# Patient Record
Sex: Male | Born: 1945 | Race: White | Hispanic: No | Marital: Single | Smoking: Former smoker
Health system: Southern US, Community
[De-identification: ages and names within clinical notes are randomized; demographics above are authoritative.]

## PROBLEM LIST (undated history)

## (undated) DIAGNOSIS — G459 Transient cerebral ischemic attack, unspecified: Secondary | ICD-10-CM

## (undated) DIAGNOSIS — J449 Chronic obstructive pulmonary disease, unspecified: Secondary | ICD-10-CM

## (undated) DIAGNOSIS — M4320 Fusion of spine, site unspecified: Secondary | ICD-10-CM

## (undated) DIAGNOSIS — I10 Essential (primary) hypertension: Secondary | ICD-10-CM

## (undated) DIAGNOSIS — I4891 Unspecified atrial fibrillation: Secondary | ICD-10-CM

---

## 2003-02-04 ENCOUNTER — Ambulatory Visit (HOSPITAL_COMMUNITY): Admission: RE | Admit: 2003-02-04 | Discharge: 2003-02-04 | Payer: Self-pay | Admitting: Dental General Practice

## 2012-02-13 ENCOUNTER — Emergency Department: Payer: Self-pay | Admitting: Emergency Medicine

## 2012-02-13 LAB — BASIC METABOLIC PANEL
Anion Gap: 9 (ref 7–16)
BUN: 20 mg/dL — ABNORMAL HIGH (ref 7–18)
Calcium, Total: 9 mg/dL (ref 8.5–10.1)
Chloride: 103 mmol/L (ref 98–107)
Co2: 25 mmol/L (ref 21–32)
Creatinine: 1.12 mg/dL (ref 0.60–1.30)
EGFR (African American): >60
EGFR (Non-African Amer.): >60
Glucose: 161 mg/dL — ABNORMAL HIGH (ref 65–99)
Osmolality: 280 (ref 275–301)
Potassium: 4 mmol/L (ref 3.5–5.1)
Sodium: 137 mmol/L (ref 136–145)

## 2012-02-13 LAB — CBC
HCT: 40.9 % (ref 40.0–52.0)
HGB: 13.7 g/dL (ref 13.0–18.0)
MCH: 30.8 pg (ref 26.0–34.0)
MCHC: 33.6 g/dL (ref 32.0–36.0)
MCV: 92 fL (ref 80–100)
Platelet: 278 10*3/uL (ref 150–440)
RBC: 4.46 10*6/uL (ref 4.40–5.90)
RDW: 13.1 % (ref 11.5–14.5)
WBC: 12.8 10*3/uL — ABNORMAL HIGH (ref 3.8–10.6)

## 2012-02-13 LAB — RAPID INFLUENZA A&B ANTIGENS

## 2012-02-13 LAB — TROPONIN I: Troponin-I: <0.02 ng/mL

## 2012-02-19 LAB — CULTURE, BLOOD (SINGLE)

## 2014-12-24 DIAGNOSIS — Z23 Encounter for immunization: Secondary | ICD-10-CM | POA: Diagnosis not present

## 2015-12-31 DIAGNOSIS — Z23 Encounter for immunization: Secondary | ICD-10-CM | POA: Diagnosis not present

## 2016-03-24 DIAGNOSIS — D2271 Melanocytic nevi of right lower limb, including hip: Secondary | ICD-10-CM | POA: Diagnosis not present

## 2016-03-24 DIAGNOSIS — D485 Neoplasm of uncertain behavior of skin: Secondary | ICD-10-CM | POA: Diagnosis not present

## 2016-03-24 DIAGNOSIS — D2272 Melanocytic nevi of left lower limb, including hip: Secondary | ICD-10-CM | POA: Diagnosis not present

## 2016-03-24 DIAGNOSIS — L821 Other seborrheic keratosis: Secondary | ICD-10-CM | POA: Diagnosis not present

## 2016-03-24 DIAGNOSIS — D2261 Melanocytic nevi of right upper limb, including shoulder: Secondary | ICD-10-CM | POA: Diagnosis not present

## 2016-04-19 DIAGNOSIS — L578 Other skin changes due to chronic exposure to nonionizing radiation: Secondary | ICD-10-CM | POA: Diagnosis not present

## 2016-04-19 DIAGNOSIS — L814 Other melanin hyperpigmentation: Secondary | ICD-10-CM | POA: Diagnosis not present

## 2016-04-19 DIAGNOSIS — L908 Other atrophic disorders of skin: Secondary | ICD-10-CM | POA: Diagnosis not present

## 2016-04-19 DIAGNOSIS — C44329 Squamous cell carcinoma of skin of other parts of face: Secondary | ICD-10-CM | POA: Diagnosis not present

## 2016-08-12 DIAGNOSIS — Z85828 Personal history of other malignant neoplasm of skin: Secondary | ICD-10-CM | POA: Diagnosis not present

## 2016-08-12 DIAGNOSIS — X32XXXA Exposure to sunlight, initial encounter: Secondary | ICD-10-CM | POA: Diagnosis not present

## 2016-08-12 DIAGNOSIS — L570 Actinic keratosis: Secondary | ICD-10-CM | POA: Diagnosis not present

## 2016-08-12 DIAGNOSIS — Z08 Encounter for follow-up examination after completed treatment for malignant neoplasm: Secondary | ICD-10-CM | POA: Diagnosis not present

## 2016-08-12 DIAGNOSIS — L821 Other seborrheic keratosis: Secondary | ICD-10-CM | POA: Diagnosis not present

## 2016-12-06 DIAGNOSIS — Z23 Encounter for immunization: Secondary | ICD-10-CM | POA: Diagnosis not present

## 2017-04-13 DIAGNOSIS — K644 Residual hemorrhoidal skin tags: Secondary | ICD-10-CM | POA: Diagnosis not present

## 2017-04-13 DIAGNOSIS — K648 Other hemorrhoids: Secondary | ICD-10-CM | POA: Diagnosis not present

## 2017-04-19 DIAGNOSIS — K648 Other hemorrhoids: Secondary | ICD-10-CM | POA: Diagnosis not present

## 2017-04-19 DIAGNOSIS — Z1211 Encounter for screening for malignant neoplasm of colon: Secondary | ICD-10-CM | POA: Diagnosis not present

## 2017-04-24 DIAGNOSIS — B029 Zoster without complications: Secondary | ICD-10-CM | POA: Diagnosis not present

## 2017-04-24 DIAGNOSIS — Z85828 Personal history of other malignant neoplasm of skin: Secondary | ICD-10-CM | POA: Diagnosis not present

## 2017-04-24 DIAGNOSIS — L821 Other seborrheic keratosis: Secondary | ICD-10-CM | POA: Diagnosis not present

## 2017-04-24 DIAGNOSIS — L309 Dermatitis, unspecified: Secondary | ICD-10-CM | POA: Diagnosis not present

## 2017-05-26 DIAGNOSIS — L239 Allergic contact dermatitis, unspecified cause: Secondary | ICD-10-CM | POA: Diagnosis not present

## 2017-05-26 DIAGNOSIS — B029 Zoster without complications: Secondary | ICD-10-CM | POA: Diagnosis not present

## 2017-06-24 ENCOUNTER — Emergency Department
Admission: EM | Admit: 2017-06-24 | Discharge: 2017-06-24 | Disposition: A | Discharge status: Home / Self Care | Payer: Medicare Other | Attending: Emergency Medicine | Admitting: Emergency Medicine

## 2017-06-24 ENCOUNTER — Other Ambulatory Visit: Payer: Self-pay

## 2017-06-24 ENCOUNTER — Emergency Department: Payer: Medicare Other

## 2017-06-24 DIAGNOSIS — R531 Weakness: Secondary | ICD-10-CM | POA: Diagnosis not present

## 2017-06-24 DIAGNOSIS — J181 Lobar pneumonia, unspecified organism: Secondary | ICD-10-CM | POA: Insufficient documentation

## 2017-06-24 DIAGNOSIS — J189 Pneumonia, unspecified organism: Secondary | ICD-10-CM | POA: Diagnosis not present

## 2017-06-24 DIAGNOSIS — R509 Fever, unspecified: Secondary | ICD-10-CM | POA: Diagnosis not present

## 2017-06-24 LAB — BASIC METABOLIC PANEL
Anion gap: 6 (ref 5–15)
BUN: 17 mg/dL (ref 6–20)
CO2: 25 mmol/L (ref 22–32)
Calcium: 8.8 mg/dL — ABNORMAL LOW (ref 8.9–10.3)
Chloride: 105 mmol/L (ref 101–111)
Creatinine, Ser: 0.96 mg/dL (ref 0.61–1.24)
GFR calc Af Amer: >60 mL/min (ref >60)
GFR calc non Af Amer: >60 mL/min (ref >60)
Glucose, Bld: 114 mg/dL — ABNORMAL HIGH (ref 65–99)
Potassium: 3.6 mmol/L (ref 3.5–5.1)
Sodium: 136 mmol/L (ref 135–145)

## 2017-06-24 LAB — CBC
HCT: 40.7 % (ref 40.0–52.0)
Hemoglobin: 14.2 g/dL (ref 13.0–18.0)
MCH: 31.4 pg (ref 26.0–34.0)
MCHC: 35 g/dL (ref 32.0–36.0)
MCV: 89.9 fL (ref 80.0–100.0)
Platelets: 205 10*3/uL (ref 150–440)
RBC: 4.53 MIL/uL (ref 4.40–5.90)
RDW: 15.1 % — ABNORMAL HIGH (ref 11.5–14.5)
WBC: 11.9 10*3/uL — ABNORMAL HIGH (ref 3.8–10.6)

## 2017-06-24 LAB — TROPONIN I: Troponin I: <0.03 ng/mL (ref <0.03)

## 2017-06-24 MED ORDER — LEVOFLOXACIN 750 MG PO TABS
750.0000 mg | ORAL_TABLET | Freq: Once | ORAL | Status: AC
  Administered 2017-06-24: 750 mg via ORAL
  Filled 2017-06-24: qty 1

## 2017-06-24 MED ORDER — LEVOFLOXACIN 750 MG PO TABS: 750.0000 mg | ORAL_TABLET | Freq: Every day | ORAL | 0 refills | Status: AC

## 2017-06-24 NOTE — ED Provider Notes (Signed)
[facility] Emergency Department Provider Note   ____________________________________________    I have reviewed the triage vital signs and the nursing notes.   HISTORY  Chief Complaint Weakness and Fever     HPI Jacob Velasquez is a 72 y.o. male who presents with complaints of fever and left mid upper back pain.  Patient reports he is developed pain in his left mid upper back which is aching in nature, he reports this is in a similar location to when he had shingles several months ago.  Came to the emergency department today when he developed a fever of up to 103.  Denies shortness of breath.  No cough.  No dysuria.  No abdominal pain.  No rash.   No past medical history on file.  There are no active problems to display for this patient.     Prior to Admission medications   Medication Sig Start Date End Date Taking? Authorizing Provider  levofloxacin (LEVAQUIN) 750 MG tablet Take 1 tablet (750 mg total) by mouth daily for 7 days. 06/24/17 07/01/17  Lavonia Drafts, MD     Allergies Patient has no known allergies.  No family history on file.  Social History Social History   Tobacco Use  . Smoking status: Not on file  Substance Use Topics  . Alcohol use: Not on file  . Drug use: Not on file    Review of Systems  Constitutional: Positive fever Eyes: No visual changes.  ENT: No sore throat. Cardiovascular: Denies chest pain. Respiratory: Denies shortness of breath. Gastrointestinal: No abdominal pain.  No nausea, no vomiting.   Genitourinary: Negative for dysuria. Musculoskeletal: As above Skin: Negative for rash. Neurological: Negative for headaches    ____________________________________________   PHYSICAL EXAM:  VITAL SIGNS: ED Triage Vitals  Enc Vitals Group     BP 06/24/17 1931 (!) 174/77     Pulse Rate 06/24/17 1931 63     Resp 06/24/17 1931 18     Temp 06/24/17 1931 100 F (37.8 C)     Temp Source 06/24/17 1931  Oral     SpO2 06/24/17 1931 94 %     Weight 06/24/17 1932 95.3 kg (210 lb)     Height 06/24/17 1932 1.88 m (6\' 2" )     Head Circumference --      Peak Flow --      Pain Score --      Pain Loc --      Pain Edu? --      Excl. in [place]? --     Constitutional: Alert and oriented. No acute distress. Pleasant and interactive  Nose: No congestion/rhinnorhea. Mouth/Throat: Mucous membranes are moist.   Neck:  Painless ROM Cardiovascular: Normal rate, regular rhythm. Grossly normal heart sounds.  Good peripheral circulation. Respiratory: Normal respiratory effort.  No retractions. Lungs CTAB. Gastrointestinal: Soft and nontender. No distention.  No CVA tenderness. Genitourinary: deferred Musculoskeletal: Back: Normal exam, no tenderness no rash.  No vertebral tenderness to palpation .no lower extremity tenderness nor edema.  Warm and well perfused Neurologic:  Normal speech and language. No gross focal neurologic deficits are appreciated.  Skin:  Skin is warm, dry and intact. No rash noted. Psychiatric: Mood and affect are normal. Speech and behavior are normal.  ____________________________________________   LABS (all labs ordered are listed, but only abnormal results are displayed)  Labs Reviewed  BASIC METABOLIC PANEL - Abnormal; Notable for the following components:      Result Value  Glucose, Bld 114 (*)    Calcium 8.8 (*)    All other components within normal limits  CBC - Abnormal; Notable for the following components:   WBC 11.9 (*)    RDW 15.1 (*)    All other components within normal limits  URINE CULTURE  TROPONIN I   ____________________________________________  EKG  ED ECG REPORT I, Lavonia Drafts, the attending physician, personally viewed and interpreted this ECG.  Date: 06/24/2017  Rhythm: normal sinus rhythm QRS Axis: normal Intervals: normal ST/T Wave abnormalities: normal Narrative Interpretation: no evidence of acute  ischemia  ____________________________________________  RADIOLOGY  Chest x-ray ____________________________________________   PROCEDURES  Procedure(s) performed: No  Procedures   Critical Care performed: No ____________________________________________   INITIAL IMPRESSION / ASSESSMENT AND PLAN / ED COURSE  Pertinent labs & imaging results that were available during my care of the patient were reviewed by me and considered in my medical decision making (see chart for details).  Patient presents with fever, mild weakness and left upper back pain.  Differential includes pneumonia, UTI, no evidence of rash or reemergence of shingles.   ----------------------------------------- 9:45 PM on 06/24/2017 -----------------------------------------  Patient well-appearing in no acute distress, no tachycardia, oxygen saturations in room greater than 95%, no tachypnea.  Pneumonia on exam, left lower lobe, will treat with p.o. Levaquin, Rx.  Strict return precautions discussed with patient and son they agree with plan   ____________________________________________   FINAL CLINICAL IMPRESSION(S) / ED DIAGNOSES  Final diagnoses:  Community acquired pneumonia of left lower lobe of lung ([place])        Note:  This document was prepared using Dragon voice recognition software and may include unintentional dictation errors.    Lavonia Drafts, MD 06/24/17 2146

## 2017-06-24 NOTE — ED Notes (Signed)
Patient transported to XR. 

## 2017-06-24 NOTE — ED Notes (Signed)
ED Provider at bedside. 

## 2017-06-24 NOTE — ED Triage Notes (Signed)
Patient reports history of chronic neck and back pain.  Patient reports diagnosed with shingles on left back approximately 2 months ago (no obvious rash noted).  Reports having pain to left back, fever and generalized weakness.

## 2017-06-27 LAB — URINE CULTURE: Culture: 30000 — AB

## 2017-12-18 DIAGNOSIS — Z23 Encounter for immunization: Secondary | ICD-10-CM | POA: Diagnosis not present

## 2018-10-10 DIAGNOSIS — I10 Essential (primary) hypertension: Secondary | ICD-10-CM | POA: Diagnosis present

## 2019-07-09 ENCOUNTER — Encounter: Payer: Self-pay | Admitting: Intensive Care

## 2019-07-09 ENCOUNTER — Emergency Department
Admission: EM | Admit: 2019-07-09 | Discharge: 2019-07-09 | Disposition: A | Discharge status: Home / Self Care | Payer: Medicare Other | Attending: Emergency Medicine | Admitting: Emergency Medicine

## 2019-07-09 ENCOUNTER — Other Ambulatory Visit: Payer: Self-pay

## 2019-07-09 DIAGNOSIS — Y999 Unspecified external cause status: Secondary | ICD-10-CM | POA: Insufficient documentation

## 2019-07-09 DIAGNOSIS — W458XXA Other foreign body or object entering through skin, initial encounter: Secondary | ICD-10-CM | POA: Diagnosis not present

## 2019-07-09 DIAGNOSIS — Y92019 Unspecified place in single-family (private) house as the place of occurrence of the external cause: Secondary | ICD-10-CM | POA: Insufficient documentation

## 2019-07-09 DIAGNOSIS — F17210 Nicotine dependence, cigarettes, uncomplicated: Secondary | ICD-10-CM | POA: Diagnosis not present

## 2019-07-09 DIAGNOSIS — Y93E5 Activity, floor mopping and cleaning: Secondary | ICD-10-CM | POA: Diagnosis not present

## 2019-07-09 DIAGNOSIS — Z23 Encounter for immunization: Secondary | ICD-10-CM | POA: Insufficient documentation

## 2019-07-09 DIAGNOSIS — I10 Essential (primary) hypertension: Secondary | ICD-10-CM | POA: Insufficient documentation

## 2019-07-09 DIAGNOSIS — S60458A Superficial foreign body of other finger, initial encounter: Secondary | ICD-10-CM | POA: Insufficient documentation

## 2019-07-09 DIAGNOSIS — S6991XA Unspecified injury of right wrist, hand and finger(s), initial encounter: Secondary | ICD-10-CM

## 2019-07-09 HISTORY — DX: Essential (primary) hypertension: I10

## 2019-07-09 MED ORDER — AMOXICILLIN-POT CLAVULANATE 875-125 MG PO TABS: 1.0000 | ORAL_TABLET | Freq: Two times a day (BID) | ORAL | 0 refills | Status: DC

## 2019-07-09 MED ORDER — LIDOCAINE HCL (PF) 1 % IJ SOLN
10.0000 mL | Freq: Once | INTRAMUSCULAR | Status: AC
  Administered 2019-07-09: 10 mL
  Filled 2019-07-09: qty 10

## 2019-07-09 MED ORDER — TETANUS-DIPHTH-ACELL PERTUSSIS 5-2.5-18.5 LF-MCG/0.5 IM SUSP
0.5000 mL | Freq: Once | INTRAMUSCULAR | Status: AC
  Administered 2019-07-09: 0.5 mL via INTRAMUSCULAR
  Filled 2019-07-09: qty 0.5

## 2019-07-09 NOTE — ED Triage Notes (Signed)
Patient has fish hook stuck in right hand, thumb.

## 2019-07-09 NOTE — ED Provider Notes (Signed)
[facility] Emergency Department Provider Note  ____________________________________________  Time seen: Approximately 8:28 PM  I have reviewed the triage vital signs and the nursing notes.   HISTORY  Chief Complaint Foreign Body in Skin    HPI Jacob Velasquez is a 74 y.o. male who presents the emergency department complaining of embedded fishhook in his right thumb.  Patient states that he was cleaning up a topical box when he accidentally stuck his finger with one of his worst.  Patient was unable to remove it at home.  He is unsure of his last tetanus shot.  No other injury or complaint.  Good range of motion to the digit at this time.         Past Medical History:  Diagnosis Date  . Hypertension     There are no problems to display for this patient.   History reviewed. No pertinent surgical history.  Prior to Admission medications   Medication Sig Start Date End Date Taking? Authorizing Provider  amoxicillin-clavulanate (AUGMENTIN) 875-125 MG tablet Take 1 tablet by mouth 2 (two) times daily. 07/09/19   Sheria Rosello, Charline Bills, PA-C    Allergies Patient has no known allergies.  History reviewed. No pertinent family history.  Social History Social History   Tobacco Use  . Smoking status: Current Some Day Smoker    Types: Cigarettes  . Smokeless tobacco: Never Used  Substance Use Topics  . Alcohol use: Never  . Drug use: Never     Review of Systems  Constitutional: No fever/chills Eyes: No visual changes. No discharge ENT: No upper respiratory complaints. Cardiovascular: no chest pain. Respiratory: no cough. No SOB. Gastrointestinal: No abdominal pain.  No nausea, no vomiting.  No diarrhea.  No constipation. Musculoskeletal: Embedded fishhook in the right thumb Skin: Negative for rash, abrasions, lacerations, ecchymosis. Neurological: Negative for headaches, focal weakness or numbness. 10-point ROS otherwise  negative.  ____________________________________________   PHYSICAL EXAM:  VITAL SIGNS: ED Triage Vitals  Enc Vitals Group     BP 07/09/19 1849 (!) 190/89     Pulse Rate 07/09/19 1849 63     Resp 07/09/19 1849 16     Temp 07/09/19 1849 98.9 F (37.2 C)     Temp Source 07/09/19 1849 Oral     SpO2 07/09/19 1849 98 %     Weight 07/09/19 1851 220 lb (99.8 kg)     Height 07/09/19 1851 6\' 2"  (1.88 m)     Head Circumference --      Peak Flow --      Pain Score 07/09/19 1851 0     Pain Loc --      Pain Edu? --      Excl. in [place]? --      Constitutional: Alert and oriented. Well appearing and in no acute distress. Eyes: Conjunctivae are normal. PERRL. EOMI. Head: Atraumatic. ENT:      Ears:       Nose: No congestion/rhinnorhea.      Mouth/Throat: Mucous membranes are moist.  Neck: No stridor.    Cardiovascular: Normal rate, regular rhythm. Normal S1 and S2.  Good peripheral circulation. Respiratory: Normal respiratory effort without tachypnea or retractions. Lungs CTAB. Good air entry to the bases with no decreased or absent breath sounds. Musculoskeletal: Full range of motion to all extremities. No gross deformities appreciated.  Visualization of the right hand reveals embedded trihook fishhook in the right thumb.  Dried blood around the puncture wound.  Full range of motion.  Sensation and capillary refill intact. Neurologic:  Normal speech and language. No gross focal neurologic deficits are appreciated.  Skin:  Skin is warm, dry and intact. No rash noted. Psychiatric: Mood and affect are normal. Speech and behavior are normal. Patient exhibits appropriate insight and judgement.   ____________________________________________   LABS (all labs ordered are listed, but only abnormal results are displayed)  Labs Reviewed - No data to display ____________________________________________  EKG   ____________________________________________  RADIOLOGY   No results  found.  ____________________________________________    PROCEDURES  Procedure(s) performed:    .Foreign Body Removal  Date/Time: 07/09/2019 8:30 PM Performed by: Darletta Moll, PA-C Authorized by: Darletta Moll, PA-C  Consent: Verbal consent obtained. Risks and benefits: risks, benefits and alternatives were discussed Consent given by: patient Patient understanding: patient states understanding of the procedure being performed Required items: required blood products, implants, devices, and special equipment available Patient identity confirmed: verbally with patient Time out: Immediately prior to procedure a "time out" was called to verify the correct patient, procedure, equipment, support staff and site/side marked as required. Body area: skin General location: upper extremity Location details: right thumb Anesthesia: local infiltration  Anesthesia: Local Anesthetic: lidocaine 2% without epinephrine Anesthetic total: 3 mL  Sedation: Patient sedated: no  Patient restrained: no Patient cooperative: yes Localization method: visualized Removal mechanism: forceps Tendon involvement: none Depth: subcutaneous Complexity: simple 1 objects recovered. Objects recovered: Fishhook Post-procedure assessment: foreign body removed Comments: Area was locally anesthetized using lidocaine 1% without epi.  Using forceps, fishhook was pushed through the skin until barb was visualized.  Using electric ring cutter barb was cut off.  Fishhook was then retracted and the area was thoroughly cleansed with Betadine and saline.  Adhesive bandage placed over top of wound.  Patient tolerated well no complications.  Capillary refill, sensation intact both prior to and status post procedure.      Medications  lidocaine (PF) (XYLOCAINE) 1 % injection 10 mL (has no administration in time range)  Tdap (BOOSTRIX) injection 0.5 mL (has no administration in time range)      ____________________________________________   INITIAL IMPRESSION / ASSESSMENT AND PLAN / ED COURSE  Pertinent labs & imaging results that were available during my care of the patient were reviewed by me and considered in my medical decision making (see chart for details).  Review of the [place] CSRS was performed in accordance of the [place] prior to dispensing any controlled drugs.           Patient's diagnosis is consistent with fishhook injury of the right thumb.  Patient presented to the emergency department complaining of injury to the right thumb.  Patient accidentally stuck his finger with a fishhook and this was still embedded on his arrival at the emergency department.  Tetanus shot updated.  Fishhook successfully removed without complication.  Wound care instructions discussed with patient.  Patient will be given antibiotics prophylactically.  Follow-up primary care as needed. Patient is given ED precautions to return to the ED for any worsening or new symptoms.     ____________________________________________  FINAL CLINICAL IMPRESSION(S) / ED DIAGNOSES  Final diagnoses:  Fish hook injury of finger of right hand, initial encounter      NEW MEDICATIONS STARTED DURING THIS VISIT:  ED Discharge Orders         Ordered    amoxicillin-clavulanate (AUGMENTIN) 875-125 MG tablet  2 times daily     07/09/19 2032  This chart was dictated using voice recognition software/Dragon. Despite best efforts to proofread, errors can occur which can change the meaning. Any change was purely unintentional.    Darletta Moll, PA-C 07/09/19 2033    Harvest Dark, MD 07/09/19 2256

## 2019-07-20 ENCOUNTER — Other Ambulatory Visit: Payer: Self-pay

## 2019-07-20 ENCOUNTER — Emergency Department
Admission: EM | Admit: 2019-07-20 | Discharge: 2019-07-20 | Disposition: A | Discharge status: Home / Self Care | Payer: Medicare Other | Attending: Emergency Medicine | Admitting: Emergency Medicine

## 2019-07-20 ENCOUNTER — Emergency Department: Payer: Medicare Other

## 2019-07-20 DIAGNOSIS — I10 Essential (primary) hypertension: Secondary | ICD-10-CM | POA: Diagnosis not present

## 2019-07-20 DIAGNOSIS — F17210 Nicotine dependence, cigarettes, uncomplicated: Secondary | ICD-10-CM | POA: Insufficient documentation

## 2019-07-20 DIAGNOSIS — R531 Weakness: Secondary | ICD-10-CM | POA: Diagnosis not present

## 2019-07-20 DIAGNOSIS — E860 Dehydration: Secondary | ICD-10-CM | POA: Diagnosis not present

## 2019-07-20 DIAGNOSIS — R5383 Other fatigue: Secondary | ICD-10-CM | POA: Diagnosis not present

## 2019-07-20 LAB — CBC
HCT: 42.6 % (ref 39.0–52.0)
Hemoglobin: 14.5 g/dL (ref 13.0–17.0)
MCH: 31 pg (ref 26.0–34.0)
MCHC: 34 g/dL (ref 30.0–36.0)
MCV: 91 fL (ref 80.0–100.0)
Platelets: 227 10*3/uL (ref 150–400)
RBC: 4.68 MIL/uL (ref 4.22–5.81)
RDW: 12.9 % (ref 11.5–15.5)
WBC: 8.6 10*3/uL (ref 4.0–10.5)
nRBC: 0 % (ref 0.0–0.2)

## 2019-07-20 LAB — BASIC METABOLIC PANEL
Anion gap: 8 (ref 5–15)
BUN: 21 mg/dL (ref 8–23)
CO2: 25 mmol/L (ref 22–32)
Calcium: 8.7 mg/dL — ABNORMAL LOW (ref 8.9–10.3)
Chloride: 102 mmol/L (ref 98–111)
Creatinine, Ser: 1.06 mg/dL (ref 0.61–1.24)
GFR calc Af Amer: >60 mL/min (ref >60)
GFR calc non Af Amer: >60 mL/min (ref >60)
Glucose, Bld: 122 mg/dL — ABNORMAL HIGH (ref 70–99)
Potassium: 3.7 mmol/L (ref 3.5–5.1)
Sodium: 135 mmol/L (ref 135–145)

## 2019-07-20 LAB — URINALYSIS, COMPLETE (UACMP) WITH MICROSCOPIC
Bacteria, UA: NONE SEEN
Bilirubin Urine: NEGATIVE
Glucose, UA: NEGATIVE mg/dL
Hgb urine dipstick: NEGATIVE
Ketones, ur: NEGATIVE mg/dL
Leukocytes,Ua: NEGATIVE
Nitrite: NEGATIVE
Protein, ur: NEGATIVE mg/dL
Specific Gravity, Urine: 1.028 (ref 1.005–1.030)
pH: 5 (ref 5.0–8.0)

## 2019-07-20 MED ORDER — SODIUM CHLORIDE 0.9% FLUSH: 3.0000 mL | Freq: Once | INTRAVENOUS | Status: DC

## 2019-07-20 MED ORDER — SODIUM CHLORIDE 0.9 % IV BOLUS
1000.0000 mL | Freq: Once | INTRAVENOUS | Status: AC
  Administered 2019-07-20: 1000 mL via INTRAVENOUS

## 2019-07-20 NOTE — ED Provider Notes (Signed)
[facility] Emergency Department Provider Note   ____________________________________________    I have reviewed the triage vital signs and the nursing notes.   HISTORY  Chief Complaint Fatigue     HPI Jacob Velasquez is a 74 y.o. male who presents with complaints of fatigue and weakness.  Patient reports he started feeling "sick "5 days ago.  He reports feeling fatigued and having some difficulty concentrating with decreased p.o. intake.  Did go to urgent care and there was concern for possible urinary tract infection so he was started on doxycycline.  He is only taken 1 to 2 days of that.  No fevers or chills.  No cough or chest pain.  No abdominal pain nausea or vomiting.  No rash.  Denies dysuria.  Past Medical History:  Diagnosis Date  . Hypertension     There are no problems to display for this patient.   No past surgical history on file.  Prior to Admission medications   Medication Sig Start Date End Date Taking? Authorizing Provider  amoxicillin-clavulanate (AUGMENTIN) 875-125 MG tablet Take 1 tablet by mouth 2 (two) times daily. 07/09/19   Cuthriell, Charline Bills, PA-C     Allergies Patient has no known allergies.  No family history on file.  Social History Social History   Tobacco Use  . Smoking status: Current Some Day Smoker    Types: Cigarettes  . Smokeless tobacco: Never Used  Substance Use Topics  . Alcohol use: Never  . Drug use: Never    Review of Systems  Constitutional: Weakness as above Eyes: No visual changes.  ENT: No sore throat. Cardiovascular: Denies chest pain. Respiratory: Denies shortness of breath. Gastrointestinal: No abdominal pain.   Genitourinary: Negative for dysuria. Musculoskeletal: Negative for back pain. Skin: Negative for rash. Neurological: Negative for headaches    ____________________________________________   PHYSICAL EXAM:  VITAL SIGNS: ED Triage Vitals  Enc Vitals Group      BP 07/20/19 0514 (!) 159/82     Pulse Rate 07/20/19 0514 63     Resp 07/20/19 0514 16     Temp 07/20/19 0514 98.6 F (37 C)     Temp Source 07/20/19 0514 Oral     SpO2 07/20/19 0514 93 %     Weight 07/20/19 0515 99.8 kg (220 lb 0.3 oz)     Height 07/20/19 0515 1.88 m (6\' 2" )     Head Circumference --      Peak Flow --      Pain Score 07/20/19 0515 0     Pain Loc --      Pain Edu? --      Excl. in [place]? --     Constitutional: Alert and oriented.  Eyes: Conjunctivae are normal.  Head: Atraumatic. Nose: No congestion/rhinnorhea. Mouth/Throat: Mucous membranes are moist.   Neck:  Painless ROM Cardiovascular: Normal rate, regular rhythm.  Good peripheral circulation. Respiratory: Normal respiratory effort.  No retractions. Lungs CTAB. No wheezing Gastrointestinal: Soft and nontender. No distention.   Genitourinary: deferred Musculoskeletal: Warm and well perfused Neurologic:  Normal speech and language. No gross focal neurologic deficits are appreciated. Mild upper extremity tremor bilaterally Skin:  Skin is warm, dry and intact. No rash Psychiatric: Mood and affect are normal. Speech and behavior are normal.  ____________________________________________   LABS (all labs ordered are listed, but only abnormal results are displayed)  Labs Reviewed  BASIC METABOLIC PANEL - Abnormal; Notable for the following components:  Result Value   Glucose, Bld 122 (*)    Calcium 8.7 (*)    All other components within normal limits  URINALYSIS, COMPLETE (UACMP) WITH MICROSCOPIC - Abnormal; Notable for the following components:   Color, Urine YELLOW (*)    APPearance HAZY (*)    All other components within normal limits  CBC  CBG MONITORING, ED   ____________________________________________  EKG  ED ECG REPORT I, Lavonia Drafts, the attending physician, personally viewed and interpreted this ECG.  Date: 07/20/2019  Rhythm: normal sinus rhythm QRS Axis: normal Intervals:  normal ST/T Wave abnormalities: normal Narrative Interpretation: no evidence of acute ischemia  ____________________________________________  RADIOLOGY  Chest x-ray reviewed by me, no infiltrate noted ____________________________________________   PROCEDURES  Procedure(s) performed: No  Procedures   Critical Care performed: No ____________________________________________   INITIAL IMPRESSION / ASSESSMENT AND PLAN / ED COURSE  Pertinent labs & imaging results that were available during my care of the patient were reviewed by me and considered in my medical decision making (see chart for details).  Patient overall well-appearing and in no acute distress. Complains of fatigue, weakness, decreased p.o. intake, suspicious for infectious etiology, possibly viral versus bacterial. Vital signs reassuring, mildly hypertensive. Afebrile. Has received 1 L of IV fluid in triage/subwait and reports he is already feeling better. No abdominal pain. Denies cough or chest pain. No rash. Denies dysuria. Was put on doxycycline and apparently for urinary tract infection which would be an unusual choice. He reports that his chest x-ray done few days ago demonstrated atelectasis.  Lab work today demonstrates normal white blood cell count, normal BUN to creatinine ratio, normal urinalysis. EKG unremarkable. Will obtain chest x-ray as pneumonia could certainly cause symptoms such as his.  Chest x-ray is quite reassuring.  Patient is actually up at the secretary's desk asking to leave as he feels much better and he is hungry  Given reassuring labs, x-ray, urinalysis, appropriate for discharge with close outpatient follow-up, return precautions discussed.      ____________________________________________   FINAL CLINICAL IMPRESSION(S) / ED DIAGNOSES  Final diagnoses:  Fatigue, unspecified type  Dehydration  Weakness        Note:  This document was prepared using Dragon voice recognition  software and may include unintentional dictation errors.   Lavonia Drafts, MD 07/20/19 1017

## 2019-07-20 NOTE — ED Triage Notes (Signed)
Pt to ED with family reporting pt has been fatigued, dizzy and with decreased appetite since Monday. Pt was put on antibiotics for a possible UTI but family and patient are stating they were never called about the results of the urinalysis and pt has continued to weaken and dehydrate. Family repeatedly states pt needs fluids. Pt reports nausea has contributed to the decreased PO intake but denies vomiting or diarrhea.

## 2019-11-21 ENCOUNTER — Other Ambulatory Visit: Payer: Self-pay | Admitting: Family Medicine

## 2019-11-21 ENCOUNTER — Ambulatory Visit
Admission: RE | Admit: 2019-11-21 | Discharge: 2019-11-21 | Disposition: A | Discharge status: Home / Self Care | Payer: Medicare Other | Source: Ambulatory Visit | Attending: Family Medicine | Admitting: Family Medicine

## 2019-11-21 ENCOUNTER — Other Ambulatory Visit: Payer: Self-pay

## 2019-11-21 DIAGNOSIS — M79662 Pain in left lower leg: Secondary | ICD-10-CM | POA: Insufficient documentation

## 2020-01-01 ENCOUNTER — Observation Stay
Admission: EM | Admit: 2020-01-01 | Discharge: 2020-01-02 | Disposition: A | Discharge status: Home / Self Care | Payer: Medicare Other | Attending: Internal Medicine | Admitting: Internal Medicine

## 2020-01-01 ENCOUNTER — Emergency Department: Payer: Medicare Other

## 2020-01-01 ENCOUNTER — Observation Stay: Payer: Medicare Other

## 2020-01-01 ENCOUNTER — Other Ambulatory Visit: Payer: Self-pay

## 2020-01-01 ENCOUNTER — Observation Stay
Admit: 2020-01-01 | Discharge: 2020-01-01 | Disposition: A | Discharge status: Home / Self Care | Payer: Medicare Other | Attending: Internal Medicine | Admitting: Internal Medicine

## 2020-01-01 DIAGNOSIS — G459 Transient cerebral ischemic attack, unspecified: Principal | ICD-10-CM

## 2020-01-01 DIAGNOSIS — I679 Cerebrovascular disease, unspecified: Secondary | ICD-10-CM | POA: Diagnosis present

## 2020-01-01 DIAGNOSIS — Z79899 Other long term (current) drug therapy: Secondary | ICD-10-CM | POA: Insufficient documentation

## 2020-01-01 DIAGNOSIS — R202 Paresthesia of skin: Secondary | ICD-10-CM

## 2020-01-01 DIAGNOSIS — Z20822 Contact with and (suspected) exposure to covid-19: Secondary | ICD-10-CM | POA: Diagnosis not present

## 2020-01-01 DIAGNOSIS — F17210 Nicotine dependence, cigarettes, uncomplicated: Secondary | ICD-10-CM | POA: Insufficient documentation

## 2020-01-01 DIAGNOSIS — I10 Essential (primary) hypertension: Secondary | ICD-10-CM | POA: Diagnosis not present

## 2020-01-01 DIAGNOSIS — M79609 Pain in unspecified limb: Secondary | ICD-10-CM

## 2020-01-01 LAB — COMPREHENSIVE METABOLIC PANEL
ALT: 15 U/L (ref 0–44)
AST: 22 U/L (ref 15–41)
Albumin: 3.8 g/dL (ref 3.5–5.0)
Alkaline Phosphatase: 52 U/L (ref 38–126)
Anion gap: 9 (ref 5–15)
BUN: 18 mg/dL (ref 8–23)
CO2: 24 mmol/L (ref 22–32)
Calcium: 9.1 mg/dL (ref 8.9–10.3)
Chloride: 104 mmol/L (ref 98–111)
Creatinine, Ser: 1.14 mg/dL (ref 0.61–1.24)
GFR, Estimated: >60 mL/min (ref >60)
Glucose, Bld: 148 mg/dL — ABNORMAL HIGH (ref 70–99)
Potassium: 4 mmol/L (ref 3.5–5.1)
Sodium: 137 mmol/L (ref 135–145)
Total Bilirubin: 1 mg/dL (ref 0.3–1.2)
Total Protein: 7.2 g/dL (ref 6.5–8.1)

## 2020-01-01 LAB — DIFFERENTIAL
Abs Immature Granulocytes: 0.01 10*3/uL (ref 0.00–0.07)
Basophils Absolute: 0.1 10*3/uL (ref 0.0–0.1)
Basophils Relative: 1 %
Eosinophils Absolute: 0.1 10*3/uL (ref 0.0–0.5)
Eosinophils Relative: 1 %
Immature Granulocytes: 0 %
Lymphocytes Relative: 16 %
Lymphs Abs: 1.3 10*3/uL (ref 0.7–4.0)
Monocytes Absolute: 0.8 10*3/uL (ref 0.1–1.0)
Monocytes Relative: 10 %
Neutro Abs: 5.5 10*3/uL (ref 1.7–7.7)
Neutrophils Relative %: 72 %

## 2020-01-01 LAB — CBC
HCT: 46.2 % (ref 39.0–52.0)
Hemoglobin: 15.3 g/dL (ref 13.0–17.0)
MCH: 30.7 pg (ref 26.0–34.0)
MCHC: 33.1 g/dL (ref 30.0–36.0)
MCV: 92.6 fL (ref 80.0–100.0)
Platelets: 256 10*3/uL (ref 150–400)
RBC: 4.99 MIL/uL (ref 4.22–5.81)
RDW: 13.3 % (ref 11.5–15.5)
WBC: 7.7 10*3/uL (ref 4.0–10.5)
nRBC: 0 % (ref 0.0–0.2)

## 2020-01-01 LAB — RESP PANEL BY RT-PCR (FLU A&B, COVID) ARPGX2
Influenza A by PCR: NEGATIVE
Influenza B by PCR: NEGATIVE
SARS Coronavirus 2 by RT PCR: NEGATIVE

## 2020-01-01 LAB — TROPONIN I (HIGH SENSITIVITY)
Troponin I (High Sensitivity): 7 ng/L (ref <18)
Troponin I (High Sensitivity): 9 ng/L (ref <18)

## 2020-01-01 LAB — APTT: aPTT: 29 seconds (ref 24–36)

## 2020-01-01 LAB — PROTIME-INR
INR: 1 (ref 0.8–1.2)
Prothrombin Time: 12.5 seconds (ref 11.4–15.2)

## 2020-01-01 MED ORDER — SODIUM CHLORIDE 0.9 % IV SOLN: INTRAVENOUS | Status: DC

## 2020-01-01 MED ORDER — NIFEDIPINE 10 MG PO CAPS
10.0000 mg | ORAL_CAPSULE | Freq: Three times a day (TID) | ORAL | Status: DC | PRN
  Filled 2020-01-01: qty 1

## 2020-01-01 MED ORDER — ACETAMINOPHEN 650 MG RE SUPP: 325.0000 mg | RECTAL | Status: DC | PRN

## 2020-01-01 MED ORDER — SODIUM CHLORIDE 0.9% FLUSH
3.0000 mL | Freq: Once | INTRAVENOUS | Status: AC
  Administered 2020-01-01: 3 mL via INTRAVENOUS

## 2020-01-01 MED ORDER — NIFEDIPINE 10 MG PO CAPS
10.0000 mg | ORAL_CAPSULE | Freq: Four times a day (QID) | ORAL | Status: DC | PRN
  Administered 2020-01-01: 10 mg via ORAL
  Filled 2020-01-01 (×3): qty 1

## 2020-01-01 MED ORDER — ASPIRIN 81 MG PO CHEW
324.0000 mg | CHEWABLE_TABLET | Freq: Once | ORAL | Status: AC
  Administered 2020-01-01: 324 mg via ORAL
  Filled 2020-01-01: qty 4

## 2020-01-01 MED ORDER — ACETAMINOPHEN 160 MG/5ML PO SOLN
650.0000 mg | ORAL | Status: DC | PRN
  Filled 2020-01-01: qty 20.3

## 2020-01-01 MED ORDER — STROKE: EARLY STAGES OF RECOVERY BOOK: Freq: Once | Status: DC

## 2020-01-01 MED ORDER — NICARDIPINE HCL 20 MG PO CAPS
20.0000 mg | ORAL_CAPSULE | Freq: Three times a day (TID) | ORAL | Status: DC | PRN
  Filled 2020-01-01: qty 1

## 2020-01-01 MED ORDER — SENNOSIDES-DOCUSATE SODIUM 8.6-50 MG PO TABS: 1.0000 | ORAL_TABLET | Freq: Every evening | ORAL | Status: DC | PRN

## 2020-01-01 MED ORDER — IOHEXOL 350 MG/ML SOLN
75.0000 mL | Freq: Once | INTRAVENOUS | Status: AC | PRN
  Administered 2020-01-01: 75 mL via INTRAVENOUS

## 2020-01-01 MED ORDER — ACETAMINOPHEN 325 MG PO TABS: 325.0000 mg | ORAL_TABLET | ORAL | Status: DC | PRN

## 2020-01-01 MED ORDER — ASPIRIN EC 81 MG PO TBEC
81.0000 mg | DELAYED_RELEASE_TABLET | Freq: Every day | ORAL | Status: DC
  Administered 2020-01-02: 81 mg via ORAL
  Filled 2020-01-01: qty 1

## 2020-01-01 NOTE — Consult Note (Signed)
Referring Physician: Dr. Tobie Poet    Chief Complaint: Acute onset of left face, arm and leg numbness with paresthesia  HPI: Jacob Velasquez is an 74 y.o. male with a PMHx of HTN and smoking who presented to the ED with acute onset of left sided paresthesias, starting while he was hunting yesterday. At first he thought the strap from equipment or a bag was causing the face and arm tingling that occurred suddenly as he was walking away from his hunting spot. He readjusted the strap, but then noticed that his LLE was also tingling. This made him worried enough to decide to be evaluated in the ED. He also endorsed balance issues yesterday morning that quickly resolved. He denies any facial weakness or limb weakness. Does not endorse any confusion, aphasia, dysarthria, vision loss or other acute symptoms. He does have a history of radiculopathic symptoms to right arm radiating from his neck, as well as lumbar radiculopathy.   Per ED Triage note, the patient was very anxious on arrival, stating "I think I may be having a heart attack"  Labs in the ED are unremarkable except for a glucose of 148.  CT head: 1. No evidence of acute intracranial abnormality. 2. Mild-to-moderate chronic small vessel ischemic changes in the cerebral white matter. 3. Mild chronic appearing paranasal sinusitis.  EKG: Normal sinus rhythm Rightward axis Borderline ECG  LSN: Yesterday tPA Given: No: Out of time window  Past Medical History:  Diagnosis Date  . Hypertension     History reviewed. No pertinent surgical history.  No family history on file. Social History:  reports that he has been smoking cigarettes. He has never used smokeless tobacco. He reports that he does not drink alcohol and does not use drugs.  Allergies: No Known Allergies  Medications:  Prior to Admission: (Not in a hospital admission)  Scheduled: .  stroke: mapping our early stages of recovery book   Does not apply Once   Continuous: .  sodium chloride 125 mL/hr at 01/01/20 1625    ROS: As per HPI. Comprehensive ROS otherwise negative.   Physical Examination: Blood pressure (!) 180/87, pulse (!) 58, temperature 98.2 F (36.8 C), temperature source Oral, resp. rate 15, height 6\' 2"  (1.88 m), weight 97.5 kg, SpO2 98 %.  HEENT: [place]/AT Lungs: Respirations unlabored Ext: No edema  Neurologic Examination: Mental Status: Alert, oriented, thought content appropriate.  Speech fluent without evidence of aphasia.  Able to follow all commands without difficulty. No dysarthria. Anxious affect in the context of his stroke-like symptoms.  Cranial Nerves: II:  Visual fields intact with no extinction to DSS. PERRL.  III,IV, VI: No ptosis. EOMI.  V: Facial temp sensation normal bilaterally; however, paresthesias are induced by tactile stimulation of his left face and ear.  VII: Smile symmetric VIII: hearing intact to conversation IX,X: No hypophonia or hoarseness XI: Symmetric shoulder shrug XII: Midline tongue extension  Motor: Right : Upper extremity   5/5    Left:     Upper extremity   5/5  Lower extremity   5/5     Lower extremity   5/5 No pronator drift.  Sensory: Temp and light touch intact throughout, bilaterally, but paresthesias are induced by tactile stimulation of his left forearm, upper arm and hand. No paresthesias to LLE.  Deep Tendon Reflexes:  1+ bilateral brachioradialis 2+ bilateral patellae and achilles Plantars: Right: downgoing   Left: downgoing Cerebellar: No ataxia with FNF bilaterally. Intention tremor to BUE is noted, which patient states  is chronic.  Gait: Deferred  Results for orders placed or performed during the hospital encounter of 01/01/20 (from the past 48 hour(s))  Protime-INR     Status: None   Collection Time: 01/01/20 12:53 PM  Result Value Ref Range   Prothrombin Time 12.5 11.4 - 15.2 seconds   INR 1.0 0.8 - 1.2    Comment: (NOTE) INR goal varies based on device and disease  states. Performed at [facility], [address] [place], [place] [address]   APTT     Status: None   Collection Time: 01/01/20 12:53 PM  Result Value Ref Range   aPTT 29 24 - 36 seconds    Comment: Performed at [facility], [address] [place], [place] [address]  CBC     Status: None   Collection Time: 01/01/20 12:53 PM  Result Value Ref Range   WBC 7.7 4.0 - 10.5 K/uL   RBC 4.99 4.22 - 5.81 MIL/uL   Hemoglobin 15.3 13.0 - 17.0 g/dL   HCT 46.2 39 - 52 %   MCV 92.6 80.0 - 100.0 fL   MCH 30.7 26.0 - 34.0 pg   MCHC 33.1 30.0 - 36.0 g/dL   RDW 13.3 11.5 - 15.5 %   Platelets 256 150 - 400 K/uL   nRBC 0.0 0.0 - 0.2 %    Comment: Performed at [facility], [address] [place], [place] [address]  Differential     Status: None   Collection Time: 01/01/20 12:53 PM  Result Value Ref Range   Neutrophils Relative % 72 %   Neutro Abs 5.5 1.7 - 7.7 K/uL   Lymphocytes Relative 16 %   Lymphs Abs 1.3 0.7 - 4.0 K/uL   Monocytes Relative 10 %   Monocytes Absolute 0.8 0.1 - 1.0 K/uL   Eosinophils Relative 1 %   Eosinophils Absolute 0.1 0.0 - 0.5 K/uL   Basophils Relative 1 %   Basophils Absolute 0.1 0.0 - 0.1 K/uL   Immature Granulocytes 0 %   Abs Immature Granulocytes 0.01 0.00 - 0.07 K/uL    Comment: Performed at [facility], [address] [place], [place] [address]  Comprehensive metabolic panel     Status: Abnormal   Collection Time: 01/01/20 12:53 PM  Result Value Ref Range   Sodium 137 135 - 145 mmol/L   Potassium 4.0 3.5 - 5.1 mmol/L   Chloride 104 98 - 111 mmol/L   CO2 24 22 - 32 mmol/L   Glucose, Bld 148 (H) 70 - 99 mg/dL    Comment: Glucose reference range applies only to samples taken after fasting for at least 8 hours.   BUN 18 8 - 23 mg/dL   Creatinine, Ser 1.14 0.61 - 1.24 mg/dL   Calcium 9.1 8.9 - 10.3 mg/dL   Total Protein 7.2 6.5 - 8.1 g/dL   Albumin 3.8 3.5 - 5.0 g/dL   AST 22 15 - 41 U/L   ALT 15 0 - 44 U/L    Alkaline Phosphatase 52 38 - 126 U/L   Total Bilirubin 1.0 0.3 - 1.2 mg/dL   GFR, Estimated >60 >60 mL/min    Comment: (NOTE) Calculated using the CKD-EPI Creatinine Equation (2021)    Anion gap 9 5 - 15    Comment: Performed at [facility], [address] [place], [place] [address]  Troponin I (High Sensitivity)     Status: None   Collection Time: 01/01/20 12:53 PM  Result Value  Ref Range   Troponin I (High Sensitivity) 7 <18 ng/L    Comment: (NOTE) Elevated high sensitivity troponin I (hsTnI) values and significant  changes across serial measurements may suggest ACS but many other  chronic and acute conditions are known to elevate hsTnI results.  Refer to the "Links" section for chest pain algorithms and additional  guidance. Performed at [facility], [address] [place], [place] [address]    CT HEAD WO CONTRAST  Result Date: 01/01/2020 CLINICAL DATA:  Tingling sensation from the left face to the left lower extremity with impaired balance since yesterday morning. No reported injury. EXAM: CT HEAD WITHOUT CONTRAST TECHNIQUE: Contiguous axial images were obtained from the base of the skull through the vertex without intravenous contrast. COMPARISON:  None. FINDINGS: Brain: No evidence of parenchymal hemorrhage or extra-axial fluid collection. No mass lesion, mass effect, or midline shift. No CT evidence of acute infarction. Nonspecific mild to moderate subcortical and periventricular white matter hypodensity, most in keeping with chronic small vessel ischemic change. Cerebral volume is age appropriate. No ventriculomegaly. Vascular: No acute abnormality. Skull: No evidence of calvarial fracture. Sinuses/Orbits: No fluid levels. Mild mucoperiosteal thickening in the ethmoidal air cells bilaterally. Other:  The mastoid air cells are unopacified. IMPRESSION: 1. No evidence of acute intracranial abnormality. 2. Mild-to-moderate chronic small vessel ischemic changes in  the cerebral white matter. 3. Mild chronic appearing paranasal sinusitis. Electronically Signed   By: Ilona Sorrel M.D.   On: 01/01/2020 13:10    Assessment: 74 y.o. male presenting with left face, arm and leg paresthesias since yesterday.  1. Exam reveals no objective focal deficit. However, there are some paresthesias to the LUE and left face that are inducible by tactile stimulation. DDx includes subacute right thalamic lacunar infarction.  2. CT head: No evidence of acute intracranial abnormality. Mild-to-moderate chronic small vessel ischemic changes in the cerebral white matter. 3. EKG: NSR. Rightward axis. Borderline ECG. 4. Stroke Risk Factors - Smoking and HTN  Recommendations: 1. HgbA1c, fasting lipid panel 2. MRI of the brain without contrast 3. CTA of head and neck 4. PT consult, OT consult, Speech consult 5. TTE 6. Prophylactic therapy-Start ASA 81 mg po qd.  7. Risk factor modification to include smoking cessation 8. Telemetry monitoring 9. Frequent neuro checks 10. Statin. 11. Modified permissive HTN protocol in the context of the patient's age. Treat SBP if > 180  @Electronically  signed: Dr. Kerney Elbe 01/01/2020, 2:59 PM

## 2020-01-01 NOTE — ED Provider Notes (Signed)
[facility] Emergency Department Provider Note    First MD Initiated Contact with Patient 01/01/20 1432     (approximate)  I have reviewed the triage vital signs and the nursing notes.   HISTORY  Chief Complaint Numbness    HPI Jacob Velasquez is a 74 y.o. male the history of hypertension as well as smoking history presents to the ER for 24 hours of left-sided numbness and tingling.  States he is also having some unsteady gait yesterday that quickly resolved.  States he went deer hunting yesterday was otherwise feeling fine early in the morning then noted the weakness on the left side of his body.  It did involve his face.  Has been struggling with issues with his neck in the past but never had tingling quite like this.  Denies any injury.  Denies any chest pain or pressure.    Past Medical History:  Diagnosis Date  . Hypertension    No family history on file. History reviewed. No pertinent surgical history. There are no problems to display for this patient.     Prior to Admission medications   Medication Sig Start Date End Date Taking? Authorizing Provider  amoxicillin-clavulanate (AUGMENTIN) 875-125 MG tablet Take 1 tablet by mouth 2 (two) times daily. 07/09/19   Cuthriell, Charline Bills, PA-C    Allergies Patient has no known allergies.    Social History Social History   Tobacco Use  . Smoking status: Current Some Day Smoker    Types: Cigarettes  . Smokeless tobacco: Never Used  Substance Use Topics  . Alcohol use: Never  . Drug use: Never    Review of Systems Patient denies headaches, rhinorrhea, blurry vision, numbness, shortness of breath, chest pain, edema, cough, abdominal pain, nausea, vomiting, diarrhea, dysuria, fevers, rashes or hallucinations unless otherwise stated above in HPI. ____________________________________________   PHYSICAL EXAM:  VITAL SIGNS: Vitals:   01/01/20 1241 01/01/20 1445  BP: (!) 163/79 (!) 180/87   Pulse: 70 (!) 58  Resp: 18 15  Temp: 98.2 F (36.8 C)   SpO2: 97% 98%    Constitutional: Alert and oriented.  Eyes: Conjunctivae are normal.  Head: Atraumatic. Nose: No congestion/rhinnorhea. Mouth/Throat: Mucous membranes are moist.   Neck: No stridor. Painless ROM.  Cardiovascular: Normal rate, regular rhythm. Grossly normal heart sounds.  Good peripheral circulation. Respiratory: Normal respiratory effort.  No retractions. Lungs CTAB. Gastrointestinal: Soft and nontender. No distention. No abdominal bruits. No CVA tenderness. Genitourinary:  Musculoskeletal: No lower extremity tenderness nor edema.  No joint effusions. Neurologic:  CN- intact.  No facial droop, Normal FNF.  Normal heel to shin.  Sensation intact bilaterally to light touch but with subject decrease to sensation on entire leftNormal speech and language. No gross focal neurologic deficits are appreciated. No gait instability.' Skin:  Skin is warm, dry and intact. No rash noted. Psychiatric: Mood and affect are normal. Speech and behavior are normal.  ____________________________________________   LABS (all labs ordered are listed, but only abnormal results are displayed)  Results for orders placed or performed during the hospital encounter of 01/01/20 (from the past 24 hour(s))  Protime-INR     Status: None   Collection Time: 01/01/20 12:53 PM  Result Value Ref Range   Prothrombin Time 12.5 11.4 - 15.2 seconds   INR 1.0 0.8 - 1.2  APTT     Status: None   Collection Time: 01/01/20 12:53 PM  Result Value Ref Range   aPTT 29 24 - 36  seconds  CBC     Status: None   Collection Time: 01/01/20 12:53 PM  Result Value Ref Range   WBC 7.7 4.0 - 10.5 K/uL   RBC 4.99 4.22 - 5.81 MIL/uL   Hemoglobin 15.3 13.0 - 17.0 g/dL   HCT 46.2 39 - 52 %   MCV 92.6 80.0 - 100.0 fL   MCH 30.7 26.0 - 34.0 pg   MCHC 33.1 30.0 - 36.0 g/dL   RDW 13.3 11.5 - 15.5 %   Platelets 256 150 - 400 K/uL   nRBC 0.0 0.0 - 0.2 %   Differential     Status: None   Collection Time: 01/01/20 12:53 PM  Result Value Ref Range   Neutrophils Relative % 72 %   Neutro Abs 5.5 1.7 - 7.7 K/uL   Lymphocytes Relative 16 %   Lymphs Abs 1.3 0.7 - 4.0 K/uL   Monocytes Relative 10 %   Monocytes Absolute 0.8 0.1 - 1.0 K/uL   Eosinophils Relative 1 %   Eosinophils Absolute 0.1 0.0 - 0.5 K/uL   Basophils Relative 1 %   Basophils Absolute 0.1 0.0 - 0.1 K/uL   Immature Granulocytes 0 %   Abs Immature Granulocytes 0.01 0.00 - 0.07 K/uL  Comprehensive metabolic panel     Status: Abnormal   Collection Time: 01/01/20 12:53 PM  Result Value Ref Range   Sodium 137 135 - 145 mmol/L   Potassium 4.0 3.5 - 5.1 mmol/L   Chloride 104 98 - 111 mmol/L   CO2 24 22 - 32 mmol/L   Glucose, Bld 148 (H) 70 - 99 mg/dL   BUN 18 8 - 23 mg/dL   Creatinine, Ser 1.14 0.61 - 1.24 mg/dL   Calcium 9.1 8.9 - 10.3 mg/dL   Total Protein 7.2 6.5 - 8.1 g/dL   Albumin 3.8 3.5 - 5.0 g/dL   AST 22 15 - 41 U/L   ALT 15 0 - 44 U/L   Alkaline Phosphatase 52 38 - 126 U/L   Total Bilirubin 1.0 0.3 - 1.2 mg/dL   GFR, Estimated >60 >60 mL/min   Anion gap 9 5 - 15  Troponin I (High Sensitivity)     Status: None   Collection Time: 01/01/20 12:53 PM  Result Value Ref Range   Troponin I (High Sensitivity) 7 <18 ng/L   ____________________________________________  EKG My review and personal interpretation at Time: 12:29   Indication: numbness  Rate: 80  Rhythm: sinus Axis: normal Other: nonspecific st abn, no stemi criteria ____________________________________________  RADIOLOGY  I personally reviewed all radiographic images ordered to evaluate for the above acute complaints and reviewed radiology reports and findings.  These findings were personally discussed with the patient.  Please see medical record for radiology report.  ____________________________________________   PROCEDURES  Procedure(s) performed:  Procedures    Critical Care performed:  no ____________________________________________   INITIAL IMPRESSION / ASSESSMENT AND PLAN / ED COURSE  Pertinent labs & imaging results that were available during my care of the patient were reviewed by me and considered in my medical decision making (see chart for details).   DDX: cva, tia, hypoglycemia, dehydration, electrolyte abnormality, dissection, sepsis   Teodoro Jeffreys is a 74 y.o. who presents to the ED with presentation as described above.  Patient's constellation of findings concerning for CVA versus TIA.  CT head was ordered which shows chronic microvascular changes he is known to be hypertensive.  Does have risk factors including smoking.  Have a  lower suspicion for ACS.  Does not seem consistent with dissection.  Does have any focal deficits he is well outside the window for TPA and his symptoms are fairly mild at this point however given his risk factors and symptoms do feel hospitalization clinically indicated.  I discussed case in consultation with neurology, Dr. Cheral Marker who does recommend obtaining CTA head and neck at this point for further management.  Have discussed with the patient and available family all diagnostics and treatments performed thus far and all questions were answered to the best of my ability. The patient demonstrates understanding and agreement with plan.      The patient was evaluated in Emergency Department today for the symptoms described in the history of present illness. He/she was evaluated in the context of the global COVID-19 pandemic, which necessitated consideration that the patient might be at risk for infection with the SARS-CoV-2 virus that causes COVID-19. Institutional protocols and algorithms that pertain to the evaluation of patients at risk for COVID-19 are in a state of rapid change based on information released by regulatory bodies including the CDC and federal and state organizations. These policies and algorithms were followed during  the patient's care in the ED.  As part of my medical decision making, I reviewed the following data within the [facility] notes reviewed and incorporated, Labs reviewed, notes from prior ED visits and [place] Controlled Substance Database   ____________________________________________   FINAL CLINICAL IMPRESSION(S) / ED DIAGNOSES  Final diagnoses:  Paresthesia      NEW MEDICATIONS STARTED DURING THIS VISIT:  New Prescriptions   No medications on file     Note:  This document was prepared using Dragon voice recognition software and may include unintentional dictation errors.    Merlyn Lot, MD 01/01/20 [phone]

## 2020-01-01 NOTE — ED Notes (Signed)
Pt transported to MRI 

## 2020-01-01 NOTE — H&P (Addendum)
History and Physical   Burch Marchuk LGX:[id number] DOB: [birth year] DOA: 01/01/2020  PCP: Baxter Hire, MD  Outpatient Specialists: None Patient coming from: Home  I have personally briefly reviewed patient's old medical records in [place].  Chief Concern: Numbness and tingling of his left side of face and extremities.  HPI: Jacob Velasquez is a 74 y.o. male with medical history significant for hypertension and lisinopril, tickborne fever, presented to the emergency department for chief concerns of numbness and tingling that started on 12/31/2019.  He reports the numbness and tingling are along the left side of face and upper and lower extremities that started about 10 AM on 12/31/19. He denies symptoms like this before. He denies changes to speech.  He describes the symptoms as persistent.  He reports the symptoms are not made better with his regular home medications.  Social history: son lives with him, infrequent smoking. Denies etoh and recreational drug use.   ROS: headache, vision chagnes, dysphagia, speech difficulty, nausea, vomiting, chest pain, shortness of breath, abdominal pain, dyusria, hematuria, diarrhea, blood in stool, musculoskeletal weakness of his extremities. He deneis swelling of his lower extremities.   ED Course: Discussed with ED provider, requesting admission for stroke work-up.  Review of Systems: As per HPI otherwise 10 point review of systems negative.   Assessment/Plan  Active Problems:   TIA (transient ischemic attack)   Stroke work-up -Neurology was consulted, appreciate Dr. Cheral Marker -Lipid panel and A1c -Echo -CTA of the head and neck -MRI of the brain without contrast -CT of the neck -Admit to observation with telemetry -PT, OT -Frequent neuro checks  Hypertension-hold home lisinopril 20 mg daily -Allow for permissive hypertension at this time -Nicardipine 20 mg every 8 hours as needed for SBP greater than 200  Chart  reviewed.   DVT prophylaxis: SCDs Code Status: full code  Diet: Heart diet Family Communication: Updated son at bedside Disposition Plan: Pending clinical course, likely 01/02/2020 Consults called: Neurology Admission status: Observation with telemetry  Past Medical History:  Diagnosis Date  . Hypertension    History reviewed. No pertinent surgical history.  Social History:  reports that he has been smoking cigarettes. He has never used smokeless tobacco. He reports that he does not drink alcohol and does not use drugs.  No Known Allergies No family history on file. Family history: Family history reviewed and mother had cva at age 11.   Prior to Admission medications   Medication Sig Start Date End Date Taking? Authorizing Provider  amoxicillin-clavulanate (AUGMENTIN) 875-125 MG tablet Take 1 tablet by mouth 2 (two) times daily. 07/09/19   Darletta Moll, PA-C   Physical Exam: Vitals:   01/01/20 1241 01/01/20 1242 01/01/20 1445  BP: (!) 163/79  (!) 180/87  Pulse: 70  (!) 58  Resp: 18  15  Temp: 98.2 F (36.8 C)    TempSrc: Oral    SpO2: 97%  98%  Weight:  97.5 kg   Height:  6\' 2"  (1.88 m)    Constitutional: appears age-appropriate, NAD, calm, comfortable Eyes: PERRL, lids and conjunctivae normal ENMT: Mucous membranes are moist. Posterior pharynx clear of any exudate or lesions. Age-appropriate dentition. Hearing appropriate Neck: normal, supple, no masses, no thyromegaly Respiratory: clear to auscultation bilaterally, no wheezing, no crackles. Normal respiratory effort. No accessory muscle use.  Cardiovascular: Regular rate and rhythm, no murmurs / rubs / gallops. No extremity edema. 2+ pedal pulses. No carotid bruits.  Abdomen: no tenderness, no masses palpated, no  hepatosplenomegaly. Bowel sounds positive.  Musculoskeletal: no clubbing / cyanosis. No joint deformity upper and lower extremities. Good ROM, no contractures, no atrophy. Normal muscle tone.  Skin: no  rashes, lesions, ulcers. No induration Neurologic: Sensation intact. Strength 5/5 in all 4.  Psychiatric: Normal judgment and insight. Alert and oriented x 3. Normal mood.   EKG: Independently reviewed, showing normal sinus rhythm rate of 81, qtc 434  Chest x-ray on Admission: Personally reviewed and I agree with radiologist reading as below.  CT HEAD WO CONTRAST  Result Date: 01/01/2020 CLINICAL DATA:  Tingling sensation from the left face to the left lower extremity with impaired balance since yesterday morning. No reported injury. EXAM: CT HEAD WITHOUT CONTRAST TECHNIQUE: Contiguous axial images were obtained from the base of the skull through the vertex without intravenous contrast. COMPARISON:  None. FINDINGS: Brain: No evidence of parenchymal hemorrhage or extra-axial fluid collection. No mass lesion, mass effect, or midline shift. No CT evidence of acute infarction. Nonspecific mild to moderate subcortical and periventricular white matter hypodensity, most in keeping with chronic small vessel ischemic change. Cerebral volume is age appropriate. No ventriculomegaly. Vascular: No acute abnormality. Skull: No evidence of calvarial fracture. Sinuses/Orbits: No fluid levels. Mild mucoperiosteal thickening in the ethmoidal air cells bilaterally. Other:  The mastoid air cells are unopacified. IMPRESSION: 1. No evidence of acute intracranial abnormality. 2. Mild-to-moderate chronic small vessel ischemic changes in the cerebral white matter. 3. Mild chronic appearing paranasal sinusitis. Electronically Signed   By: Ilona Sorrel M.D.   On: 01/01/2020 13:10   Labs on Admission: I have personally reviewed following labs  CBC: Recent Labs  Lab 01/01/20 1253  WBC 7.7  NEUTROABS 5.5  HGB 15.3  HCT 46.2  MCV 92.6  PLT 989   Basic Metabolic Panel: Recent Labs  Lab 01/01/20 1253  NA 137  K 4.0  CL 104  CO2 24  GLUCOSE 148*  BUN 18  CREATININE 1.14  CALCIUM 9.1   GFR: Estimated Creatinine  Clearance: 66.1 mL/min (by C-G formula based on SCr of 1.14 mg/dL). Liver Function Tests: Recent Labs  Lab 01/01/20 1253  AST 22  ALT 15  ALKPHOS 52  BILITOT 1.0  PROT 7.2  ALBUMIN 3.8   Coagulation Profile: Recent Labs  Lab 01/01/20 1253  INR 1.0   Urine analysis:    Component Value Date/Time   COLORURINE YELLOW (A) 07/20/2019 0525   APPEARANCEUR HAZY (A) 07/20/2019 0525   LABSPEC 1.028 07/20/2019 0525   PHURINE 5.0 07/20/2019 0525   GLUCOSEU NEGATIVE 07/20/2019 0525   HGBUR NEGATIVE 07/20/2019 0525   BILIRUBINUR NEGATIVE 07/20/2019 0525   KETONESUR NEGATIVE 07/20/2019 0525   PROTEINUR NEGATIVE 07/20/2019 0525   NITRITE NEGATIVE 07/20/2019 0525   LEUKOCYTESUR NEGATIVE 07/20/2019 0525   Jacob Velasquez D.O. [facility]  If 12AM-7AM, please contact overnight-coverage provider If 7AM-7PM, please contact day coverage provider [email or web address]  01/01/2020, 3:26 PM

## 2020-01-01 NOTE — ED Triage Notes (Addendum)
Pt c/o feeling tingling sensation down the whole left side from face down to leg with some balance issues since yesterday morning. No noted facial droop, pt has a steady gait to triage. Skin is warm and dry,. Pt is very anxious on arrival, "I think I maybe having a heart attack"

## 2020-01-01 NOTE — ED Notes (Signed)
Pt called in the WR with no response 

## 2020-01-02 DIAGNOSIS — R202 Paresthesia of skin: Secondary | ICD-10-CM

## 2020-01-02 DIAGNOSIS — I10 Essential (primary) hypertension: Secondary | ICD-10-CM | POA: Diagnosis not present

## 2020-01-02 DIAGNOSIS — I639 Cerebral infarction, unspecified: Secondary | ICD-10-CM | POA: Diagnosis not present

## 2020-01-02 LAB — ECHOCARDIOGRAM COMPLETE
Area-P 1/2: 3.21 cm2
Height: 74 in
S' Lateral: 2.71 cm
Weight: 3440 oz

## 2020-01-02 LAB — LIPID PANEL
Cholesterol: 221 mg/dL — ABNORMAL HIGH (ref 0–200)
HDL: 49 mg/dL (ref >40)
LDL Cholesterol: 154 mg/dL — ABNORMAL HIGH (ref 0–99)
Total CHOL/HDL Ratio: 4.5 RATIO
Triglycerides: 90 mg/dL (ref <150)
VLDL: 18 mg/dL (ref 0–40)

## 2020-01-02 LAB — HEMOGLOBIN A1C
Hgb A1c MFr Bld: 5.3 % (ref 4.8–5.6)
Mean Plasma Glucose: 105.41 mg/dL

## 2020-01-02 MED ORDER — ASPIRIN 81 MG PO TBEC
81.0000 mg | DELAYED_RELEASE_TABLET | Freq: Every day | ORAL | 11 refills | Status: DC
Start: 2020-01-03 — End: 2020-08-01

## 2020-01-02 MED ORDER — AMLODIPINE BESYLATE 5 MG PO TABS: 5.0000 mg | ORAL_TABLET | Freq: Every day | ORAL | 0 refills | Status: DC

## 2020-01-02 MED ORDER — ATORVASTATIN CALCIUM 40 MG PO TABS: 40.0000 mg | ORAL_TABLET | Freq: Every day | ORAL | 0 refills | Status: AC

## 2020-01-02 NOTE — Evaluation (Signed)
Physical Therapy Evaluation Patient Details Name: Jacob Velasquez MRN: [id number] DOB: 1945-09-23 Today's Date: 01/02/2020   History of Present Illness  Pt admitted to [facility] on 01/01/20 for c/o L sided N/T from face to leg with increased balance issues, no facial droop, and steady gait to triage. Findings concerning for CVA vs. TIA. Significant PMH includes: HTN, lumbar radiculopathy, cervical radiculopathy to RUE, and hx of smoking. Imaging revealed chronic microvascular changes, arterial stenosis, subcentimeter acute infarct of R pons, and L centrum semiovale.    Clinical Impression  Pt is a 74 year old M admitted to hospital on 01/01/20 for c/o L sided N/T and increased balance issues secondary to subacute infarct. At baseline, pt was active and independent with all ADL's, IADL's, driving, and community ambulation without AD. Pt presents with c/o N/T in L side of face and LUE, mild impulsivity, c/o intermittent L calf pain, and decreased high level balance; otherwise pt with adequate strength, balance, and endurance required for safe functional mobility. Due to deficits, pt required Ind for safety for bed mobility/transfers, and supervision for safety for ambulation without AD. L calf pain likely due to muscle strain as pt reported increased soreness with passive DF and previous DVT testing was negative; pt educated regarding soleus and gastroc stretching for at home management. Pt demonstrated increased difficulty with tandem balance bil, and tandem balance with dual tasking indicating impaired high level balance. However, 4-item DGI score of 10/12 indicates pt is at low risk of falls (> or equal to 10/12 indicating low risk of falls). Due to pt being near baseline, he is not an appropriate candidate for skilled acute PT needs at this time; pleas re-consult with any changes in status. Pt appropriate to ambulate with nursing or therapy mobility tech while hospitalized for maintenance of independence.  Will recommend DC home and referral to OPPT to address high level balance deficits.    Follow Up Recommendations Outpatient PT    Equipment Recommendations  None recommended by PT    Recommendations for Other Services       Precautions / Restrictions Precautions Precautions: None Restrictions Weight Bearing Restrictions: No      Mobility  Bed Mobility Overal bed mobility: Independent                  Transfers Overall transfer level: Independent                  Ambulation/Gait Ambulation/Gait assistance: Supervision Gait Distance (Feet): 200 Feet Assistive device: None       General Gait Details: Pt required supervision for safety due to impulsivity to ambulate without AD. Pt demonstrated fast cadence and reciprocal gait. 4-item DGI score of 10/12 indicates pt is at low risk of falls.  Stairs            Wheelchair Mobility    Modified Rankin (Stroke Patients Only)       Balance Overall balance assessment: Independent Sitting-balance support: No upper extremity supported;Feet supported Sitting balance-Leahy Scale: Normal Sitting balance - Comments: Normal balance; able to don/doff shoes while seated EOB, demonstrating anterior weight shift without LOB   Standing balance support: During functional activity;No upper extremity supported Standing balance-Leahy Scale: Good Standing balance comment: Good standing balance without AD.     Tandem Stance - Right Leg: 30 (increased lateral sway with intermittent arm raise) Tandem Stance - Left Leg: 30 (increased lateral sway with intermittent arm raise)     High level balance activites: Turns;Sudden stops;Head turns  High Level Balance Comments: No LOB with high level balance. Pt able to reach outside BOS with BUE in multiple planes (with narrow BOS) without LOB, demonstrating adequate weight shift. Increased difficulty with tandem stance and alternating finger to nose.             Pertinent  Vitals/Pain Pain Assessment: No/denies pain (tightness and intermittent pain in L calf)    Home Living Family/patient expects to be discharged to:: Private residence Living Arrangements: Children (adult son) Available Help at Discharge: Family;Available 24 hours/day Type of Home: House Home Access: Level entry     Home Layout: One level Home Equipment: None      Prior Function Level of Independence: Independent         Comments: Pt indep and active, no AD, enjoys hunting, no falls     Hand Dominance   Dominant Hand: Right    Extremity/Trunk Assessment   Upper Extremity Assessment Upper Extremity Assessment: Defer to OT evaluation LUE Deficits / Details: impaired sensation - tingling in LUE; strength and coordination WNL    Lower Extremity Assessment Lower Extremity Assessment: Overall WFL for tasks assessed (tightness in L calf, no TTP)    Cervical / Trunk Assessment Cervical / Trunk Assessment: Normal  Communication   Communication: No difficulties  Cognition Arousal/Alertness: Awake/alert Behavior During Therapy: WFL for tasks assessed/performed;Agitated Overall Cognitive Status: Within Functional Limits for tasks assessed                                 General Comments: mildly impulsive; agitated wanting to speak with neurologist about why he's still in the hospital      General Comments      Exercises Other Exercises Other Exercises: Pt able to ambulate ~274f without AD and supervision for safety. Mild impulsivity noted, with decreased ability for command following with head turns to the L/R. Other Exercises: Pt able to perform narrow BOS and tandem balance without LOB; increased lateral sway with tandem stance and intermittent arm raise for balance correction. Pt able to reach outside BOS with BUE in multiple planes without LOB, demonstrating adequate weight shift. Increased difficulty in tandem stance with alternating finger to nose.    Assessment/Plan    PT Assessment All further PT needs can be met in the next venue of care  PT Problem List Decreased balance       PT Treatment Interventions      PT Goals (Current goals can be found in the Care Plan section)  Acute Rehab PT Goals Patient Stated Goal: get sensation back to L side and go home PT Goal Formulation: With patient/family Time For Goal Achievement: 01/16/20 Potential to Achieve Goals: Good    Frequency     Barriers to discharge        Co-evaluation               AM-PAC PT "6 Clicks" Mobility  Outcome Measure Help needed turning from your back to your side while in a flat bed without using bedrails?: None Help needed moving from lying on your back to sitting on the side of a flat bed without using bedrails?: None Help needed moving to and from a bed to a chair (including a wheelchair)?: None Help needed standing up from a chair using your arms (e.g., wheelchair or bedside chair)?: None Help needed to walk in hospital room?: A Little Help needed climbing 3-5 steps with a railing? :  A Little 6 Click Score: 22    End of Session Equipment Utilized During Treatment: Gait belt Activity Tolerance: Patient tolerated treatment well Patient left: in bed;with call bell/phone within reach;with family/visitor present Nurse Communication: Mobility status;Other (comment) (concerns for DC and hospital stay) PT Visit Diagnosis: Unsteadiness on feet (R26.81)    Time: 7169-6789 PT Time Calculation (min) (ACUTE ONLY): 28 min   Charges:   PT Evaluation $PT Eval Low Complexity: 1 Low         Herminio Commons, PT, DPT 12:13 PM,01/02/20

## 2020-01-02 NOTE — Evaluation (Signed)
Occupational Therapy Evaluation Patient Details Name: Jacob Velasquez MRN: [id number] DOB: 1945/10/12 Today's Date: 01/02/2020    History of Present Illness Pt admitted to [facility] on 01/01/20 for c/o L sided N/T from face to leg with increased balance issues, no facial droop, and steady gait to triage. Findings concerning for CVA vs. TIA. Significant PMH includes: HTN, lumbar radiculopathy, cervical radiculopathy to RUE, and hx of smoking. Imaging revealed chronic microvascular changes, arterial stenosis, subcentimeter acute infarct of R pons, and L centrum semiovale.   Clinical Impression   Pt seen for OT evaluation this date. Prior to hospital admission, pt was independent in all aspects of ADL/IADL and active, enjoying hunting. Pt denies falls history in past 12 months. Pt lives in a 1 story home with level entry and reports his adult son also lives with him. No visual, cognitive, strength, or coordination deficits appreciated with testing. Pt with mild increased time/effort to perform LB dressing which he endorses is baseline 2/2 chronic low back issues. 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 Pt does endorse tingling in LUE and L side of face that continues but he believes is improving. Pt independent with mobility and ADL tasks. Do not anticipate difficulty with IADL tasks. No additional skilled OT needs identified. Will sign off. Please re-consult if additional OT needs arise.    Follow Up Recommendations  No OT follow up    Equipment Recommendations  None recommended by OT    Recommendations for Other Services       Precautions / Restrictions Precautions Precautions: None Restrictions Weight Bearing Restrictions: No      Mobility Bed Mobility Overal bed mobility: Independent                  Transfers Overall transfer level: Independent                    Balance Overall balance assessment: Needs assistance Sitting-balance support: No upper extremity  supported;Feet supported Sitting balance-Leahy Scale: Normal     Standing balance support: No upper extremity supported Standing balance-Leahy Scale: Good                             ADL either performed or assessed with clinical judgement   ADL Overall ADL's : Independent;Modified independent                                       General ADL Comments: mild difficulty/time noted for donning socks/shoes - pt attributes this to his chronic "low back issues", still able to perform without assist, mild intention tremor noted while pt was eating breakfast but did not functionally impair self feeding     Vision Baseline Vision/History: Wears glasses Wears Glasses: Reading only Patient Visual Report: No change from baseline Vision Assessment?: No apparent visual deficits     Perception     Praxis      Pertinent Vitals/Pain Pain Assessment: No/denies pain (pt endorses mild "tightness" in L calf but denies pain)     Hand Dominance Right   Extremity/Trunk Assessment Upper Extremity Assessment Upper Extremity Assessment: LUE deficits/detail (mild intention tremor bilaterally, pt reports at baseline, father and grandfather had this) LUE Deficits / Details: impaired sensation - tingling in LUE; strength and coordination WNL   Lower Extremity Assessment Lower Extremity Assessment: Overall WFL for tasks assessed (no sensation, coordination,  or strength deficits appreciated; pt does endorse "tightness" in L calf)   Cervical / Trunk Assessment Cervical / Trunk Assessment: Normal   Communication Communication Communication: No difficulties   Cognition Arousal/Alertness: Awake/alert Behavior During Therapy: WFL for tasks assessed/performed Overall Cognitive Status: Within Functional Limits for tasks assessed                                 General Comments: mildly impulsive   General Comments       Exercises     Shoulder Instructions       Home Living Family/patient expects to be discharged to:: Private residence Living Arrangements: Children (adult son) Available Help at Discharge: Family Type of Home: House Home Access: Level entry     Home Layout: One level     Bathroom Shower/Tub: Tub/shower unit;Walk-in shower   Bathroom Toilet: Standard     Home Equipment: None          Prior Functioning/Environment Level of Independence: Independent        Comments: Pt indep and active, no AD, enjoys hunting, no falls        OT Problem List: Impaired balance (sitting and/or standing);Impaired sensation      OT Treatment/Interventions:      OT Goals(Current goals can be found in the care plan section) Acute Rehab OT Goals Patient Stated Goal: get sensation back to L side and go home OT Goal Formulation: All assessment and education complete, DC therapy  OT Frequency:     Barriers to D/C:            Co-evaluation              AM-PAC OT "6 Clicks" Daily Activity     Outcome Measure Help from another person eating meals?: None Help from another person taking care of personal grooming?: None Help from another person toileting, which includes using toliet, bedpan, or urinal?: None Help from another person bathing (including washing, rinsing, drying)?: None Help from another person to put on and taking off regular upper body clothing?: None Help from another person to put on and taking off regular lower body clothing?: None 6 Click Score: 24   End of Session Equipment Utilized During Treatment: Gait belt Nurse Communication: Mobility status;Other (comment) (leads from heart monitor were not attached)  Activity Tolerance: Patient tolerated treatment well Patient left: in bed;with call bell/phone within reach (seated EOB for breakfast)  OT Visit Diagnosis: Other abnormalities of gait and mobility (R26.89)                Time: 7425-9563 OT Time Calculation (min): 23 min Charges:  OT General  Charges $OT Visit: 1 Visit OT Evaluation $OT Eval Low Complexity: 1 Low  Jeni Salles, MPH, MS, OTR/L ascom [phone] 01/02/20, 9:54 AM

## 2020-01-02 NOTE — Progress Notes (Signed)
Chart reviewed, Pt visited. Dressed and ready for discharge. Pt denies swallowing or speech deficits. Speech fluent and appropriate. Pt still with tingling on left side of body and face but reports no difficulty with face sensation. Reminded to eat and drink slowly. No St follow up at discharge.

## 2020-01-02 NOTE — Progress Notes (Signed)
Subjective: The patient has had partial resolution of his left sided paresthesias overnight. He would likel to be discharged home as soon as possible.   Objective: Current vital signs: BP (!) 170/79 (BP Location: Right Arm)   Pulse (!) 48   Temp 98.9 F (37.2 C) (Oral)   Resp 18   Ht 6\' 2"  (1.88 m)   Wt 97.5 kg   SpO2 97%   BMI 27.60 kg/m  Vital signs in last 24 hours: Temp:  [97.9 F (36.6 C)-98.9 F (37.2 C)] 98.9 F (37.2 C) (12/02 0811) Pulse Rate:  [48-70] 48 (12/02 0811) Resp:  [15-20] 18 (12/02 0811) BP: (155-217)/(75-99) 170/79 (12/02 0811) SpO2:  [97 %-100 %] 97 % (12/02 0811) Weight:  [97.5 kg] 97.5 kg (12/01 1242)  Intake/Output from previous day: 12/01 0701 - 12/02 0700 In: 1512.9 [P.O.:240; I.V.:1272.9] Out: -  Intake/Output this shift: No intake/output data recorded. Nutritional status:  Diet Order            Diet Heart Room service appropriate? Yes; Fluid consistency: Thin  Diet effective now                HEENT: [place]/AT Lungs: Respirations unlabored Ext: No edema  Neurologic Exam: Ment: Alert and oriented. Speech is normal.  CN: EOMI. Face symmetric. Nonfocal Motor: Moves all 4 extremities equally with full strength.  Sensory: Still with some paresthesias to LUE and left face, triggered by tactile stimulation   Lab Results: Results for orders placed or performed during the hospital encounter of 01/01/20 (from the past 48 hour(s))  Protime-INR     Status: None   Collection Time: 01/01/20 12:53 PM  Result Value Ref Range   Prothrombin Time 12.5 11.4 - 15.2 seconds   INR 1.0 0.8 - 1.2    Comment: (NOTE) INR goal varies based on device and disease states. Performed at [facility], [address] [place], [place] [address]   APTT     Status: None   Collection Time: 01/01/20 12:53 PM  Result Value Ref Range   aPTT 29 24 - 36 seconds    Comment: Performed at [facility], [address] [place], [place] [address]  CBC      Status: None   Collection Time: 01/01/20 12:53 PM  Result Value Ref Range   WBC 7.7 4.0 - 10.5 K/uL   RBC 4.99 4.22 - 5.81 MIL/uL   Hemoglobin 15.3 13.0 - 17.0 g/dL   HCT 46.2 39 - 52 %   MCV 92.6 80.0 - 100.0 fL   MCH 30.7 26.0 - 34.0 pg   MCHC 33.1 30.0 - 36.0 g/dL   RDW 13.3 11.5 - 15.5 %   Platelets 256 150 - 400 K/uL   nRBC 0.0 0.0 - 0.2 %    Comment: Performed at [facility], [address] [place], [place] [address]  Differential     Status: None   Collection Time: 01/01/20 12:53 PM  Result Value Ref Range   Neutrophils Relative % 72 %   Neutro Abs 5.5 1.7 - 7.7 K/uL   Lymphocytes Relative 16 %   Lymphs Abs 1.3 0.7 - 4.0 K/uL   Monocytes Relative 10 %   Monocytes Absolute 0.8 0.1 - 1.0 K/uL   Eosinophils Relative 1 %   Eosinophils Absolute 0.1 0.0 - 0.5 K/uL   Basophils Relative 1 %   Basophils Absolute 0.1 0.0 - 0.1 K/uL   Immature Granulocytes 0 %   Abs Immature Granulocytes  0.01 0.00 - 0.07 K/uL    Comment: Performed at [facility], [address] [place], [place] [address]  Comprehensive metabolic panel     Status: Abnormal   Collection Time: 01/01/20 12:53 PM  Result Value Ref Range   Sodium 137 135 - 145 mmol/L   Potassium 4.0 3.5 - 5.1 mmol/L   Chloride 104 98 - 111 mmol/L   CO2 24 22 - 32 mmol/L   Glucose, Bld 148 (H) 70 - 99 mg/dL    Comment: Glucose reference range applies only to samples taken after fasting for at least 8 hours.   BUN 18 8 - 23 mg/dL   Creatinine, Ser 1.14 0.61 - 1.24 mg/dL   Calcium 9.1 8.9 - 10.3 mg/dL   Total Protein 7.2 6.5 - 8.1 g/dL   Albumin 3.8 3.5 - 5.0 g/dL   AST 22 15 - 41 U/L   ALT 15 0 - 44 U/L   Alkaline Phosphatase 52 38 - 126 U/L   Total Bilirubin 1.0 0.3 - 1.2 mg/dL   GFR, Estimated >60 >60 mL/min    Comment: (NOTE) Calculated using the CKD-EPI Creatinine Equation (2021)    Anion gap 9 5 - 15    Comment: Performed at [facility], [address] [place], [place] [address]   Troponin I (High Sensitivity)     Status: None   Collection Time: 01/01/20 12:53 PM  Result Value Ref Range   Troponin I (High Sensitivity) 7 <18 ng/L    Comment: (NOTE) Elevated high sensitivity troponin I (hsTnI) values and significant  changes across serial measurements may suggest ACS but many other  chronic and acute conditions are known to elevate hsTnI results.  Refer to the "Links" section for chest pain algorithms and additional  guidance. Performed at [facility], [address] [place], [place] [address]   Troponin I (High Sensitivity)     Status: None   Collection Time: 01/01/20  2:41 PM  Result Value Ref Range   Troponin I (High Sensitivity) 9 <18 ng/L    Comment: (NOTE) Elevated high sensitivity troponin I (hsTnI) values and significant  changes across serial measurements may suggest ACS but many other  chronic and acute conditions are known to elevate hsTnI results.  Refer to the "Links" section for chest pain algorithms and additional  guidance. Performed at [facility], [address] [place], [place] [address]   Resp Panel by RT-PCR (Flu A&B, Covid) Nasopharyngeal Swab     Status: None   Collection Time: 01/01/20  3:41 PM   Specimen: Nasopharyngeal Swab; Nasopharyngeal(NP) swabs in vial transport medium  Result Value Ref Range   SARS Coronavirus 2 by RT PCR NEGATIVE NEGATIVE    Comment: (NOTE) SARS-CoV-2 target nucleic acids are NOT DETECTED.  The SARS-CoV-2 RNA is generally detectable in upper respiratory specimens during the acute phase of infection. The lowest concentration of SARS-CoV-2 viral copies this assay can detect is 138 copies/mL. A negative result does not preclude SARS-Cov-2 infection and should not be used as the sole basis for treatment or other patient management decisions. A negative result may occur with  improper specimen collection/handling, submission of specimen other than nasopharyngeal swab, presence of  viral mutation(s) within the areas targeted by this assay, and inadequate number of viral copies(<138 copies/mL). A negative result must be combined with clinical observations, patient history, and epidemiological information. The expected result is Negative.  Fact Sheet for Patients:  EntrepreneurPulse.com.au  Fact Sheet for Healthcare  Providers:  IncredibleEmployment.be  This test is no t yet approved or cleared by the [place] and  has been authorized for detection and/or diagnosis of SARS-CoV-2 by FDA under an Emergency Use Authorization (EUA). This EUA will remain  in effect (meaning this test can be used) for the duration of the COVID-19 declaration under Section 564(b)(1) of the Act, 21 U.S.C.section 360bbb-3(b)(1), unless the authorization is terminated  or revoked sooner.       Influenza A by PCR NEGATIVE NEGATIVE   Influenza B by PCR NEGATIVE NEGATIVE    Comment: (NOTE) The Xpert Xpress SARS-CoV-2/FLU/RSV plus assay is intended as an aid in the diagnosis of influenza from Nasopharyngeal swab specimens and should not be used as a sole basis for treatment. Nasal washings and aspirates are unacceptable for Xpert Xpress SARS-CoV-2/FLU/RSV testing.  Fact Sheet for Patients: EntrepreneurPulse.com.au  Fact Sheet for Healthcare Providers: IncredibleEmployment.be  This test is not yet approved or cleared by the [place] FDA and has been authorized for detection and/or diagnosis of SARS-CoV-2 by FDA under an Emergency Use Authorization (EUA). This EUA will remain in effect (meaning this test can be used) for the duration of the COVID-19 declaration under Section 564(b)(1) of the Act, 21 U.S.C. section 360bbb-3(b)(1), unless the authorization is terminated or revoked.  Performed at [facility], [address] [place], [place] [address]   Lipid panel     Status: Abnormal    Collection Time: 01/02/20  5:32 AM  Result Value Ref Range   Cholesterol 221 (H) 0 - 200 mg/dL   Triglycerides 90 <150 mg/dL   HDL 49 >40 mg/dL   Total CHOL/HDL Ratio 4.5 RATIO   VLDL 18 0 - 40 mg/dL   LDL Cholesterol 154 (H) 0 - 99 mg/dL    Comment:        Total Cholesterol/HDL:CHD Risk Coronary Heart Disease Risk Table                     Men   Women  1/2 Average Risk   3.4   3.3  Average Risk       5.0   4.4  2 X Average Risk   9.6   7.1  3 X Average Risk  23.4   11.0        Use the calculated Patient Ratio above and the CHD Risk Table to determine the patient's CHD Risk.        ATP III CLASSIFICATION (LDL):  <100     mg/dL   Optimal  100-129  mg/dL   Near or Above                    Optimal  130-159  mg/dL   Borderline  160-189  mg/dL   High  >190     mg/dL   Very High Performed at [facility], [address] [place], [place] [address]     Recent Results (from the past 240 hour(s))  Resp Panel by RT-PCR (Flu A&B, Covid) Nasopharyngeal Swab     Status: None   Collection Time: 01/01/20  3:41 PM   Specimen: Nasopharyngeal Swab; Nasopharyngeal(NP) swabs in vial transport medium  Result Value Ref Range Status   SARS Coronavirus 2 by RT PCR NEGATIVE NEGATIVE Final    Comment: (NOTE) SARS-CoV-2 target nucleic acids are NOT DETECTED.  The SARS-CoV-2 RNA is generally detectable in upper respiratory specimens during the acute phase of infection. The lowest concentration of  SARS-CoV-2 viral copies this assay can detect is 138 copies/mL. A negative result does not preclude SARS-Cov-2 infection and should not be used as the sole basis for treatment or other patient management decisions. A negative result may occur with  improper specimen collection/handling, submission of specimen other than nasopharyngeal swab, presence of viral mutation(s) within the areas targeted by this assay, and inadequate number of viral copies(<138 copies/mL). A negative result must be  combined with clinical observations, patient history, and epidemiological information. The expected result is Negative.  Fact Sheet for Patients:  EntrepreneurPulse.com.au  Fact Sheet for Healthcare Providers:  IncredibleEmployment.be  This test is no t yet approved or cleared by the [place] FDA and  has been authorized for detection and/or diagnosis of SARS-CoV-2 by FDA under an Emergency Use Authorization (EUA). This EUA will remain  in effect (meaning this test can be used) for the duration of the COVID-19 declaration under Section 564(b)(1) of the Act, 21 U.S.C.section 360bbb-3(b)(1), unless the authorization is terminated  or revoked sooner.       Influenza A by PCR NEGATIVE NEGATIVE Final   Influenza B by PCR NEGATIVE NEGATIVE Final    Comment: (NOTE) The Xpert Xpress SARS-CoV-2/FLU/RSV plus assay is intended as an aid in the diagnosis of influenza from Nasopharyngeal swab specimens and should not be used as a sole basis for treatment. Nasal washings and aspirates are unacceptable for Xpert Xpress SARS-CoV-2/FLU/RSV testing.  Fact Sheet for Patients: EntrepreneurPulse.com.au  Fact Sheet for Healthcare Providers: IncredibleEmployment.be  This test is not yet approved or cleared by the [place] FDA and has been authorized for detection and/or diagnosis of SARS-CoV-2 by FDA under an Emergency Use Authorization (EUA). This EUA will remain in effect (meaning this test can be used) for the duration of the COVID-19 declaration under Section 564(b)(1) of the Act, 21 U.S.C. section 360bbb-3(b)(1), unless the authorization is terminated or revoked.  Performed at [facility], [address] [place], [place] [address]     Lipid Panel Recent Labs    01/02/20 0532  CHOL 221*  TRIG 90  HDL 49  CHOLHDL 4.5  VLDL 18  LDLCALC 154*    Studies/Results: CT Angio Head W or Wo  Contrast  Result Date: 01/01/2020 CLINICAL DATA:  Neuro deficit, acute stroke suspected. EXAM: CT ANGIOGRAPHY HEAD AND NECK TECHNIQUE: Multidetector CT imaging of the head and neck was performed using the standard protocol during bolus administration of intravenous contrast. Multiplanar CT image reconstructions and MIPs were obtained to evaluate the vascular anatomy. Carotid stenosis measurements (when applicable) are obtained utilizing NASCET criteria, using the distal internal carotid diameter as the denominator. CONTRAST:  17mL OMNIPAQUE IOHEXOL 350 MG/ML SOLN COMPARISON:  Same day CT head. FINDINGS: CTA NECK FINDINGS Aortic arch: Calcific atherosclerosis. Great vessel origins are patent. Right carotid system: Mixed calcific and noncalcific atherosclerosis at the bifurcation without greater than 50% narrowing Left carotid system: Atherosclerosis of the common carotid artery. Predominately noncalcified atherosclerosis at the carotid bifurcation with approximately 40-50% stenosis of the proximal left internal carotid artery. Vertebral arteries: Severe narrowing of the proximal right vertebral artery secondary to atherosclerosis. Severe narrowing of the left vertebral artery origin secondary to calcific and noncalcific atherosclerosis. The remainder of bilateral vertebral arteries are opacified. Skeleton: Large bridging osteophytes in the cervical spine. Other neck: No mass or suspicious adenopathy. Upper chest: No acute findings. Review of the MIP images confirms the above findings CTA HEAD FINDINGS Anterior circulation: Bilateral cavernous carotid atherosclerosis without evidence of greater  than 50% stenosis. There is early bifurcation of the left MCA with moderate proximal inferior M2 MCA branch stenosis. Right MCA and bilateral ACAs are patent without evidence of hemodynamically significant proximal stenosis. No aneurysm. Posterior circulation: Bilateral intradural vertebral arteries and the basilar artery are  within normal limits. Severe stenosis of the proximal right P1 PCA and the left P2 PCA. No aneurysm. Venous sinuses: As permitted by contrast timing, patent. Review of the MIP images confirms the above findings IMPRESSION: 1.  No evidence of large vessel occlusion. 2.  Multifocal atherosclerotic narrowing, including: -  Severe stenosis of the proximal right P1 PCA and the left P2 PCA. - Early branching of the left MCA with moderate stenosis of a proximal left M2 MCA branch. - Severe narrowing of the proximal right vertebral artery and the left vertebral artery origin secondary to atherosclerosis. - Approximately 40-50% stenosis of the proximal left internal carotid artery in the neck. Electronically Signed   By: Margaretha Sheffield MD   On: 01/01/2020 16:38   CT HEAD WO CONTRAST  Result Date: 01/01/2020 CLINICAL DATA:  Tingling sensation from the left face to the left lower extremity with impaired balance since yesterday morning. No reported injury. EXAM: CT HEAD WITHOUT CONTRAST TECHNIQUE: Contiguous axial images were obtained from the base of the skull through the vertex without intravenous contrast. COMPARISON:  None. FINDINGS: Brain: No evidence of parenchymal hemorrhage or extra-axial fluid collection. No mass lesion, mass effect, or midline shift. No CT evidence of acute infarction. Nonspecific mild to moderate subcortical and periventricular white matter hypodensity, most in keeping with chronic small vessel ischemic change. Cerebral volume is age appropriate. No ventriculomegaly. Vascular: No acute abnormality. Skull: No evidence of calvarial fracture. Sinuses/Orbits: No fluid levels. Mild mucoperiosteal thickening in the ethmoidal air cells bilaterally. Other:  The mastoid air cells are unopacified. IMPRESSION: 1. No evidence of acute intracranial abnormality. 2. Mild-to-moderate chronic small vessel ischemic changes in the cerebral white matter. 3. Mild chronic appearing paranasal sinusitis.  Electronically Signed   By: Ilona Sorrel M.D.   On: 01/01/2020 13:10   CT Angio Neck W and/or Wo Contrast  Result Date: 01/01/2020 CLINICAL DATA:  Neuro deficit, acute stroke suspected. EXAM: CT ANGIOGRAPHY HEAD AND NECK TECHNIQUE: Multidetector CT imaging of the head and neck was performed using the standard protocol during bolus administration of intravenous contrast. Multiplanar CT image reconstructions and MIPs were obtained to evaluate the vascular anatomy. Carotid stenosis measurements (when applicable) are obtained utilizing NASCET criteria, using the distal internal carotid diameter as the denominator. CONTRAST:  76mL OMNIPAQUE IOHEXOL 350 MG/ML SOLN COMPARISON:  Same day CT head. FINDINGS: CTA NECK FINDINGS Aortic arch: Calcific atherosclerosis. Great vessel origins are patent. Right carotid system: Mixed calcific and noncalcific atherosclerosis at the bifurcation without greater than 50% narrowing Left carotid system: Atherosclerosis of the common carotid artery. Predominately noncalcified atherosclerosis at the carotid bifurcation with approximately 40-50% stenosis of the proximal left internal carotid artery. Vertebral arteries: Severe narrowing of the proximal right vertebral artery secondary to atherosclerosis. Severe narrowing of the left vertebral artery origin secondary to calcific and noncalcific atherosclerosis. The remainder of bilateral vertebral arteries are opacified. Skeleton: Large bridging osteophytes in the cervical spine. Other neck: No mass or suspicious adenopathy. Upper chest: No acute findings. Review of the MIP images confirms the above findings CTA HEAD FINDINGS Anterior circulation: Bilateral cavernous carotid atherosclerosis without evidence of greater than 50% stenosis. There is early bifurcation of the left MCA with moderate proximal inferior M2  MCA branch stenosis. Right MCA and bilateral ACAs are patent without evidence of hemodynamically significant proximal stenosis. No  aneurysm. Posterior circulation: Bilateral intradural vertebral arteries and the basilar artery are within normal limits. Severe stenosis of the proximal right P1 PCA and the left P2 PCA. No aneurysm. Venous sinuses: As permitted by contrast timing, patent. Review of the MIP images confirms the above findings IMPRESSION: 1.  No evidence of large vessel occlusion. 2.  Multifocal atherosclerotic narrowing, including: -  Severe stenosis of the proximal right P1 PCA and the left P2 PCA. - Early branching of the left MCA with moderate stenosis of a proximal left M2 MCA branch. - Severe narrowing of the proximal right vertebral artery and the left vertebral artery origin secondary to atherosclerosis. - Approximately 40-50% stenosis of the proximal left internal carotid artery in the neck. Electronically Signed   By: Margaretha Sheffield MD   On: 01/01/2020 16:38   MR ANGIO HEAD WO CONTRAST  Result Date: 01/01/2020 CLINICAL DATA:  Left-sided paresthesias EXAM: MRI HEAD WITHOUT CONTRAST MRA HEAD WITHOUT CONTRAST TECHNIQUE: Multiplanar, multiecho pulse sequences of the brain and surrounding structures were obtained without intravenous contrast. Angiographic images of the head were obtained using MRA technique without contrast. COMPARISON:  None. FINDINGS: MRI HEAD Brain: There is a 7 mm area of restricted diffusion at lateral aspect of the dorsal right pons. Subcentimeter focus of diffusion hyperintensity in the left centrum semiovale reflects T2 shine through. Post susceptibility along the lateral left lentiform nucleus likely reflects chronic microhemorrhage. Patchy and confluent areas of T2 hyperintensity in the supratentorial and pontine white matter nonspecific but probably reflect moderate chronic microvascular ischemic changes. There is no intracranial mass or mass effect. There is no hydrocephalus or extra-axial fluid collection. Vascular: Major vessel flow voids at the skull base are preserved. Skull and upper  cervical spine: Normal marrow signal is preserved. Sinuses/Orbits: Ethmoid mucosal thickening. Orbits are unremarkable. Other: Sella is unremarkable.  Mastoid air cells are clear. MRA HEAD Intracranial internal carotid arteries are patent with atherosclerotic irregularity. Middle and anterior cerebral arteries are patent. Moderate stenosis the mid left M1 MCA. Intracranial vertebral arteries, basilar artery, posterior cerebral arteries are patent. Stenosis of bilateral P2 PCAs. 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 Moderate chronic microvascular ischemic changes. No proximal intracranial vessel occlusion. Intracranial atherosclerosis. Electronically Signed   By: Macy Mis M.D.   On: 01/01/2020 17:58   MR BRAIN WO CONTRAST  Result Date: 01/01/2020 CLINICAL DATA:  Left-sided paresthesias EXAM: MRI HEAD WITHOUT CONTRAST MRA HEAD WITHOUT CONTRAST TECHNIQUE: Multiplanar, multiecho pulse sequences of the brain and surrounding structures were obtained without intravenous contrast. Angiographic images of the head were obtained using MRA technique without contrast. COMPARISON:  None. FINDINGS: MRI HEAD Brain: There is a 7 mm area of restricted diffusion at lateral aspect of the dorsal right pons. Subcentimeter focus of diffusion hyperintensity in the left centrum semiovale reflects T2 shine through. Post susceptibility along the lateral left lentiform nucleus likely reflects chronic microhemorrhage. Patchy and confluent areas of T2 hyperintensity in the supratentorial and pontine white matter nonspecific but probably reflect moderate chronic microvascular ischemic changes. There is no intracranial mass or mass effect. There is no hydrocephalus or extra-axial fluid collection. Vascular: Major vessel flow voids at the skull base are preserved. Skull and upper cervical spine: Normal marrow signal is preserved.  Sinuses/Orbits: Ethmoid mucosal thickening. Orbits are unremarkable. Other: Sella is unremarkable.  Mastoid air cells are clear. MRA HEAD Intracranial internal carotid arteries are patent with atherosclerotic irregularity. Middle and anterior cerebral arteries are patent. Moderate stenosis the mid left M1 MCA. Intracranial vertebral arteries, basilar artery, posterior cerebral arteries are patent. Stenosis of bilateral P2 PCAs. 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 Moderate chronic microvascular ischemic changes. No proximal intracranial vessel occlusion. Intracranial atherosclerosis. Electronically Signed   By: Macy Mis M.D.   On: 01/01/2020 17:58   ECHOCARDIOGRAM COMPLETE  Result Date: 01/02/2020    ECHOCARDIOGRAM REPORT   Patient Name:   Jacob Velasquez Date of Exam: 01/01/2020 Medical Rec #:  [id number]          Height:       74.0 in Accession #:    [id number]         Weight:       215.0 lb Date of Birth:  1945/11/28          BSA:          2.241 m Patient Age:    74 years           BP:           157/83 mmHg Patient Gender: M                  HR:           59 bpm. Exam Location:  [facility] Procedure: 2D Echo, Cardiac Doppler and Color Doppler Indications:     I163.9 Stroke  History:         Patient has no prior history of Echocardiogram examinations.                  Risk Factors:Hypertension.  Sonographer:     Wilford Sports Rodgers-Jones Referring Phys:  [id number] AMY N COX Diagnosing Phys: Serafina Royals MD IMPRESSIONS  1. Left ventricular ejection fraction, by estimation, is 60 to 65%. The left ventricle has normal function. The left ventricle has no regional wall motion abnormalities. Left ventricular diastolic parameters were normal.  2. Right ventricular systolic function is normal. The right ventricular size is normal.  3. The mitral valve is normal in structure. Mild mitral valve regurgitation.  4. The  aortic valve is normal in structure. Aortic valve regurgitation is trivial.  5. Aortic dilatation noted. 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 FINDINGS  Left Ventricle: Left ventricular ejection fraction, by estimation, is 60 to 65%. The left ventricle has normal function. The left ventricle has no regional wall motion abnormalities. The left ventricular internal cavity size was normal in size. There is  no left ventricular hypertrophy. Left ventricular diastolic parameters were normal. Right Ventricle: The right ventricular size is normal. No increase in right ventricular wall thickness. Right ventricular systolic function is normal. Left Atrium: Left atrial size was normal in size. Right Atrium: Right atrial size was normal in size. Pericardium: There is no evidence of pericardial effusion. Mitral Valve: The mitral valve is normal in structure. Mild mitral valve regurgitation. Tricuspid Valve: The tricuspid valve is normal in structure. Tricuspid valve regurgitation is trivial. Aortic Valve: The aortic valve is normal in structure. Aortic valve regurgitation is trivial. Pulmonic Valve: The pulmonic valve was normal in structure. Pulmonic valve regurgitation is not visualized. Aorta: Aortic dilatation noted. 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 IAS/Shunts:  No atrial level shunt detected by color flow Doppler.  LEFT VENTRICLE PLAX 2D LVIDd:         4.87 cm  Diastology LVIDs:         2.71 cm  LV e' medial:    7.62 cm/s LV PW:         1.09 cm  LV E/e' medial:  11.8 LV IVS:        1.05 cm  LV e' lateral:   8.81 cm/s LVOT diam:     2.20 cm  LV E/e' lateral: 10.2 LV SV:         110 LV SV Index:   49 LVOT Area:     3.80 cm  RIGHT VENTRICLE             IVC RV Basal diam:  4.59 cm     IVC diam: 1.52 cm RV S prime:     22.40 cm/s TAPSE (M-mode): 3.2 cm LEFT ATRIUM             Index       RIGHT ATRIUM           Index LA  diam:        4.60 cm 2.05 cm/m  RA Area:     23.10 cm LA Vol (A2C):   77.6 ml 34.62 ml/m RA Volume:   80.60 ml  35.96 ml/m LA Vol (A4C):   56.7 ml 25.30 ml/m LA Biplane Vol: 67.4 ml 30.07 ml/m  AORTIC VALVE LVOT Vmax:   133.00 cm/s LVOT Vmean:  84.100 cm/s LVOT VTI:    0.289 m  AORTA Ao Root diam: 3.80 cm Ao Asc diam:  4.30 cm MITRAL VALVE MV Area (PHT): 3.21 cm    SHUNTS MV Decel Time: 236 msec    Systemic VTI:  0.29 m MV E velocity: 90.00 cm/s  Systemic Diam: 2.20 cm MV A velocity: 86.60 cm/s MV E/A ratio:  1.04 Serafina Royals MD Electronically signed by Serafina Royals MD Signature Date/Time: 01/02/2020/6:51:31 AM    Final     Medications:  Scheduled: .  stroke: mapping our early stages of recovery book   Does not apply Once  . aspirin EC  81 mg Oral Daily   Continuous: . sodium chloride 125 mL/hr at 01/02/20 0643   TTE:  1. Left ventricular ejection fraction, by estimation, is 60 to 65%. The  left ventricle has normal function. The left ventricle has no regional  wall motion abnormalities. Left ventricular diastolic parameters were  normal.  2. Right ventricular systolic function is normal. The right ventricular  size is normal.  3. The mitral valve is normal in structure. Mild mitral valve  regurgitation.  4. The aortic valve is normal in structure. Aortic valve regurgitation is  trivial.  5. Aortic dilatation noted. 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   Assessment: 74 y.o. male presenting with left face, arm and leg paresthesias since yesterday.  1. Exam yesterday revealed no objective focal deficit. However, there were some paresthesias to the LUE and left face that are inducible by tactile stimulation. DDx includes subacute right thalamic lacunar infarction. Exam today reveals improved dysesthesia on the left.  2. CT head: No evidence of acute intracranial abnormality. Mild-to-moderate chronic small vessel ischemic  changes in the cerebral white matter. 3. EKG: NSR. Rightward axis. Borderline ECG. 4. CTA of head and neck: No evidence of large vessel occlusion. Multifocal atherosclerotic narrowing,  including severe stenosis of the proximal right P1 PCA and the left P2 PCA, moderate stenosis of a proximal left M2 MCA branch, severe narrowing of the proximal right vertebral artery and the left vertebral artery origin secondary to atherosclerosis, approximately 40-50% stenosis of the proximal left internal carotid artery in the neck. 5. TTE report without findings to suggest a cardioembolic source 6. MRI brain: Positive for a 7 mm area of restricted diffusion at the lateral aspect of the dorsal right pons, consistent with an acute lacunar infarction. Susceptibility effect along the lateral left lentiform nucleus likely reflects chronic microhemorrhage. Patchy and confluent areas of T2 hyperintensity in the supratentorial and pontine white matter nonspecific but probably reflect moderate chronic microvascular ischemic changes 7. Stroke Risk Factors - Smoking and HTN  Recommendations: 1. HgbA1c, fasting lipid panel 2. Prophylactic therapy- Has been started on ASA 81 mg po qd.  3. Risk factor modification to include smoking cessation 4. Start atorvastatin 40 mg po qd. Obtain baseline CK level  5. Restart his home lisinopril. Given the markedly elevated BPs seen in the ED, most likely will need to have his home dose escalated from the 20 mg qd has was taking prior to admission 6. PT to clear prior to discharge.  7. Should start taking BP readings TID at home and record in a BP diary.  8. Smoking cessation.  9. Daily light exercise 30-60 minutes. Walking preferred.    LOS: 0 days   @Electronically  signed: Dr. Kerney Elbe 01/02/2020  10:04 AM

## 2020-01-02 NOTE — Discharge Instructions (Signed)
Paresthesia Paresthesia is a burning or prickling feeling. This feeling can happen in any part of the body. It often happens in the hands, arms, legs, or feet. Usually, it is not painful. In most cases, the feeling goes away in a short time and is not a sign of a serious problem. If you have paresthesia that lasts a long time, you may need to be seen by your doctor. Follow these instructions at home: Alcohol use   Do not drink alcohol if: ? Your doctor tells you not to drink. ? You are pregnant, may be pregnant, or are planning to become pregnant.  If you drink alcohol: ? Limit how much you use to:  0-1 drink a day for women.  0-2 drinks a day for men. ? Be aware of how much alcohol is in your drink. In the [place] one drink equals one 12 oz bottle of beer (355 mL), one 5 oz glass of wine (148 mL), or one 1 oz glass of hard liquor (44 mL). Nutrition   Eat a healthy diet. This includes: ? Eating foods that have a lot of fiber in them, such as fresh fruits and vegetables, whole grains, and beans. ? Limiting foods that have a lot of fat and processed sugars in them, such as fried or sweet foods. General instructions  Take over-the-counter and prescription medicines only as told by your doctor.  Do not use any products that have nicotine or tobacco in them, such as cigarettes and e-cigarettes. If you need help quitting, ask your doctor.  If you have diabetes, work with your doctor to make sure your blood sugar stays in a healthy range.  If your feet feel numb: ? Check for redness, warmth, and swelling every day. ? Wear padded socks and comfortable shoes. These help protect your feet.  Keep all follow-up visits as told by your doctor. This is important. Contact a doctor if:  You have paresthesia that gets worse or does not go away.  Your burning or prickling feeling gets worse when you walk.  You have pain or cramps.  You feel dizzy.  You have a rash. Get help right away if  you:  Feel weak.  Have trouble walking or moving.  Have problems speaking, understanding, or seeing.  Feel confused.  Cannot control when you pee (urinate) or poop (have a bowel movement).  Lose feeling (have numbness) after an injury.  Have new weakness in an arm or leg.  Pass out (faint). Summary  Paresthesia is a burning or prickling feeling. It often happens in the hands, arms, legs, or feet.  In most cases, the feeling goes away in a short time and is not a sign of a serious problem.  If you have paresthesia that lasts a long time, you may need to be seen by your doctor. This information is not intended to replace advice given to you by your health care provider. Make sure you discuss any questions you have with your health care provider. Document Revised: 02/12/2018 Document Reviewed: 01/26/2017 Elsevier Patient Education  2020 [employer].

## 2020-01-02 NOTE — Plan of Care (Signed)
  Problem: Education: Goal: Knowledge of General Education information will improve Description Including pain rating scale, medication(s)/side effects and non-pharmacologic comfort measures Outcome: Progressing   

## 2020-01-06 NOTE — Discharge Summary (Signed)
[facility] [place] NAME: Jacob Velasquez    MR#:  [id number]  DATE OF BIRTH:  05/08/1945  DATE OF ADMISSION:  01/01/2020   ADMITTING PHYSICIAN: Amy N Cox, DO  DATE OF DISCHARGE: 01/02/2020 12:45 PM  PRIMARY CARE PHYSICIAN: Baxter Hire, MD   ADMISSION DIAGNOSIS:  TIA (transient ischemic attack) [G45.9] Paresthesia [R20.2] DISCHARGE DIAGNOSIS:  Active Problems:   TIA (transient ischemic attack)   Paresthesia  SECONDARY DIAGNOSIS:   Past Medical History:  Diagnosis Date   Hypertension    HOSPITAL COURSE:  Jacob Velasquez is a 74 y.o. male with medical history significant for hypertension and lisinopril, tickborne fever admitted for chief concerns of numbness and tingling that started on 12/31/2019. He reported the numbness and tingling are along the left side of face and upper and lower extremities that started about 10 AM on 12/31/19. He was admitted for stroke work up as symptoms persisted.  Acute CVA: MRI brain: Positive for a 7 mm area of restricted diffusion at the lateral aspect of the dorsal right pons, consistent with an acute lacunar infarction. - Exam revealed no objective focal deficit. Paresthesia symptoms resolved. - CT head: No evidence of acute intracranial abnormality. Mild-to-moderate chronic small vessel ischemic changes in the cerebral white matter. - CTA of head and neck: No evidence of large vessel occlusion. Multifocal atherosclerotic narrowing, including severe stenosis of the proximal right P1 PCA and the left P2 PCA, moderate stenosis of a proximal left M2 MCA branch, severe narrowing of the proximal right vertebral artery and the left vertebral artery origin secondary to atherosclerosis, approximately 40-50% stenosis of the proximal left internal carotid artery in the neck. - TTE report without findings to suggest a cardioembolic source.  - Neuro seen and recommended [facility] at D/C   Hypertension- as BP was running  high, norvasc was added to lisinopril for better BP control.  Patient was very anxious to leave the hospital.   DISCHARGE CONDITIONS:  stable CONSULTS OBTAINED:    DRUG ALLERGIES:  No Known Allergies DISCHARGE MEDICATIONS:   Allergies as of 01/02/2020   No Known Allergies      Medication List     TAKE these medications    amLODipine 5 MG tablet Commonly known as: NORVASC Take 1 tablet (5 mg total) by mouth daily.   aspirin 81 MG EC tablet Take 1 tablet (81 mg total) by mouth daily. Swallow whole.   atorvastatin 40 MG tablet Commonly known as: Lipitor Take 1 tablet (40 mg total) by mouth daily.   lisinopril 20 MG tablet Commonly known as: ZESTRIL Take 20 mg by mouth daily.   meloxicam 7.5 MG tablet Commonly known as: MOBIC Take 7.5 mg by mouth daily.       DISCHARGE INSTRUCTIONS:   DIET:  Cardiac diet and Low fat, Low cholesterol diet DISCHARGE CONDITION:  Stable ACTIVITY:  Activity as tolerated OXYGEN:  Home Oxygen: No.  Oxygen Delivery: room air DISCHARGE LOCATION:  home   If you experience worsening of your admission symptoms, develop shortness of breath, life threatening emergency, suicidal or homicidal thoughts you must seek medical attention immediately by calling 911 or calling your MD immediately  if symptoms less severe.  You Must read complete instructions/literature along with all the possible adverse reactions/side effects for all the Medicines you take and that have been prescribed to you. Take any new Medicines after you have completely understood and accpet all the possible adverse reactions/side effects.  Please note  You were cared for by a hospitalist during your hospital stay. If you have any questions about your discharge medications or the care you received while you were in the hospital after you are discharged, you can call the unit and asked to speak with the hospitalist on call if the hospitalist that took care of you is not  available. Once you are discharged, your primary care physician will handle any further medical issues. Please note that NO REFILLS for any discharge medications will be authorized once you are discharged, as it is imperative that you return to your primary care physician (or establish a relationship with a primary care physician if you do not have one) for your aftercare needs so that they can reassess your need for medications and monitor your lab values.    On the day of Discharge:  VITAL SIGNS:  Blood pressure (!) 160/76, pulse (!) 55, temperature 98.5 F (36.9 C), resp. rate 20, height 6\' 2"  (1.88 m), weight 97.5 kg, SpO2 95 %. PHYSICAL EXAMINATION:  GENERAL:  74 y.o.-year-old patient lying in the bed with no acute distress.  EYES: Pupils equal, round, reactive to light and accommodation. No scleral icterus. Extraocular muscles intact.  HEENT: Head atraumatic, normocephalic. Oropharynx and nasopharynx clear.  NECK:  Supple, no jugular venous distention. No thyroid enlargement, no tenderness.  LUNGS: Normal breath sounds bilaterally, no wheezing, rales,rhonchi or crepitation. No use of accessory muscles of respiration.  CARDIOVASCULAR: S1, S2 normal. No murmurs, rubs, or gallops.  ABDOMEN: Soft, non-tender, non-distended. Bowel sounds present. No organomegaly or mass.  EXTREMITIES: No pedal edema, cyanosis, or clubbing.  NEUROLOGIC: Cranial nerves II through XII are intact. Muscle strength 5/5 in all extremities. Sensation intact. Gait not checked.  PSYCHIATRIC: The patient is alert and oriented x 3.  SKIN: No obvious rash, lesion, or ulcer.  DATA REVIEW:   CBC Recent Labs  Lab 01/01/20 1253  WBC 7.7  HGB 15.3  HCT 46.2  PLT 256    Chemistries  Recent Labs  Lab 01/01/20 1253  NA 137  K 4.0  CL 104  CO2 24  GLUCOSE 148*  BUN 18  CREATININE 1.14  CALCIUM 9.1  AST 22  ALT 15  ALKPHOS 52  BILITOT 1.0     Outpatient follow-up  Follow-up Information     Baxter Hire, MD. Schedule an appointment as soon as possible for a visit in 1 week.   Specialty: Internal Medicine Why: patient to make own appointment;office closed from 12-2 Contact information: [place] [address] [phone]         Vladimir Crofts, MD. Schedule an appointment as soon as possible for a visit in 2 weeks.   Specialty: Neurology Why: patient to make own appointment; office closed from 12-2 Contact information: [facility] [place] [address] [phone]                  Management plans discussed with the patient, family and they are in agreement.  CODE STATUS: Prior   TOTAL TIME TAKING CARE OF THIS PATIENT: 45 minutes.    Max Sane M.D on 01/06/2020 at 1:48 PM  [facility]   CC: Primary care physician; Baxter Hire, MD   Note: This dictation was prepared with Dragon dictation along with smaller phrase technology. Any transcriptional errors that result from this process are unintentional.

## 2020-04-01 ENCOUNTER — Encounter: Payer: Medicare Other | Admitting: Dermatology

## 2020-07-29 ENCOUNTER — Other Ambulatory Visit: Payer: Self-pay

## 2020-07-29 ENCOUNTER — Emergency Department: Payer: Medicare Other

## 2020-07-29 ENCOUNTER — Inpatient Hospital Stay
Admission: EM | Admit: 2020-07-29 | Discharge: 2020-08-01 | DRG: 455 | Disposition: A | Discharge status: Home / Self Care | Payer: Medicare Other | Attending: Internal Medicine | Admitting: Internal Medicine

## 2020-07-29 DIAGNOSIS — S22070A Wedge compression fracture of T9-T10 vertebra, initial encounter for closed fracture: Secondary | ICD-10-CM | POA: Diagnosis not present

## 2020-07-29 DIAGNOSIS — F17210 Nicotine dependence, cigarettes, uncomplicated: Secondary | ICD-10-CM | POA: Diagnosis present

## 2020-07-29 DIAGNOSIS — Z87891 Personal history of nicotine dependence: Secondary | ICD-10-CM | POA: Diagnosis present

## 2020-07-29 DIAGNOSIS — S22078A Other fracture of T9-T10 vertebra, initial encounter for closed fracture: Secondary | ICD-10-CM

## 2020-07-29 DIAGNOSIS — E785 Hyperlipidemia, unspecified: Secondary | ICD-10-CM | POA: Diagnosis present

## 2020-07-29 DIAGNOSIS — Z7982 Long term (current) use of aspirin: Secondary | ICD-10-CM | POA: Diagnosis not present

## 2020-07-29 DIAGNOSIS — Z79899 Other long term (current) drug therapy: Secondary | ICD-10-CM | POA: Diagnosis not present

## 2020-07-29 DIAGNOSIS — Z720 Tobacco use: Secondary | ICD-10-CM

## 2020-07-29 DIAGNOSIS — I10 Essential (primary) hypertension: Secondary | ICD-10-CM | POA: Diagnosis present

## 2020-07-29 DIAGNOSIS — M532X4 Spinal instabilities, thoracic region: Secondary | ICD-10-CM | POA: Diagnosis present

## 2020-07-29 DIAGNOSIS — W11XXXA Fall on and from ladder, initial encounter: Secondary | ICD-10-CM | POA: Diagnosis present

## 2020-07-29 DIAGNOSIS — S22072A Unstable burst fracture of T9-T10 vertebra, initial encounter for closed fracture: Secondary | ICD-10-CM | POA: Diagnosis not present

## 2020-07-29 DIAGNOSIS — R202 Paresthesia of skin: Secondary | ICD-10-CM | POA: Diagnosis present

## 2020-07-29 DIAGNOSIS — Z8673 Personal history of transient ischemic attack (TIA), and cerebral infarction without residual deficits: Secondary | ICD-10-CM

## 2020-07-29 DIAGNOSIS — Z20822 Contact with and (suspected) exposure to covid-19: Secondary | ICD-10-CM | POA: Diagnosis present

## 2020-07-29 DIAGNOSIS — M48061 Spinal stenosis, lumbar region without neurogenic claudication: Secondary | ICD-10-CM | POA: Diagnosis present

## 2020-07-29 DIAGNOSIS — S22079A Unspecified fracture of T9-T10 vertebra, initial encounter for closed fracture: Secondary | ICD-10-CM

## 2020-07-29 LAB — CREATININE, SERUM
Creatinine, Ser: 1.25 mg/dL — ABNORMAL HIGH (ref 0.61–1.24)
GFR, Estimated: >60 mL/min (ref >60)

## 2020-07-29 LAB — CBC
HCT: 45.8 % (ref 39.0–52.0)
Hemoglobin: 15.2 g/dL (ref 13.0–17.0)
MCH: 31.1 pg (ref 26.0–34.0)
MCHC: 33.2 g/dL (ref 30.0–36.0)
MCV: 93.7 fL (ref 80.0–100.0)
Platelets: 224 10*3/uL (ref 150–400)
RBC: 4.89 MIL/uL (ref 4.22–5.81)
RDW: 13.2 % (ref 11.5–15.5)
WBC: 9 10*3/uL (ref 4.0–10.5)
nRBC: 0 % (ref 0.0–0.2)

## 2020-07-29 LAB — RESP PANEL BY RT-PCR (FLU A&B, COVID) ARPGX2
Influenza A by PCR: NEGATIVE
Influenza B by PCR: NEGATIVE
SARS Coronavirus 2 by RT PCR: NEGATIVE

## 2020-07-29 MED ORDER — FENTANYL CITRATE (PF) 100 MCG/2ML IJ SOLN
50.0000 ug | Freq: Once | INTRAMUSCULAR | Status: AC
  Administered 2020-07-29: 50 ug via INTRAMUSCULAR
  Filled 2020-07-29: qty 2

## 2020-07-29 MED ORDER — ATORVASTATIN CALCIUM 20 MG PO TABS: 40.0000 mg | ORAL_TABLET | Freq: Every day | ORAL | Status: DC

## 2020-07-29 MED ORDER — ONDANSETRON HCL 4 MG PO TABS
4.0000 mg | ORAL_TABLET | Freq: Four times a day (QID) | ORAL | Status: DC | PRN
  Administered 2020-07-31: 4 mg via ORAL
  Filled 2020-07-29: qty 1

## 2020-07-29 MED ORDER — LACTATED RINGERS IV SOLN: INTRAVENOUS | Status: DC

## 2020-07-29 MED ORDER — NICOTINE 21 MG/24HR TD PT24
21.0000 mg | MEDICATED_PATCH | Freq: Every day | TRANSDERMAL | Status: DC
  Filled 2020-07-29 (×2): qty 1

## 2020-07-29 MED ORDER — AMLODIPINE BESYLATE 5 MG PO TABS: 5.0000 mg | ORAL_TABLET | Freq: Every day | ORAL | Status: DC

## 2020-07-29 MED ORDER — ONDANSETRON HCL 4 MG/2ML IJ SOLN
4.0000 mg | Freq: Four times a day (QID) | INTRAMUSCULAR | Status: DC | PRN
  Administered 2020-07-29 – 2020-07-30 (×2): 4 mg via INTRAVENOUS
  Filled 2020-07-29 (×2): qty 2

## 2020-07-29 MED ORDER — FENTANYL CITRATE (PF) 100 MCG/2ML IJ SOLN
75.0000 ug | Freq: Once | INTRAMUSCULAR | Status: AC
  Administered 2020-07-29: 75 ug via INTRAMUSCULAR
  Filled 2020-07-29: qty 2

## 2020-07-29 MED ORDER — METHOCARBAMOL 1000 MG/10ML IJ SOLN
500.0000 mg | Freq: Four times a day (QID) | INTRAVENOUS | Status: DC | PRN
  Filled 2020-07-29: qty 5

## 2020-07-29 MED ORDER — LISINOPRIL 10 MG PO TABS: 20.0000 mg | ORAL_TABLET | Freq: Every day | ORAL | Status: DC

## 2020-07-29 MED ORDER — HYDROMORPHONE HCL 1 MG/ML IJ SOLN
0.5000 mg | INTRAMUSCULAR | Status: DC | PRN
  Administered 2020-07-29: 1 mg via INTRAVENOUS
  Filled 2020-07-29: qty 1

## 2020-07-29 MED ORDER — ENOXAPARIN SODIUM 40 MG/0.4ML IJ SOSY
40.0000 mg | PREFILLED_SYRINGE | INTRAMUSCULAR | Status: DC
  Administered 2020-07-30 – 2020-07-31 (×2): 40 mg via SUBCUTANEOUS
  Filled 2020-07-29 (×3): qty 0.4

## 2020-07-29 NOTE — ED Triage Notes (Signed)
Pt reports falling when stepping down off of bottom run on ladder. C/o right sided back pain. Pt states he hit back on ground. Pt does take blood thinners, and reports hitting head as well. No obvious injury noted.

## 2020-07-29 NOTE — ED Provider Notes (Signed)
[facility]-EMERGENCY DEPARTMENT  ____________________________________________  Time seen: Approximately 3:59 PM  I have reviewed the triage vital signs and the nursing notes.   HISTORY  Chief Complaint Fall and Back Pain   Historian Patient     HPI Jacob Velasquez is a 75 y.o. male presents to the emergency department with low back pain and upper back pain after patient had a mechanical fall from a ladder.  Patient states that he was on the last step of the ladder when fall occurred.  He states that he did hit his head and takes aspirin daily.  No numbness or tingling in the upper and lower extremities.  He reports a history of recent rib fracture but denies other recent injuries.  No numbness or tingling in the upper and lower extremities.  Patient reports that he cannot easily transition from a sitting to a supine position without significant pain.  No bowel or bladder incontinence or saddle anesthesia.  No abrasions or lacerations.   Past Medical History:  Diagnosis Date   Hypertension      Immunizations up to date:  Yes.     Past Medical History:  Diagnosis Date   Hypertension     Patient Active Problem List   Diagnosis Date Noted   Closed wedge compression fracture of T9 vertebra ([place]) 07/29/2020   Tobacco use 07/29/2020   Paresthesia    TIA (transient ischemic attack) 01/01/2020   Essential hypertension 10/10/2018    No past surgical history on file.  Prior to Admission medications   Medication Sig Start Date End Date Taking? Authorizing Provider  amLODipine (NORVASC) 5 MG tablet Take 1 tablet (5 mg total) by mouth daily. 01/02/20 02/01/20  Max Sane, MD  aspirin EC 81 MG EC tablet Take 1 tablet (81 mg total) by mouth daily. Swallow whole. 01/03/20   Max Sane, MD  atorvastatin (LIPITOR) 40 MG tablet Take 1 tablet (40 mg total) by mouth daily. 01/02/20 02/01/20  Max Sane, MD  lisinopril (ZESTRIL) 20 MG tablet Take 20 mg by mouth daily. 11/21/19   [provider]  meloxicam (MOBIC) 7.5 MG tablet Take 7.5 mg by mouth daily. 12/05/19   [provider]    Allergies Patient has no known allergies.  No family history on file.  Social History Social History   Tobacco Use   Smoking status: Some Days    Pack years: 0.00    Types: Cigarettes   Smokeless tobacco: Never  Substance Use Topics   Alcohol use: Never   Drug use: Never     Review of Systems  Constitutional: No fever/chills Eyes:  No discharge ENT: No upper respiratory complaints. Respiratory: no cough. No SOB/ use of accessory muscles to breath Gastrointestinal:   No nausea, no vomiting.  No diarrhea.  No constipation. Musculoskeletal: Patient has upper back pain and low back pain.  Skin: Negative for rash, abrasions, lacerations, ecchymosis.   ____________________________________________   PHYSICAL EXAM:  VITAL SIGNS: ED Triage Vitals  Enc Vitals Group     BP 07/29/20 1434 138/84     Pulse Rate 07/29/20 1434 66     Resp 07/29/20 1434 20     Temp 07/29/20 1434 98 F (36.7 C)     Temp Source 07/29/20 1434 Oral     SpO2 07/29/20 1434 97 %     Weight 07/29/20 1448 220 lb (99.8 kg)     Height --      Head Circumference --      Peak  Flow --      Pain Score 07/29/20 1448 10     Pain Loc --      Pain Edu? --      Excl. in [place]? --      Constitutional: Alert and oriented. Well appearing and in no acute distress. Eyes: Conjunctivae are normal. PERRL. EOMI. Head: Atraumatic. ENT:      Nose: No congestion/rhinnorhea.      Mouth/Throat: Mucous membranes are moist.  Neck: No stridor.  No cervical spine tenderness to palpation. Cardiovascular: Normal rate, regular rhythm. Normal S1 and S2.  Good peripheral circulation. Respiratory: Normal respiratory effort without tachypnea or retractions. Lungs CTAB. Good air entry to the bases with no decreased or absent breath sounds Gastrointestinal: Bowel sounds x 4 quadrants. Soft and nontender to palpation. No  guarding or rigidity. No distention. Musculoskeletal: Full range of motion to all extremities. No obvious deformities noted.  Patient has midline thoracic and lumbar spine tenderness.  Neurologic:  Normal for age. No gross focal neurologic deficits are appreciated.  Skin:  Skin is warm, dry and intact. No rash noted. Psychiatric: Mood and affect are normal for age. Speech and behavior are normal.   ____________________________________________   LABS (all labs ordered are listed, but only abnormal results are displayed)  Labs Reviewed  RESP PANEL BY RT-PCR (FLU A&B, COVID) [id number]   ____________________________________________  EKG   ____________________________________________  RADIOLOGY Unk Pinto, personally viewed and evaluated these images (plain radiographs) as part of my medical decision making, as well as reviewing the written report by the radiologist.    CT Head Wo Contrast  Result Date: 07/29/2020 CLINICAL DATA:  75 year old male with neck trauma. EXAM: CT HEAD WITHOUT CONTRAST CT CERVICAL SPINE WITHOUT CONTRAST TECHNIQUE: Multidetector CT imaging of the head and cervical spine was performed following the standard protocol without intravenous contrast. Multiplanar CT image reconstructions of the cervical spine were also generated. COMPARISON:  Head CT dated 01/01/2020. FINDINGS: CT HEAD FINDINGS Brain: The ventricles and sulci appropriate size for patient's age. Mild periventricular and deep white matter chronic microvascular ischemic changes noted. There is no acute intracranial hemorrhage. No mass effect or midline shift no extra-axial fluid collection. Vascular: No hyperdense vessel or unexpected calcification. Skull: Normal. Negative for fracture or focal lesion. Sinuses/Orbits: No acute finding. Other: None CT CERVICAL SPINE FINDINGS Alignment: No acute subluxation. Skull base and vertebrae: No acute fracture. 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 Soft  tissues and spinal canal: No prevertebral fluid or swelling. No visible canal hematoma. Disc levels: Multilevel degenerative changes with anterior osteophyte. Upper chest: Biapical subpleural scarring. Other: Mild right carotid bulb calcified plaques. IMPRESSION: 1. No acute intracranial pathology. Mild chronic microvascular ischemic changes. 2. No acute/traumatic cervical spine pathology. Electronically Signed   By: Anner Crete M.D.   On: 07/29/2020 17:23   CT Cervical Spine Wo Contrast  Result Date: 07/29/2020 CLINICAL DATA:  75 year old male with neck trauma. EXAM: CT HEAD WITHOUT CONTRAST CT CERVICAL SPINE WITHOUT CONTRAST TECHNIQUE: Multidetector CT imaging of the head and cervical spine was performed following the standard protocol without intravenous contrast. Multiplanar CT image reconstructions of the cervical spine were also generated. COMPARISON:  Head CT dated 01/01/2020. FINDINGS: CT HEAD FINDINGS Brain: The ventricles and sulci appropriate size for patient's age. Mild periventricular and deep white matter chronic microvascular ischemic changes noted. There is no acute intracranial hemorrhage. No mass effect or midline shift no extra-axial fluid collection. Vascular: No hyperdense vessel or unexpected calcification. Skull: Normal.  Negative for fracture or focal lesion. Sinuses/Orbits: No acute finding. Other: None CT CERVICAL SPINE FINDINGS Alignment: No acute subluxation. Skull base and vertebrae: No acute fracture. 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 Soft tissues and spinal canal: No prevertebral fluid or swelling. No visible canal hematoma. Disc levels: Multilevel degenerative changes with anterior osteophyte. Upper chest: Biapical subpleural scarring. Other: Mild right carotid bulb calcified plaques. IMPRESSION: 1. No acute intracranial pathology. Mild chronic microvascular ischemic changes. 2. No acute/traumatic cervical spine pathology. Electronically Signed   By: Anner Crete M.D.   On: 07/29/2020 17:23   CT Thoracic Spine Wo Contrast  Result Date: 07/29/2020 CLINICAL DATA:  Falling after stepping off bottom rung of ladder. Right back pain, struck back on ground. Yet key at the EXAM: CT THORACIC AND LUMBAR SPINE WITHOUT CONTRAST TECHNIQUE: Multidetector CT imaging of the thoracic and lumbar spine was performed without contrast. Multiplanar CT image reconstructions were also generated. COMPARISON:  Two-view chest radiograph 07/20/2019 FINDINGS: CT THORACIC SPINE FINDINGS Alignment: Preservation of the normal thoracic kyphosis. No perched or jumped facets. Minimal widening across a fracture line extending through the T9 vertebral body extending into the disc space and through the posterior elements and into the supraspinous ligament. Vertebrae: Multilevel flowing anterior osteophytosis, compatible with features of diffuse idiopathic skeletal hyperostosis (DISH). Partial fusion across multiple spinous processes as well. There is a chalk stick type fracture and extending through the anterior cortex of the T9 vertebrae into the inferior endplate and extending through the T9-T10 disc space with presumed disruption of the posterior tension band given a vertical fracture through the T9 spinous process as well. No clear extension into the T10 vertebral level. No other acute fracture or traumatic osseous injury. The osseous structures appear diffusely demineralized which may limit detection of small or nondisplaced fractures. Paraspinal and other soft tissues: Minimal soft tissue thickening adjacent the T9 fracture. No other visible paraspinal fluid, swelling, gas or hemorrhage. No visible canal hematoma. Included portions the chest and abdomen reveal aortic atherosclerosis and some dependent atelectatic changes without other acute abnormality. Disc levels: Mild multilevel discogenic changes including evidence of desiccation most pronounced T5 to T9 as well as at T10 and 11.  Multilevel Schmorl's node formations are noted as well. Minimal posterior spurring of the endplates T11-12. No significant central canal or foraminal stenosis identified within the imaged levels of the thoracic spine. CT LUMBAR SPINE FINDINGS Segmentation: 5 normally formed lumbar levels. Alignment: 3 mm retrolisthesis L2 on L3 favored to be on a facet degenerative basis. No abnormally widened, jumped or perched facets. Vertebrae: No acute vertebral body fracture or height loss. Discogenic and facet degenerative changes as below. The osseous structures appear diffusely demineralized which may limit detection of small or nondisplaced fractures. No suspicious osseous lesions. Paraspinal and other soft tissues: No paraspinal fluid, swelling, gas or hemorrhage. No visible canal hematoma. Included portion of the abdomen and pelvis reveal aortoiliac atherosclerosis. Mild fusiform dilatation of the infrarenal abdominal aorta to 3.2 cm. No acute abnormality posterior abdomen or pelvis. Mild posterior body wall edema. Disc levels: Level by level evaluation of the lumbar spine below: T12-L1: Anterior bridging osteophyte. No significant posterior disc abnormality. No significant spinal canal or foraminal stenosis. L1-L2: Anterior bridging osteophyte. Disc desiccation. No significant posterior disc abnormality. Mild bilateral facet arthropathy. No significant spinal canal or foraminal stenosis. L2-L3: Retrolisthesis, disc height loss and vacuum disc formation with exuberant though non bridging anterior osteophyte formation. Mild bilateral facet arthropathy. Resulting moderate canal stenosis and  moderate bilateral foraminal narrowing with effacement of the lateral recesses. L3-L4: Disc height loss, desiccation and vacuum disc phenomenon with mild bilateral facet arthropathy. Moderate canal stenosis and bilateral foraminal narrowing. L4-L5: Disc height loss, desiccation with anterior spurring. Moderate bilateral facet  arthropathy. Mild to moderate canal stenosis with moderate right and severe left foraminal narrowing and effacement of the lateral recesses. L5-S1: Disc height loss, desiccation and anterior spurring. Moderate bilateral facet arthropathy and ligamentum flavum infolding. Mild canal stenosis and severe right moderate left foraminal narrowing with effacement of the lateral recesses. IMPRESSION: CT THORACIC SPINE IMPRESSION Features of diffuse idiopathic skeletal hyperostosis with a chalk stick fracture extending T9 vertebral body and into the T9-10 disc space with likely disruption of the posterior tension band with associated vertically oriented fracture through the T9 spinous process (AO Spine B2). Minimal adjacent soft tissue thickening. Should be considered an unstable fracture into Additional multilevel degenerative changes without significant canal stenosis or foraminal narrowing. CT LUMBAR SPINE IMPRESSION No acute fracture or vertebral body height loss. Multilevel discogenic and facet degenerative changes, detailed above. Retrolisthesis L2 on L3 is favored to be on a degenerative basis with some resulting moderate canal stenosis. Aortic Atherosclerosis (ICD10-I70.0). 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 Recommend follow-up ultrasound every 3 years. This recommendation follows ACR consensus guidelines: White Paper of the ACR Incidental Findings Committee II on Vascular Findings. J Am Coll Radiol 2013; 10:789-794. These results were called by telephone at the time of interpretation on 07/29/2020 at 5:41 pm to provider [facility] , who verbally acknowledged these results. Electronically Signed   By: Lovena Le M.D.   On: 07/29/2020 17:43   CT Lumbar Spine Wo Contrast  Result Date: 07/29/2020 CLINICAL DATA:  Falling after stepping off bottom rung of ladder. Right back pain, struck back on ground. Yet key at the EXAM: CT THORACIC AND LUMBAR SPINE WITHOUT CONTRAST TECHNIQUE:  Multidetector CT imaging of the thoracic and lumbar spine was performed without contrast. Multiplanar CT image reconstructions were also generated. COMPARISON:  Two-view chest radiograph 07/20/2019 FINDINGS: CT THORACIC SPINE FINDINGS Alignment: Preservation of the normal thoracic kyphosis. No perched or jumped facets. Minimal widening across a fracture line extending through the T9 vertebral body extending into the disc space and through the posterior elements and into the supraspinous ligament. Vertebrae: Multilevel flowing anterior osteophytosis, compatible with features of diffuse idiopathic skeletal hyperostosis (DISH). Partial fusion across multiple spinous processes as well. There is a chalk stick type fracture and extending through the anterior cortex of the T9 vertebrae into the inferior endplate and extending through the T9-T10 disc space with presumed disruption of the posterior tension band given a vertical fracture through the T9 spinous process as well. No clear extension into the T10 vertebral level. No other acute fracture or traumatic osseous injury. The osseous structures appear diffusely demineralized which may limit detection of small or nondisplaced fractures. Paraspinal and other soft tissues: Minimal soft tissue thickening adjacent the T9 fracture. No other visible paraspinal fluid, swelling, gas or hemorrhage. No visible canal hematoma. Included portions the chest and abdomen reveal aortic atherosclerosis and some dependent atelectatic changes without other acute abnormality. Disc levels: Mild multilevel discogenic changes including evidence of desiccation most pronounced T5 to T9 as well as at T10 and 11. Multilevel Schmorl's node formations are noted as well. Minimal posterior spurring of the endplates T11-12. No significant central canal or foraminal stenosis identified within the imaged levels of the thoracic spine. CT LUMBAR SPINE FINDINGS  Segmentation: 5 normally formed lumbar levels.  Alignment: 3 mm retrolisthesis L2 on L3 favored to be on a facet degenerative basis. No abnormally widened, jumped or perched facets. Vertebrae: No acute vertebral body fracture or height loss. Discogenic and facet degenerative changes as below. The osseous structures appear diffusely demineralized which may limit detection of small or nondisplaced fractures. No suspicious osseous lesions. Paraspinal and other soft tissues: No paraspinal fluid, swelling, gas or hemorrhage. No visible canal hematoma. Included portion of the abdomen and pelvis reveal aortoiliac atherosclerosis. Mild fusiform dilatation of the infrarenal abdominal aorta to 3.2 cm. No acute abnormality posterior abdomen or pelvis. Mild posterior body wall edema. Disc levels: Level by level evaluation of the lumbar spine below: T12-L1: Anterior bridging osteophyte. No significant posterior disc abnormality. No significant spinal canal or foraminal stenosis. L1-L2: Anterior bridging osteophyte. Disc desiccation. No significant posterior disc abnormality. Mild bilateral facet arthropathy. No significant spinal canal or foraminal stenosis. L2-L3: Retrolisthesis, disc height loss and vacuum disc formation with exuberant though non bridging anterior osteophyte formation. Mild bilateral facet arthropathy. Resulting moderate canal stenosis and moderate bilateral foraminal narrowing with effacement of the lateral recesses. L3-L4: Disc height loss, desiccation and vacuum disc phenomenon with mild bilateral facet arthropathy. Moderate canal stenosis and bilateral foraminal narrowing. L4-L5: Disc height loss, desiccation with anterior spurring. Moderate bilateral facet arthropathy. Mild to moderate canal stenosis with moderate right and severe left foraminal narrowing and effacement of the lateral recesses. L5-S1: Disc height loss, desiccation and anterior spurring. Moderate bilateral facet arthropathy and ligamentum flavum infolding. Mild canal stenosis and severe  right moderate left foraminal narrowing with effacement of the lateral recesses. IMPRESSION: CT THORACIC SPINE IMPRESSION Features of diffuse idiopathic skeletal hyperostosis with a chalk stick fracture extending T9 vertebral body and into the T9-10 disc space with likely disruption of the posterior tension band with associated vertically oriented fracture through the T9 spinous process (AO Spine B2). Minimal adjacent soft tissue thickening. Should be considered an unstable fracture into Additional multilevel degenerative changes without significant canal stenosis or foraminal narrowing. CT LUMBAR SPINE IMPRESSION No acute fracture or vertebral body height loss. Multilevel discogenic and facet degenerative changes, detailed above. Retrolisthesis L2 on L3 is favored to be on a degenerative basis with some resulting moderate canal stenosis. Aortic Atherosclerosis (ICD10-I70.0). 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 Recommend follow-up ultrasound every 3 years. This recommendation follows ACR consensus guidelines: White Paper of the ACR Incidental Findings Committee II on Vascular Findings. J Am Coll Radiol 2013; 10:789-794. These results were called by telephone at the time of interpretation on 07/29/2020 at 5:41 pm to provider [facility] , who verbally acknowledged these results. Electronically Signed   By: Lovena Le M.D.   On: 07/29/2020 17:43   MR THORACIC SPINE WO CONTRAST  Result Date: 07/29/2020 CLINICAL DATA:  T9 fracture.  Fall.  Right back pain. EXAM: MRI THORACIC SPINE WITHOUT CONTRAST TECHNIQUE: Multiplanar, multisequence MR imaging of the thoracic spine was performed. No intravenous contrast was administered. COMPARISON:  CT thoracic spine 07/29/2020 FINDINGS: Alignment:  No vertebral subluxation is observed. Vertebrae: Oblique fracture extends from the anterior superior T9 vertebral body to the central inferior endplate as shown for example on image 10 series 19. There  is also fracture of of the spinous process of T9 near the tip of the spinous process which is partially fused along the supraspinous ligament. There is also a subtle linear T2 signal hyperintensity traversing the T8 spinous process compatible with  fracture or stress fracture on image 9 series 19, this appears nondisplaced. We do not show a significant amount of interspinous edema or splaying of the spinous processes. No other thoracic spine fractures are observed. Flowing osteophytes compatible with diffuse idiopathic skeletal hyperostosis. Cord:  No significant abnormal spinal cord signal is observed. Paraspinal and other soft tissues: Unremarkable Disc levels: Note is made of prominence of the dorsal epidural adipose tissues in the thoracic spine. T1-2: Unremarkable. T2-3: Unremarkable. T3-4: Unremarkable. T4-5: Unremarkable. T5-6: No impingement.  Loss of intervertebral disc height. T6-7: No impingement.  Loss of disc height. T7-8: Mild left eccentric central narrowing of the thecal sac due to left paracentral disc protrusion. T8-9: Moderate central narrowing of the thecal sac due to central disc protrusion. T9-10: Unremarkable. T10-11: Unremarkable. T11-12: Mild central narrowing of the thecal sac due to disc bulge. IMPRESSION: 1. Oblique fracture of the T9 vertebral body extending from the anterior superior margin of the vertebral body to the inferior central endplate, nondisplaced. There is also a fracture of the tip of the T9 spinous process separating the spinous process from the flowing osteophytes along the supraspinous ligament. Both of these fractures were visible at CT. No subluxation is identified. 2. There is also a subtle nondisplaced fracture of the spinous process of T8. 3. Diffuse idiopathic skeletal hyperostosis. 4. Thoracic spondylosis and degenerative disc disease along with prominence of the dorsal epidural adipose tissues contribute to moderate central narrowing of the thecal sac at T8-9 and  mild central narrowing of the thecal sac at T7-8 and T11-12. Electronically Signed   By: Van Clines M.D.   On: 07/29/2020 21:07    ____________________________________________    PROCEDURES  Procedure(s) performed:     Procedures     Medications  fentaNYL (SUBLIMAZE) injection 50 mcg (50 mcg Intramuscular Given 07/29/20 1609)  fentaNYL (SUBLIMAZE) injection 75 mcg (75 mcg Intramuscular Given 07/29/20 2018)     ____________________________________________   INITIAL IMPRESSION / ASSESSMENT AND PLAN / ED COURSE  Pertinent labs & imaging results that were available during my care of the patient were reviewed by me and considered in my medical decision making (see chart for details).      Assessment and Plan: Low back pain  Upper back pain Fall:   75 year old male presents to the emergency department with upper back pain after mechanical fall from a ladder.  Vital signs are reassuring at triage.  Patient had no neuro neurodeficits on exam and was observed ambulating easily through emergency department.  CT of the thoracic spine indicates a chalk stick fracture of T9 which includes fracture of the T9 spinous process.  Consulted neurosurgeon on-call, Dr. Cari Caraway regarding patient's case who recommended MRI. Dr. Cari Caraway recommended admission to hospitalist service with surgery possibly on Friday.  Patient was accepted for admission by Dr. Jonelle Sidle.   ____________________________________________  FINAL CLINICAL IMPRESSION(S) / ED DIAGNOSES  Final diagnoses:  Closed fracture of ninth thoracic vertebra, unspecified fracture morphology, initial encounter ([place])      NEW MEDICATIONS STARTED DURING THIS VISIT:  ED Discharge Orders     None           This chart was dictated using voice recognition software/Dragon. Despite best efforts to proofread, errors can occur which can change the meaning. Any change was purely unintentional.     Lannie Fields, PA-C 07/29/20 2241    Vanessa Colquitt, MD 07/30/20 [phone]

## 2020-07-29 NOTE — ED Notes (Signed)
MRI screening completed.

## 2020-07-29 NOTE — H&P (Signed)
History and Physical   Jacob Velasquez IRC:[id number] DOB: 1945/08/25 DOA: 07/29/2020  Referring MD/NP/PA: Skeet Simmer, PA  PCP: Baxter Hire, MD   Outpatient Specialists: None  Patient coming from: Home  Chief Complaint: Back pain  HPI: Jacob Velasquez is a 75 y.o. male with medical history significant of essential hypertension, hyperlipidemia, tobacco abuse, previous TIA who presented to the ER with traumatic fall from a ladder.  Following that he is has significant low back pain and mid back pain.  He was on the last step of the ladder when he fell.  He hit his head and takes aspirin daily.  Since then he has had pain at 10 out of 10 in the lower but in the mid back.  Associated with occasional weakness.  He denied any active numbness or tingling in the upper or lower extremity.  He did have a fracture recently.  Denied any other joint pain.  Denied any fever or chills.  No loss of bladder or urine control.  He is moving his difficulty is getting up from sitting to supine position where the pain is worse.  He was seen in the ER and evaluated.  Patient appears to have unstable T9 compression fracture.  Neurosurgery consulted and recommended medical admission with possible plan for surgery on Friday..  ED Course: Vitals and telemetry stable.  Labs at this point CBC also appears to be all within normal.  COVID-19 and influenza all negative.  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  T9 vertebral body and T9-T10 disc space vertically oriented fracture through the T9 spinous process.  Considered unstable fracture.  CT lumbar spine showed no acute fracture or vertebral body lesion.  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  Dr. Cari Caraway of neurosurgery consulted and patient will be admitted to the hospital for pain  management and possible intervention.  Review of Systems: As per HPI otherwise 10 point review of systems negative.    Past Medical History:  Diagnosis Date   Hypertension     No past surgical history on file.   reports that he has been smoking cigarettes. He has never used smokeless tobacco. He reports that he does not drink alcohol and does not use drugs.  No Known Allergies  No family history on file.   Prior to Admission medications   Medication Sig Start Date End Date Taking? Authorizing Provider  amLODipine (NORVASC) 5 MG tablet Take 1 tablet (5 mg total) by mouth daily. 01/02/20 02/01/20  Max Sane, MD  aspirin EC 81 MG EC tablet Take 1 tablet (81 mg total) by mouth daily. Swallow whole. 01/03/20   Max Sane, MD  atorvastatin (LIPITOR) 40 MG tablet Take 1 tablet (40 mg total) by mouth daily. 01/02/20 02/01/20  Max Sane, MD  lisinopril (ZESTRIL) 20 MG tablet Take 20 mg by mouth daily. 11/21/19   [provider]  meloxicam (MOBIC) 7.5 MG tablet Take 7.5 mg by mouth daily. 12/05/19   [provider]    Physical Exam: Vitals:   07/29/20 1434 07/29/20 1448  BP: 138/84   Pulse: 66   Resp: 20   Temp: 98 F (36.7 C)   TempSrc: Oral   SpO2: 97%   Weight:  99.8 kg      Constitutional: Acutely ill looking no distress Vitals:   07/29/20 1434 07/29/20  1448  BP: 138/84   Pulse: 66   Resp: 20   Temp: 98 F (36.7 C)   TempSrc: Oral   SpO2: 97%   Weight:  99.8 kg   Eyes: PERRL, lids and conjunctivae normal ENMT: Mucous membranes are moist. Posterior pharynx clear of any exudate or lesions.Normal dentition.  Neck: normal, supple, no masses, no thyromegaly Respiratory: clear to auscultation bilaterally, no wheezing, no crackles. Normal respiratory effort. No accessory muscle use.  Cardiovascular: Regular rate and rhythm, no murmurs / rubs / gallops. No extremity edema. 2+ pedal pulses. No carotid bruits.  Abdomen: no tenderness, no masses palpated. No  hepatosplenomegaly. Bowel sounds positive.  Musculoskeletal: no clubbing / cyanosis. No joint deformity upper and lower extremities.  Decreased range of motion from supine position and tenderness over the upper low back, no contractures. Normal muscle tone.  Skin: no rashes, lesions, ulcers. No induration Neurologic: CN 2-12 grossly intact. Sensation intact, DTR normal. Strength 5/5 in all 4.  Psychiatric: Normal judgment and insight. Alert and oriented x 3. Normal mood.     Labs on Admission: I have personally reviewed following labs and imaging studies  CBC: No results for input(s): WBC, NEUTROABS, HGB, HCT, MCV, PLT in the last 168 hours. Basic Metabolic Panel: No results for input(s): NA, K, CL, CO2, GLUCOSE, BUN, CREATININE, CALCIUM, MG, PHOS in the last 168 hours. GFR: CrCl cannot be calculated (Patient's most recent lab result is older than the maximum 21 days allowed.). Liver Function Tests: No results for input(s): AST, ALT, ALKPHOS, BILITOT, PROT, ALBUMIN in the last 168 hours. No results for input(s): LIPASE, AMYLASE in the last 168 hours. No results for input(s): AMMONIA in the last 168 hours. Coagulation Profile: No results for input(s): INR, PROTIME in the last 168 hours. Cardiac Enzymes: No results for input(s): CKTOTAL, CKMB, CKMBINDEX, TROPONINI in the last 168 hours. BNP (last 3 results) No results for input(s): PROBNP in the last 8760 hours. HbA1C: No results for input(s): HGBA1C in the last 72 hours. CBG: No results for input(s): GLUCAP in the last 168 hours. Lipid Profile: No results for input(s): CHOL, HDL, LDLCALC, TRIG, CHOLHDL, LDLDIRECT in the last 72 hours. Thyroid Function Tests: No results for input(s): TSH, T4TOTAL, FREET4, T3FREE, THYROIDAB in the last 72 hours. Anemia Panel: No results for input(s): VITAMINB12, FOLATE, FERRITIN, TIBC, IRON, RETICCTPCT in the last 72 hours. Urine analysis:    Component Value Date/Time   COLORURINE YELLOW (A)  07/20/2019 0525   APPEARANCEUR HAZY (A) 07/20/2019 0525   LABSPEC 1.028 07/20/2019 0525   PHURINE 5.0 07/20/2019 0525   GLUCOSEU NEGATIVE 07/20/2019 0525   HGBUR NEGATIVE 07/20/2019 0525   BILIRUBINUR NEGATIVE 07/20/2019 0525   KETONESUR NEGATIVE 07/20/2019 0525   PROTEINUR NEGATIVE 07/20/2019 0525   NITRITE NEGATIVE 07/20/2019 0525   LEUKOCYTESUR NEGATIVE 07/20/2019 0525   Sepsis Labs: @LABRCNTIP (procalcitonin:4,lacticidven:4) ) Recent Results (from the past 240 hour(s))  Resp Panel by RT-PCR (Flu A&B, Covid) Nasopharyngeal Swab     Status: None   Collection Time: 07/29/20  7:42 PM   Specimen: Nasopharyngeal Swab; Nasopharyngeal(NP) swabs in vial transport medium  Result Value Ref Range Status   SARS Coronavirus 2 by RT PCR NEGATIVE NEGATIVE Final    Comment: (NOTE) SARS-CoV-2 target nucleic acids are NOT DETECTED.  The SARS-CoV-2 RNA is generally detectable in upper respiratory specimens during the acute phase of infection. The lowest concentration of SARS-CoV-2 viral copies this assay can detect is 138 copies/mL. A negative result does not preclude SARS-Cov-2  infection and should not be used as the sole basis for treatment or other patient management decisions. A negative result may occur with  improper specimen collection/handling, submission of specimen other than nasopharyngeal swab, presence of viral mutation(s) within the areas targeted by this assay, and inadequate number of viral copies(<138 copies/mL). A negative result must be combined with clinical observations, patient history, and epidemiological information. The expected result is Negative.  Fact Sheet for Patients:  EntrepreneurPulse.com.au  Fact Sheet for Healthcare Providers:  IncredibleEmployment.be  This test is no t yet approved or cleared by the [place] FDA and  has been authorized for detection and/or diagnosis of SARS-CoV-2 by FDA under an Emergency Use  Authorization (EUA). This EUA will remain  in effect (meaning this test can be used) for the duration of the COVID-19 declaration under Section 564(b)(1) of the Act, 21 U.S.C.section 360bbb-3(b)(1), unless the authorization is terminated  or revoked sooner.       Influenza A by PCR NEGATIVE NEGATIVE Final   Influenza B by PCR NEGATIVE NEGATIVE Final    Comment: (NOTE) The Xpert Xpress SARS-CoV-2/FLU/RSV plus assay is intended as an aid in the diagnosis of influenza from Nasopharyngeal swab specimens and should not be used as a sole basis for treatment. Nasal washings and aspirates are unacceptable for Xpert Xpress SARS-CoV-2/FLU/RSV testing.  Fact Sheet for Patients: EntrepreneurPulse.com.au  Fact Sheet for Healthcare Providers: IncredibleEmployment.be  This test is not yet approved or cleared by the [place] FDA and has been authorized for detection and/or diagnosis of SARS-CoV-2 by FDA under an Emergency Use Authorization (EUA). This EUA will remain in effect (meaning this test can be used) for the duration of the COVID-19 declaration under Section 564(b)(1) of the Act, 21 U.S.C. section 360bbb-3(b)(1), unless the authorization is terminated or revoked.  Performed at [facility], [address] [place], [place] [address]      Radiological Exams on Admission: CT Head Wo Contrast  Result Date: 07/29/2020 CLINICAL DATA:  75 year old male with neck trauma. EXAM: CT HEAD WITHOUT CONTRAST CT CERVICAL SPINE WITHOUT CONTRAST TECHNIQUE: Multidetector CT imaging of the head and cervical spine was performed following the standard protocol without intravenous contrast. Multiplanar CT image reconstructions of the cervical spine were also generated. COMPARISON:  Head CT dated 01/01/2020. FINDINGS: CT HEAD FINDINGS Brain: The ventricles and sulci appropriate size for patient's age. Mild periventricular and deep white matter chronic  microvascular ischemic changes noted. There is no acute intracranial hemorrhage. No mass effect or midline shift no extra-axial fluid collection. Vascular: No hyperdense vessel or unexpected calcification. Skull: Normal. Negative for fracture or focal lesion. Sinuses/Orbits: No acute finding. Other: None CT CERVICAL SPINE FINDINGS Alignment: No acute subluxation. Skull base and vertebrae: No acute fracture. 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 Soft tissues and spinal canal: No prevertebral fluid or swelling. No visible canal hematoma. Disc levels: Multilevel degenerative changes with anterior osteophyte. Upper chest: Biapical subpleural scarring. Other: Mild right carotid bulb calcified plaques. IMPRESSION: 1. No acute intracranial pathology. Mild chronic microvascular ischemic changes. 2. No acute/traumatic cervical spine pathology. Electronically Signed   By: Anner Crete M.D.   On: 07/29/2020 17:23   CT Cervical Spine Wo Contrast  Result Date: 07/29/2020 CLINICAL DATA:  75 year old male with neck trauma. EXAM: CT HEAD WITHOUT CONTRAST CT CERVICAL SPINE WITHOUT CONTRAST TECHNIQUE: Multidetector CT imaging of the head and cervical spine was performed following the standard protocol without intravenous contrast. Multiplanar CT image reconstructions of the cervical spine were also  generated. COMPARISON:  Head CT dated 01/01/2020. FINDINGS: CT HEAD FINDINGS Brain: The ventricles and sulci appropriate size for patient's age. Mild periventricular and deep white matter chronic microvascular ischemic changes noted. There is no acute intracranial hemorrhage. No mass effect or midline shift no extra-axial fluid collection. Vascular: No hyperdense vessel or unexpected calcification. Skull: Normal. Negative for fracture or focal lesion. Sinuses/Orbits: No acute finding. Other: None CT CERVICAL SPINE FINDINGS Alignment: No acute subluxation. Skull base and vertebrae: No acute fracture. There is  congenital atlanto occipital assimilation. Soft tissues and spinal canal: No prevertebral fluid or swelling. No visible canal hematoma. Disc levels: Multilevel degenerative changes with anterior osteophyte. Upper chest: Biapical subpleural scarring. Other: Mild right carotid bulb calcified plaques. IMPRESSION: 1. No acute intracranial pathology. Mild chronic microvascular ischemic changes. 2. No acute/traumatic cervical spine pathology. Electronically Signed   By: Anner Crete M.D.   On: 07/29/2020 17:23   CT Thoracic Spine Wo Contrast  Result Date: 07/29/2020 CLINICAL DATA:  Falling after stepping off bottom rung of ladder. Right back pain, struck back on ground. Yet key at the EXAM: CT THORACIC AND LUMBAR SPINE WITHOUT CONTRAST TECHNIQUE: Multidetector CT imaging of the thoracic and lumbar spine was performed without contrast. Multiplanar CT image reconstructions were also generated. COMPARISON:  Two-view chest radiograph 07/20/2019 FINDINGS: CT THORACIC SPINE FINDINGS Alignment: Preservation of the normal thoracic kyphosis. No perched or jumped facets. Minimal widening across a fracture line extending through the T9 vertebral body extending into the disc space and through the posterior elements and into the supraspinous ligament. Vertebrae: Multilevel flowing anterior osteophytosis, compatible with features of diffuse idiopathic skeletal hyperostosis (DISH). Partial fusion across multiple spinous processes as well. There is a chalk stick type fracture and extending through the anterior cortex of the T9 vertebrae into the inferior endplate and extending through the T9-T10 disc space with presumed disruption of the posterior tension band given a vertical fracture through the T9 spinous process as well. No clear extension into the T10 vertebral level. No other acute fracture or traumatic osseous injury. The osseous structures appear diffusely demineralized which may limit detection of small or nondisplaced  fractures. Paraspinal and other soft tissues: Minimal soft tissue thickening adjacent the T9 fracture. No other visible paraspinal fluid, swelling, gas or hemorrhage. No visible canal hematoma. Included portions the chest and abdomen reveal aortic atherosclerosis and some dependent atelectatic changes without other acute abnormality. Disc levels: Mild multilevel discogenic changes including evidence of desiccation most pronounced T5 to T9 as well as at T10 and 11. Multilevel Schmorl's node formations are noted as well. Minimal posterior spurring of the endplates T11-12. No significant central canal or foraminal stenosis identified within the imaged levels of the thoracic spine. CT LUMBAR SPINE FINDINGS Segmentation: 5 normally formed lumbar levels. Alignment: 3 mm retrolisthesis L2 on L3 favored to be on a facet degenerative basis. No abnormally widened, jumped or perched facets. Vertebrae: No acute vertebral body fracture or height loss. Discogenic and facet degenerative changes as below. The osseous structures appear diffusely demineralized which may limit detection of small or nondisplaced fractures. No suspicious osseous lesions. Paraspinal and other soft tissues: No paraspinal fluid, swelling, gas or hemorrhage. No visible canal hematoma. Included portion of the abdomen and pelvis reveal aortoiliac atherosclerosis. Mild fusiform dilatation of the infrarenal abdominal aorta to 3.2 cm. No acute abnormality posterior abdomen or pelvis. Mild posterior body wall edema. Disc levels: Level by level evaluation of the lumbar spine below: T12-L1: Anterior bridging osteophyte. No significant posterior  disc abnormality. No significant spinal canal or foraminal stenosis. L1-L2: Anterior bridging osteophyte. Disc desiccation. No significant posterior disc abnormality. Mild bilateral facet arthropathy. No significant spinal canal or foraminal stenosis. L2-L3: Retrolisthesis, disc height loss and vacuum disc formation with  exuberant though non bridging anterior osteophyte formation. Mild bilateral facet arthropathy. Resulting moderate canal stenosis and moderate bilateral foraminal narrowing with effacement of the lateral recesses. L3-L4: Disc height loss, desiccation and vacuum disc phenomenon with mild bilateral facet arthropathy. Moderate canal stenosis and bilateral foraminal narrowing. L4-L5: Disc height loss, desiccation with anterior spurring. Moderate bilateral facet arthropathy. Mild to moderate canal stenosis with moderate right and severe left foraminal narrowing and effacement of the lateral recesses. L5-S1: Disc height loss, desiccation and anterior spurring. Moderate bilateral facet arthropathy and ligamentum flavum infolding. Mild canal stenosis and severe right moderate left foraminal narrowing with effacement of the lateral recesses. IMPRESSION: CT THORACIC SPINE IMPRESSION Features of diffuse idiopathic skeletal hyperostosis with a chalk stick fracture extending T9 vertebral body and into the T9-10 disc space with likely disruption of the posterior tension band with associated vertically oriented fracture through the T9 spinous process (AO Spine B2). Minimal adjacent soft tissue thickening. Should be considered an unstable fracture into Additional multilevel degenerative changes without significant canal stenosis or foraminal narrowing. CT LUMBAR SPINE IMPRESSION No acute fracture or vertebral body height loss. Multilevel discogenic and facet degenerative changes, detailed above. Retrolisthesis L2 on L3 is favored to be on a degenerative basis with some resulting moderate canal stenosis. Aortic Atherosclerosis (ICD10-I70.0). 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 Recommend follow-up ultrasound every 3 years. This recommendation follows ACR consensus guidelines: White Paper of the ACR Incidental Findings Committee II on Vascular Findings. J Am Coll Radiol 2013; 10:789-794. These results were  called by telephone at the time of interpretation on 07/29/2020 at 5:41 pm to provider [facility] , who verbally acknowledged these results. Electronically Signed   By: Lovena Le M.D.   On: 07/29/2020 17:43   CT Lumbar Spine Wo Contrast  Result Date: 07/29/2020 CLINICAL DATA:  Falling after stepping off bottom rung of ladder. Right back pain, struck back on ground. Yet key at the EXAM: CT THORACIC AND LUMBAR SPINE WITHOUT CONTRAST TECHNIQUE: Multidetector CT imaging of the thoracic and lumbar spine was performed without contrast. Multiplanar CT image reconstructions were also generated. COMPARISON:  Two-view chest radiograph 07/20/2019 FINDINGS: CT THORACIC SPINE FINDINGS Alignment: Preservation of the normal thoracic kyphosis. No perched or jumped facets. Minimal widening across a fracture line extending through the T9 vertebral body extending into the disc space and through the posterior elements and into the supraspinous ligament. Vertebrae: Multilevel flowing anterior osteophytosis, compatible with features of diffuse idiopathic skeletal hyperostosis (DISH). Partial fusion across multiple spinous processes as well. There is a chalk stick type fracture and extending through the anterior cortex of the T9 vertebrae into the inferior endplate and extending through the T9-T10 disc space with presumed disruption of the posterior tension band given a vertical fracture through the T9 spinous process as well. No clear extension into the T10 vertebral level. No other acute fracture or traumatic osseous injury. The osseous structures appear diffusely demineralized which may limit detection of small or nondisplaced fractures. Paraspinal and other soft tissues: Minimal soft tissue thickening adjacent the T9 fracture. No other visible paraspinal fluid, swelling, gas or hemorrhage. No visible canal hematoma. Included portions the chest and abdomen reveal aortic atherosclerosis and some dependent atelectatic changes  without other acute  abnormality. Disc levels: Mild multilevel discogenic changes including evidence of desiccation most pronounced T5 to T9 as well as at T10 and 11. Multilevel Schmorl's node formations are noted as well. Minimal posterior spurring of the endplates T11-12. No significant central canal or foraminal stenosis identified within the imaged levels of the thoracic spine. CT LUMBAR SPINE FINDINGS Segmentation: 5 normally formed lumbar levels. Alignment: 3 mm retrolisthesis L2 on L3 favored to be on a facet degenerative basis. No abnormally widened, jumped or perched facets. Vertebrae: No acute vertebral body fracture or height loss. Discogenic and facet degenerative changes as below. The osseous structures appear diffusely demineralized which may limit detection of small or nondisplaced fractures. No suspicious osseous lesions. Paraspinal and other soft tissues: No paraspinal fluid, swelling, gas or hemorrhage. No visible canal hematoma. Included portion of the abdomen and pelvis reveal aortoiliac atherosclerosis. Mild fusiform dilatation of the infrarenal abdominal aorta to 3.2 cm. No acute abnormality posterior abdomen or pelvis. Mild posterior body wall edema. Disc levels: Level by level evaluation of the lumbar spine below: T12-L1: Anterior bridging osteophyte. No significant posterior disc abnormality. No significant spinal canal or foraminal stenosis. L1-L2: Anterior bridging osteophyte. Disc desiccation. No significant posterior disc abnormality. Mild bilateral facet arthropathy. No significant spinal canal or foraminal stenosis. L2-L3: Retrolisthesis, disc height loss and vacuum disc formation with exuberant though non bridging anterior osteophyte formation. Mild bilateral facet arthropathy. Resulting moderate canal stenosis and moderate bilateral foraminal narrowing with effacement of the lateral recesses. L3-L4: Disc height loss, desiccation and vacuum disc phenomenon with mild bilateral facet  arthropathy. Moderate canal stenosis and bilateral foraminal narrowing. L4-L5: Disc height loss, desiccation with anterior spurring. Moderate bilateral facet arthropathy. Mild to moderate canal stenosis with moderate right and severe left foraminal narrowing and effacement of the lateral recesses. L5-S1: Disc height loss, desiccation and anterior spurring. Moderate bilateral facet arthropathy and ligamentum flavum infolding. Mild canal stenosis and severe right moderate left foraminal narrowing with effacement of the lateral recesses. IMPRESSION: CT THORACIC SPINE IMPRESSION Features of diffuse idiopathic skeletal hyperostosis with a chalk stick fracture extending T9 vertebral body and into the T9-10 disc space with likely disruption of the posterior tension band with associated vertically oriented fracture through the T9 spinous process (AO Spine B2). Minimal adjacent soft tissue thickening. Should be considered an unstable fracture into Additional multilevel degenerative changes without significant canal stenosis or foraminal narrowing. CT LUMBAR SPINE IMPRESSION No acute fracture or vertebral body height loss. Multilevel discogenic and facet degenerative changes, detailed above. Retrolisthesis L2 on L3 is favored to be on a degenerative basis with some resulting moderate canal stenosis. Aortic Atherosclerosis (ICD10-I70.0). 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 Recommend follow-up ultrasound every 3 years. This recommendation follows ACR consensus guidelines: White Paper of the ACR Incidental Findings Committee II on Vascular Findings. J Am Coll Radiol 2013; 10:789-794. These results were called by telephone at the time of interpretation on 07/29/2020 at 5:41 pm to provider [facility] , who verbally acknowledged these results. Electronically Signed   By: Lovena Le M.D.   On: 07/29/2020 17:43      Assessment/Plan Principal Problem:   Closed wedge compression fracture of T9  vertebra (HCC) Active Problems:   Paresthesia   Essential hypertension   Tobacco use     #1 T9 compression fracture: Considered unstable.  Admit the patient for neurosurgical recommendations.  Pain management.  May likely get surgery on Friday.  Minimize activities for now.  Extensive PT and OT  probably postoperatively.  MRI of the T-spine confirmed the fracture.  #2 essential hypertension: Patient currently takes lisinopril and amlodipine at home.  Will resume.  Blood pressure at this point appears stable.  #3 tobacco abuse: Nicotine patch will be given.  Continue to monitor  #4 status post fall: Purely mechanical.   DVT prophylaxis: Lovenox Code Status: Full code Family Communication: No family at bedside Disposition Plan: To be determined Consults called: Dr. Cari Caraway, neurosurgery Admission status: Inpatient  Severity of Illness: The appropriate patient status for this patient is INPATIENT. Inpatient status is judged to be reasonable and necessary in order to provide the required intensity of service to ensure the patient's safety. The patient's presenting symptoms, physical exam findings, and initial radiographic and laboratory data in the context of their chronic comorbidities is felt to place them at high risk for further clinical deterioration. Furthermore, it is not anticipated that the patient will be medically stable for discharge from the hospital within 2 midnights of admission. The following factors support the patient status of inpatient.   " The patient's presenting symptoms include back pain. " The worrisome physical exam findings include decreased motion. " The initial radiographic and laboratory data are worrisome because of T9 unstable fracture. " The chronic co-morbidities include essential hypertension.   * I certify that at the point of admission it is my clinical judgment that the patient will require inpatient hospital care spanning beyond 2 midnights from  the point of admission due to high intensity of service, high risk for further deterioration and high frequency of surveillance required.Barbette Merino MD [facility] Pager [phone]  If 7PM-7AM, please contact night-coverage [email or web address] Password [facility]  07/29/2020, 8:56 PM

## 2020-07-29 NOTE — ED Notes (Signed)
Patient transported to MRI 

## 2020-07-30 ENCOUNTER — Inpatient Hospital Stay: Payer: Medicare Other | Admitting: Anesthesiology

## 2020-07-30 ENCOUNTER — Inpatient Hospital Stay: Payer: Medicare Other

## 2020-07-30 ENCOUNTER — Encounter
Admission: EM | Disposition: A | Discharge status: Home / Self Care | Payer: Self-pay | Source: Home / Self Care | Attending: Internal Medicine

## 2020-07-30 ENCOUNTER — Encounter: Payer: Self-pay | Admitting: Internal Medicine

## 2020-07-30 ENCOUNTER — Other Ambulatory Visit: Payer: Self-pay

## 2020-07-30 HISTORY — PX: SPINAL FUSION: SHX223

## 2020-07-30 LAB — COMPREHENSIVE METABOLIC PANEL
ALT: 24 U/L (ref 0–44)
AST: 27 U/L (ref 15–41)
Albumin: 4.1 g/dL (ref 3.5–5.0)
Alkaline Phosphatase: 70 U/L (ref 38–126)
Anion gap: 6 (ref 5–15)
BUN: 21 mg/dL (ref 8–23)
CO2: 30 mmol/L (ref 22–32)
Calcium: 9 mg/dL (ref 8.9–10.3)
Chloride: 102 mmol/L (ref 98–111)
Creatinine, Ser: 1.18 mg/dL (ref 0.61–1.24)
GFR, Estimated: >60 mL/min (ref >60)
Glucose, Bld: 179 mg/dL — ABNORMAL HIGH (ref 70–99)
Potassium: 4.1 mmol/L (ref 3.5–5.1)
Sodium: 138 mmol/L (ref 135–145)
Total Bilirubin: 0.8 mg/dL (ref 0.3–1.2)
Total Protein: 7.7 g/dL (ref 6.5–8.1)

## 2020-07-30 LAB — CBC
HCT: 44.1 % (ref 39.0–52.0)
Hemoglobin: 14.8 g/dL (ref 13.0–17.0)
MCH: 31.3 pg (ref 26.0–34.0)
MCHC: 33.6 g/dL (ref 30.0–36.0)
MCV: 93.2 fL (ref 80.0–100.0)
Platelets: 224 10*3/uL (ref 150–400)
RBC: 4.73 MIL/uL (ref 4.22–5.81)
RDW: 13.4 % (ref 11.5–15.5)
WBC: 11.3 10*3/uL — ABNORMAL HIGH (ref 4.0–10.5)
nRBC: 0 % (ref 0.0–0.2)

## 2020-07-30 LAB — MAGNESIUM: Magnesium: 2.2 mg/dL (ref 1.7–2.4)

## 2020-07-30 LAB — TSH: TSH: 1.467 u[IU]/mL (ref 0.350–4.500)

## 2020-07-30 SURGERY — FUSION, SPINE, 2 OR MORE LEVELS, POSTERIOR APPROACH
Anesthesia: General | Site: Spine Thoracic

## 2020-07-30 MED ORDER — BUPIVACAINE-EPINEPHRINE 0.5% -1:200000 IJ SOLN
INTRAMUSCULAR | Status: DC | PRN
  Administered 2020-07-30: 9 mL

## 2020-07-30 MED ORDER — MORPHINE SULFATE (PF) 2 MG/ML IV SOLN: 2.0000 mg | INTRAVENOUS | Status: DC | PRN

## 2020-07-30 MED ORDER — SODIUM CHLORIDE 0.9 % IV SOLN
INTRAVENOUS | Status: DC | PRN
  Administered 2020-07-30: 20 ug/min via INTRAVENOUS

## 2020-07-30 MED ORDER — CEFAZOLIN SODIUM-DEXTROSE 2-3 GM-%(50ML) IV SOLR
INTRAVENOUS | Status: DC | PRN
  Administered 2020-07-30: 2 g via INTRAVENOUS

## 2020-07-30 MED ORDER — FENTANYL CITRATE (PF) 100 MCG/2ML IJ SOLN
INTRAMUSCULAR | Status: AC
  Filled 2020-07-30: qty 2

## 2020-07-30 MED ORDER — ACETAMINOPHEN 10 MG/ML IV SOLN
INTRAVENOUS | Status: DC | PRN
  Administered 2020-07-30: 1000 mg via INTRAVENOUS

## 2020-07-30 MED ORDER — FENTANYL CITRATE (PF) 100 MCG/2ML IJ SOLN
INTRAMUSCULAR | Status: DC | PRN
  Administered 2020-07-30: 100 ug via INTRAVENOUS

## 2020-07-30 MED ORDER — GLYCOPYRROLATE 0.2 MG/ML IJ SOLN
INTRAMUSCULAR | Status: DC | PRN
  Administered 2020-07-30: .2 mg via INTRAVENOUS

## 2020-07-30 MED ORDER — HYDROCHLOROTHIAZIDE 25 MG PO TABS
12.5000 mg | ORAL_TABLET | Freq: Every day | ORAL | Status: DC
  Administered 2020-07-31 – 2020-08-01 (×2): 12.5 mg via ORAL
  Filled 2020-07-30 (×2): qty 1

## 2020-07-30 MED ORDER — LOSARTAN POTASSIUM 50 MG PO TABS
50.0000 mg | ORAL_TABLET | Freq: Every day | ORAL | Status: DC
  Administered 2020-07-30 – 2020-07-31 (×2): 50 mg via ORAL
  Filled 2020-07-30 (×2): qty 1

## 2020-07-30 MED ORDER — FENTANYL CITRATE (PF) 100 MCG/2ML IJ SOLN: 25.0000 ug | INTRAMUSCULAR | Status: DC | PRN

## 2020-07-30 MED ORDER — SODIUM CHLORIDE FLUSH 0.9 % IV SOLN
INTRAVENOUS | Status: AC
  Filled 2020-07-30: qty 20

## 2020-07-30 MED ORDER — BUPIVACAINE HCL (PF) 0.5 % IJ SOLN
INTRAMUSCULAR | Status: AC
  Filled 2020-07-30: qty 30

## 2020-07-30 MED ORDER — THROMBIN 5000 UNITS EX SOLR
CUTANEOUS | Status: DC | PRN
  Administered 2020-07-30: 5000 [IU] via TOPICAL

## 2020-07-30 MED ORDER — REMIFENTANIL HCL 1 MG IV SOLR
INTRAVENOUS | Status: DC | PRN
  Administered 2020-07-30: .1 ug/kg/min via INTRAVENOUS

## 2020-07-30 MED ORDER — PROPOFOL 10 MG/ML IV BOLUS
INTRAVENOUS | Status: DC | PRN
  Administered 2020-07-30: 140 mg via INTRAVENOUS

## 2020-07-30 MED ORDER — OXYCODONE HCL 5 MG PO TABS
5.0000 mg | ORAL_TABLET | ORAL | Status: DC | PRN
  Administered 2020-07-30: 5 mg via ORAL
  Administered 2020-07-31 – 2020-08-01 (×6): 10 mg via ORAL
  Filled 2020-07-30: qty 2
  Filled 2020-07-30: qty 1
  Filled 2020-07-30 (×5): qty 2

## 2020-07-30 MED ORDER — SUCCINYLCHOLINE CHLORIDE 20 MG/ML IJ SOLN
INTRAMUSCULAR | Status: DC | PRN
  Administered 2020-07-30: 120 mg via INTRAVENOUS

## 2020-07-30 MED ORDER — METHOCARBAMOL 500 MG PO TABS
500.0000 mg | ORAL_TABLET | Freq: Four times a day (QID) | ORAL | Status: DC | PRN
  Administered 2020-07-30 – 2020-08-01 (×4): 500 mg via ORAL
  Filled 2020-07-30 (×4): qty 1

## 2020-07-30 MED ORDER — BUPIVACAINE-EPINEPHRINE (PF) 0.5% -1:200000 IJ SOLN
INTRAMUSCULAR | Status: AC
  Filled 2020-07-30: qty 30

## 2020-07-30 MED ORDER — SODIUM CHLORIDE 0.9% FLUSH: INTRAVENOUS | Status: DC | PRN

## 2020-07-30 MED ORDER — HYDROMORPHONE HCL 1 MG/ML IJ SOLN
0.5000 mg | INTRAMUSCULAR | Status: DC | PRN
Start: 2020-07-30 — End: 2020-07-31
  Administered 2020-07-31: 0.5 mg via INTRAVENOUS
  Filled 2020-07-30: qty 1

## 2020-07-30 MED ORDER — DILTIAZEM HCL 30 MG PO TABS
15.0000 mg | ORAL_TABLET | Freq: Four times a day (QID) | ORAL | Status: DC
  Administered 2020-07-30 – 2020-08-01 (×7): 15 mg via ORAL
  Filled 2020-07-30 (×7): qty 1

## 2020-07-30 MED ORDER — REMIFENTANIL HCL 1 MG IV SOLR
INTRAVENOUS | Status: AC
  Filled 2020-07-30: qty 1000

## 2020-07-30 MED ORDER — ONDANSETRON HCL 4 MG/2ML IJ SOLN
INTRAMUSCULAR | Status: AC
  Filled 2020-07-30: qty 2

## 2020-07-30 MED ORDER — MELOXICAM 7.5 MG PO TABS: 15.0000 mg | ORAL_TABLET | Freq: Every day | ORAL | Status: DC

## 2020-07-30 MED ORDER — EPHEDRINE SULFATE 50 MG/ML IJ SOLN
INTRAMUSCULAR | Status: DC | PRN
  Administered 2020-07-30 (×3): 10 mg via INTRAVENOUS

## 2020-07-30 MED ORDER — BUPIVACAINE LIPOSOME 1.3 % IJ SUSP
INTRAMUSCULAR | Status: AC
  Filled 2020-07-30: qty 20

## 2020-07-30 MED ORDER — DEXAMETHASONE SODIUM PHOSPHATE 10 MG/ML IJ SOLN
INTRAMUSCULAR | Status: DC | PRN
  Administered 2020-07-30: 10 mg via INTRAVENOUS

## 2020-07-30 MED ORDER — SODIUM CHLORIDE 0.9 % IV SOLN
INTRAVENOUS | Status: DC | PRN
  Administered 2020-07-30: 40 mL

## 2020-07-30 MED ORDER — 0.9 % SODIUM CHLORIDE (POUR BTL) OPTIME
TOPICAL | Status: DC | PRN
  Administered 2020-07-30: 500 mL

## 2020-07-30 MED ORDER — BUPIVACAINE HCL (PF) 0.5 % IJ SOLN
INTRAMUSCULAR | Status: DC | PRN
  Administered 2020-07-30: 20 mL

## 2020-07-30 MED ORDER — THROMBIN 5000 UNITS EX SOLR
CUTANEOUS | Status: AC
  Filled 2020-07-30: qty 5000

## 2020-07-30 MED ORDER — ATORVASTATIN CALCIUM 20 MG PO TABS
40.0000 mg | ORAL_TABLET | Freq: Every day | ORAL | Status: DC
  Administered 2020-07-30 – 2020-08-01 (×3): 40 mg via ORAL
  Filled 2020-07-30 (×3): qty 2

## 2020-07-30 MED ORDER — DEXAMETHASONE SODIUM PHOSPHATE 10 MG/ML IJ SOLN
INTRAMUSCULAR | Status: AC
  Filled 2020-07-30: qty 1

## 2020-07-30 MED ORDER — LOSARTAN POTASSIUM 50 MG PO TABS
50.0000 mg | ORAL_TABLET | Freq: Every day | ORAL | Status: DC
  Administered 2020-07-30: 50 mg via ORAL
  Filled 2020-07-30: qty 1

## 2020-07-30 MED ORDER — CELECOXIB 100 MG PO CAPS
100.0000 mg | ORAL_CAPSULE | Freq: Two times a day (BID) | ORAL | Status: DC
  Administered 2020-07-30 – 2020-08-01 (×4): 100 mg via ORAL
  Filled 2020-07-30 (×5): qty 1

## 2020-07-30 MED ORDER — ACETAMINOPHEN 10 MG/ML IV SOLN
INTRAVENOUS | Status: AC
  Filled 2020-07-30: qty 100

## 2020-07-30 MED ORDER — ONDANSETRON HCL 4 MG/2ML IJ SOLN: 4.0000 mg | Freq: Once | INTRAMUSCULAR | Status: DC | PRN

## 2020-07-30 MED ORDER — ONDANSETRON HCL 4 MG/2ML IJ SOLN
INTRAMUSCULAR | Status: DC | PRN
  Administered 2020-07-30: 4 mg via INTRAVENOUS

## 2020-07-30 MED ORDER — LIDOCAINE HCL (CARDIAC) PF 100 MG/5ML IV SOSY
PREFILLED_SYRINGE | INTRAVENOUS | Status: DC | PRN
  Administered 2020-07-30: 100 mg via INTRAVENOUS

## 2020-07-30 SURGICAL SUPPLY — 72 items
"PENCIL ELECTRO HAND CTR " (MISCELLANEOUS) ×1 IMPLANT
BONE CANC CHIPS 40CC CAN1/2 (Bone Implant) ×3 IMPLANT
CAP LOCKING THREADED (Cap) ×20 IMPLANT
CHIPS CANC BONE 40CC CAN1/2 (Bone Implant) ×2 IMPLANT
CHLORAPREP W/TINT 26 (MISCELLANEOUS) ×12 IMPLANT
CORD BIP STRL DISP 12FT (MISCELLANEOUS) ×3 IMPLANT
COUNTER NEEDLE 20/40 LG (NEEDLE) ×6 IMPLANT
COVER BACK TABLE REUSABLE LG (DRAPES) ×3 IMPLANT
CUP MEDICINE 2OZ PLAST GRAD ST (MISCELLANEOUS) ×3 IMPLANT
DERMABOND ADVANCED (GAUZE/BANDAGES/DRESSINGS) ×2
DERMABOND ADVANCED .7 DNX12 (GAUZE/BANDAGES/DRESSINGS) ×4 IMPLANT
DRAPE C ARM PK CFD 31 SPINE (DRAPES) ×3 IMPLANT
DRAPE C-ARMOR (DRAPES) ×4 IMPLANT
DRAPE INCISE IOBAN 66X45 STRL (DRAPES) ×6 IMPLANT
DRAPE LAPAROTOMY 100X77 ABD (DRAPES) ×6 IMPLANT
DRAPE SURG 17X11 SM STRL (DRAPES) ×24 IMPLANT
DRSG OPSITE POSTOP 4X6 (GAUZE/BANDAGES/DRESSINGS) IMPLANT
DRSG TEGADERM 2-3/8X2-3/4 SM (GAUZE/BANDAGES/DRESSINGS) IMPLANT
DRSG TEGADERM 4X4.75 (GAUZE/BANDAGES/DRESSINGS) ×4 IMPLANT
DRSG TEGADERM 6X8 (GAUZE/BANDAGES/DRESSINGS) IMPLANT
DRSG TELFA 3X8 NADH (GAUZE/BANDAGES/DRESSINGS) IMPLANT
DRSG TELFA 4X3 1S NADH ST (GAUZE/BANDAGES/DRESSINGS) IMPLANT
ELECT CAUTERY BLADE TIP 2.5 (TIP) ×3
ELECT EZSTD 165MM 6.5IN (MISCELLANEOUS) ×3
ELECT REM PT RETURN 9FT ADLT (ELECTROSURGICAL) ×6
ELECTRODE CAUTERY BLDE TIP 2.5 (TIP) ×2 IMPLANT
ELECTRODE EZSTD 165MM 6.5IN (MISCELLANEOUS) ×2 IMPLANT
ELECTRODE REM PT RTRN 9FT ADLT (ELECTROSURGICAL) ×4 IMPLANT
FEE INTRAOP CADWELL SUPPLY NCS (MISCELLANEOUS) ×2 IMPLANT
GAUZE 4X4 16PLY ~~LOC~~+RFID DBL (SPONGE) ×3 IMPLANT
GLOVE SURG SYN 8.5  E (GLOVE) ×12
GLOVE SURG SYN 8.5 E (GLOVE) ×12 IMPLANT
GLOVE SURG SYN 8.5 PF PI (GLOVE) ×6 IMPLANT
GOWN SRG XL LVL 3 NONREINFORCE (GOWNS) ×4 IMPLANT
GOWN STRL NON-REIN TWL XL LVL3 (GOWNS) ×4
GOWN STRL REUS W/ TWL XL LVL3 (GOWN DISPOSABLE) ×2 IMPLANT
GOWN STRL REUS W/TWL XL LVL3 (GOWN DISPOSABLE) ×2
GRADUATE 1200CC STRL 31836 (MISCELLANEOUS) ×3 IMPLANT
GRAFT BNE CHIP CANC 1-8 40 (Bone Implant) IMPLANT
INTRAOP CADWELL SUPPLY FEE NCS (MISCELLANEOUS) ×2
INTRAOP DISP SUPPLY FEE NCS (MISCELLANEOUS) ×2
KIT SPINAL PRONEVIEW (KITS) ×3 IMPLANT
KIT TURNOVER KIT A (KITS) ×3 IMPLANT
MANIFOLD NEPTUNE II (INSTRUMENTS) ×3 IMPLANT
MARKER SKIN DUAL TIP RULER LAB (MISCELLANEOUS) ×9 IMPLANT
NDL SAFETY ECLIPSE 18X1.5 (NEEDLE) ×2 IMPLANT
NEEDLE HYPO 18GX1.5 SHARP (NEEDLE) ×1
NEEDLE HYPO 22GX1.5 SAFETY (NEEDLE) ×3 IMPLANT
PACK LAMINECTOMY NEURO (CUSTOM PROCEDURE TRAY) ×3 IMPLANT
PAD ARMBOARD 7.5X6 YLW CONV (MISCELLANEOUS) ×9 IMPLANT
PAD DRESSING TELFA 3X8 NADH (GAUZE/BANDAGES/DRESSINGS) IMPLANT
PENCIL ELECTRO HAND CTR (MISCELLANEOUS) ×3 IMPLANT
PREVENA INCISION MGT 90 150 (MISCELLANEOUS) ×2 IMPLANT
PUTTY DBX 10CC (Bone Implant) ×2 IMPLANT
ROD SPINE STR CREO 5.5X150 (Rod) ×4 IMPLANT
SCREW CREO SPINAL 6.5X45 (Screw) ×20 IMPLANT
SPOGE SURGIFLO 8M (HEMOSTASIS) ×2
SPONGE GAUZE 2X2 8PLY STRL LF (GAUZE/BANDAGES/DRESSINGS) IMPLANT
SPONGE SURGIFLO 8M (HEMOSTASIS) ×2 IMPLANT
STAPLER SKIN PROX 35W (STAPLE) IMPLANT
SUT DVC VLOC 3-0 CL 6 P-12 (SUTURE) ×5 IMPLANT
SUT ETHILON 3-0 FS-10 30 BLK (SUTURE) ×3
SUT VIC AB 0 CT1 18XCR BRD 8 (SUTURE) ×3 IMPLANT
SUT VIC AB 0 CT1 27 (SUTURE) ×3
SUT VIC AB 0 CT1 27XCR 8 STRN (SUTURE) ×2 IMPLANT
SUT VIC AB 0 CT1 8-18 (SUTURE) ×3
SUT VIC AB 2-0 CT1 18 (SUTURE) ×5 IMPLANT
SUTURE EHLN 3-0 FS-10 30 BLK (SUTURE) ×1 IMPLANT
SYR 30ML LL (SYRINGE) ×6 IMPLANT
TOWEL OR 17X26 4PK STRL BLUE (TOWEL DISPOSABLE) ×9 IMPLANT
TRAY FOLEY MTR SLVR 16FR STAT (SET/KITS/TRAYS/PACK) ×2 IMPLANT
TUBING CONNECTING 10 (TUBING) ×9 IMPLANT

## 2020-07-30 NOTE — Transfer of Care (Signed)
Immediate Anesthesia Transfer of Care Note  Patient: Jacob Velasquez  Procedure(s) Performed: FUSION POSTERIOR SPINAL MULTILEVEL Posterior Thoracic Fusion T7-T11 (Spine Thoracic)  Patient Location: PACU  Anesthesia Type:General  Level of Consciousness: awake and patient cooperative  Airway & Oxygen Therapy: Patient Spontanous Breathing and Patient connected to nasal cannula oxygen  Post-op Assessment: Report given to RN, Post -op Vital signs reviewed and stable and Patient moving all extremities X 4  Post vital signs: Reviewed  Last Vitals:  Vitals Value Taken Time  BP    Temp    Pulse 105 07/30/20 2043  Resp 11 07/30/20 2043  SpO2 96 % 07/30/20 2043  Vitals shown include unvalidated device data.  Last Pain:  Vitals:   07/30/20 1624  TempSrc:   PainSc: 0-No pain      Patients Stated Pain Goal: 0 (21/97/58 8325)  Complications: No notable events documented.

## 2020-07-30 NOTE — Op Note (Signed)
Indications: Jacob Velasquez is a 75 yo male who presented with an unstable T9 burst fracture from a fall.  He has thoracic spinal instability.  Due to this, fixation was recommended  Findings: fixation of the T9 fracture  Preoperative Diagnosis: Thoracic spinal instability; unstable closed T9 burst fracture Postoperative Diagnosis: same   EBL: 100 ml IVF: 1200 ml Drains: 1 placed Disposition: Extubated and Stable to PACU Complications: none  A foley catheter was placed.   Preoperative Note:   Risks of surgery discussed include: infection, bleeding, stroke, coma, death, paralysis, CSF leak, nerve/spinal cord injury, numbness, tingling, weakness, complex regional pain syndrome, recurrent stenosis and/or disc herniation, vascular injury, development of instability, neck/back pain, need for further surgery, persistent symptoms, development of deformity, and the risks of anesthesia. The patient understood these risks and agreed to proceed.  Operative Note:  1. Open reduction and internal Fixation T9 fracture 2. Posterolateral arthrodesis T7-11 3. Posterior segmental instrumentation T7-11 using Globus Creo    The patient was brought to the Operating Room, intubated and turned into the prone position. All pressure points were checked and double checked. Flouroscopy was used to mark the incision. The patient was prepped and draped in the standard fashion. A full timeout was performed. Preoperative antibiotics were given. The incision was injected with local anesthetic.  The incision was opened with a scalpel, then the soft tissues divided with the Bovie. Self-retaining retractors were placed. The paraspinus muscles were reflected laterally in subperiosteal fashion until the transverse processes were visible. Flouroscopy was used to confirm our localization.   The self-retaining retractors were repositioned. We then placed pedicle screws.   At T7 on one side, a starting point was chosen based on  anatomic landmarks, then breached with a high speed drill. A pedicle finder probe was used to cannulate the pedicle, then the balltip probe used to confirm lack of breach. The tract was tapped, re-checked with the balltip probe, then a 6.5 x 45 mm pedicle screw was placed. The procedure was then repeated contralaterally and the same size screw placed.  At T8 on one side, a starting point was chosen based on anatomic landmarks, then breached with a high speed drill. A pedicle finder probe was used to cannulate the pedicle, then the balltip probe used to confirm lack of breach. The tract was tapped, re-checked with the balltip probe, then a 6.5 x 45 mm pedicle screw was placed. The procedure was then repeated contralaterally and the same size screw placed.  At T9 on one side, a starting point was chosen based on anatomic landmarks, then breached with a high speed drill. A pedicle finder probe was used to cannulate the pedicle, then the balltip probe used to confirm lack of breach. The tract was tapped, re-checked with the balltip probe, then a 6.5 x 45 mm pedicle screw was placed. The procedure was then repeated contralaterally and the same size screw placed.  At T10 on one side, a starting point was chosen based on anatomic landmarks, then breached with a high speed drill. A pedicle finder probe was used to cannulate the pedicle, then the balltip probe used to confirm lack of breach. The tract was tapped, re-checked with the balltip probe, then a 6.5 x 45 mm pedicle screw was placed. The procedure was then repeated contralaterally and the same size screw placed.  At T11 on one side, a starting point was chosen based on anatomic landmarks, then breached with a high speed drill. A pedicle finder probe was  used to cannulate the pedicle, then the balltip probe used to confirm lack of breach. The tract was tapped, re-checked with the balltip probe, then a 6.5 x 45 mm pedicle screw was placed. The procedure was  then repeated contralaterally and the same size screw placed.  Rods were measured to length, cut, and shaped. The rods were secured using locking caps to manufacturer's specifications. Final AP and lateral radiographs were taken to confirm placement of instrumentation and appropriate alignment.   By reducing the screws to the rod, the T9 fracture was reduced and internally fixated.  The wound was copiously irrigated, then the external surfaces of the remaining lamina, facet, and transverse processes from T7 to T11 were decorticated. A mixture of allograft and autograft was placed over the decorticated surfaces for arthrodesis.  A drain was placed subfascially.   After hemostasis, the wound was closed in layers with 0 and 2-0 vicryl. 3-0 monocryl and a wound vac were applied to the incision.  The patient was then flipped supine and extubated with incident. All counts were correct times 2 at the end of the case. No immediate complications were noted.  Meade Maw MD

## 2020-07-30 NOTE — Progress Notes (Signed)
PROGRESS NOTE  Jacob Velasquez  DOB: 15-Jul-1945  PCP: Baxter Hire, MD OMV:[id number]  DOA: 07/29/2020  LOS: 1 day  Hospital Day: 2   Chief Complaint  Patient presents with   Fall   Back Pain   Brief narrative: Jacob Velasquez is a 75 y.o. male with PMH significant for HTN, HLD, smoking, previous TIA. Patient was brought to the ED after a traumatic fall from a ladder following which he had a significant low back pain and mid back pain and difficulty with any movement.   In the ED, patient was hemodynamically stable CT head, cervical spine and lumbar spine were unremarkable CT thoracic spine showed diffuse idiopathic skeletal hyperostosis with a chalk stick fracture extending T9 vertebral body into T9-10 disc space with likely disruption of the posterior tension band with associated vertically oriented fracture through the T9 spinous process. Neurosurgery Dr. Cari Caraway was consulted from the ED Admitted to hospitalist service with tentative plan of surgical fixation on 6/31.  Subjective: Patient was seen and examined this morning.  Pleasant elderly Caucasian male.  Sitting up in the stretcher in the ED.  Seems to be in pain and tossing around, but does not want pain medicine at this time.  Assessment/Plan: Unstable T9 compression fracture -Presented with fall leading to back pain. -CT thoracic spine finding as above showing a stable T9 compression fracture.  Neurosurgery consultation was called.  Tentative plan of surgical fixation on 6/31 -Needs PT/OT eval postprocedure. -Pain control for now with IV morphine PRN. -Denies any gait instability or balance issues prior to this presentation.  Essential hypertension -On lisinopril and HCTZ at home.  Keep them on hold for now. Hydralazine PRN. -Continue to monitor blood pressure  HLD - Lipitor to continue  Current everyday smoker -Counseled to quit. Nicotine patch offered.  Mobility: Minimal activities for now.  PT/OT eval postprocedure Code Status:   Code Status: Full Code  Nutritional status: Body mass index is 28.25 kg/m.     Diet:  Diet Order             Diet NPO time specified  Diet effective midnight           Diet Heart Room service appropriate? Yes; Fluid consistency: Thin  Diet effective now                  DVT prophylaxis:  enoxaparin (LOVENOX) injection 40 mg Start: 07/29/20 2300   Antimicrobials: None Fluid: Currently on LR@100  mill per hour.  Reduce to 50 mill per hour Consultants: Neurosurgery Family Communication: None at bedside  Status is: Inpatient  Remains inpatient appropriate because: Pending neurosurgery  Dispo: The patient is from: Home              Anticipated d/c is to: Given in PT/OT findings              Patient currently is not medically stable to d/c.   Difficult to place patient No     Infusions:   lactated ringers 50 mL/hr at 07/30/20 0819   methocarbamol (ROBAXIN) IV      Scheduled Meds:  atorvastatin  40 mg Oral Daily   enoxaparin (LOVENOX) injection  40 mg Subcutaneous Q24H   nicotine  21 mg Transdermal Daily    Antimicrobials: Anti-infectives (From admission, onward)    None       PRN meds: methocarbamol (ROBAXIN) IV, morphine injection, ondansetron **OR** ondansetron (ZOFRAN) IV   Objective: Vitals:   07/29/20 1434 07/30/20  0548  BP: 138/84 138/89  Pulse: 66 77  Resp: 20 18  Temp: 98 F (36.7 C)   SpO2: 97% 97%   No intake or output data in the 24 hours ending 07/30/20 0937 Filed Weights   07/29/20 1448  Weight: 99.8 kg   Weight change:  Body mass index is 28.25 kg/m.   Physical Exam: General exam: Pleasant elderly Caucasian male.  Sitting up in stretcher.  In mild to moderate pain from fracture.  Back brace in place Skin: No rashes, lesions or ulcers. HEENT: Atraumatic, normocephalic, no obvious bleeding Lungs: Clear to auscultation bilaterally CVS: Regular rate and rhythm, no murmur GI/Abd soft,  nontender, nondistended, bowel sound present CNS: Alert, awake, oriented x3 Psychiatry: Mood appropriate Extremities: No pedal edema, no calf tenderness  Data Review: I have personally reviewed the laboratory data and studies available.  Recent Labs  Lab 07/29/20 2310 07/30/20 0601  WBC 9.0 11.3*  HGB 15.2 14.8  HCT 45.8 44.1  MCV 93.7 93.2  PLT 224 224   Recent Labs  Lab 07/29/20 2310 07/30/20 0601  NA  --  138  K  --  4.1  CL  --  102  CO2  --  30  GLUCOSE  --  179*  BUN  --  21  CREATININE 1.25* 1.18  CALCIUM  --  9.0    F/u labs ordered Unresulted Labs (From admission, onward)     Start     Ordered   08/05/20 0500  Creatinine, serum  (enoxaparin (LOVENOX)    CrCl >/= 30 ml/min)  Weekly,   STAT     Comments: while on enoxaparin therapy    07/29/20 2245            Signed, Terrilee Croak, MD [facility] 07/30/2020

## 2020-07-30 NOTE — Anesthesia Preprocedure Evaluation (Signed)
Anesthesia Evaluation  Patient identified by MRN, date of birth, ID band Patient awake    Reviewed: Allergy & Precautions, H&P , NPO status , Patient's Chart, lab work & pertinent test results, reviewed documented beta blocker date and time   Airway Mallampati: II  TM Distance: >3 FB Neck ROM: full    Dental  (+) Teeth Intact   Pulmonary neg pulmonary ROS, Current Smoker,    Pulmonary exam normal        Cardiovascular Exercise Tolerance: Poor hypertension, On Medications negative cardio ROS Normal cardiovascular exam Rhythm:regular Rate:Normal     Neuro/Psych TIAnegative psych ROS   GI/Hepatic negative GI ROS, Neg liver ROS,   Endo/Other  negative endocrine ROS  Renal/GU negative Renal ROS  negative genitourinary   Musculoskeletal   Abdominal   Peds  Hematology negative hematology ROS (+)   Anesthesia Other Findings Past Medical History: No date: Hypertension History reviewed. No pertinent surgical history. BMI    Body Mass Index: 28.25 kg/m     Reproductive/Obstetrics negative OB ROS                             Anesthesia Physical Anesthesia Plan  ASA: 2  Anesthesia Plan: General ETT   Post-op Pain Management:    Induction:   PONV Risk Score and Plan:   Airway Management Planned:   Additional Equipment:   Intra-op Plan:   Post-operative Plan:   Informed Consent: I have reviewed the patients History and Physical, chart, labs and discussed the procedure including the risks, benefits and alternatives for the proposed anesthesia with the patient or authorized representative who has indicated his/her understanding and acceptance.     Dental Advisory Given  Plan Discussed with: CRNA  Anesthesia Plan Comments:         Anesthesia Quick Evaluation

## 2020-07-30 NOTE — H&P (View-Only) (Signed)
Referring Physician:  No referring provider defined for this encounter.  Primary Physician:  Baxter Hire, MD  Chief Complaint:  T9-10 fracture  History of Present Illness: 07/30/2020 Malyk Girouard is a 75 y.o. male who presents with the chief complaint of fall while doing work at home.  He slipped on a ladder and had immediate pain.  He came to ER after his pain continued despite 2 days of waiting.  He is feeling somewhat better this morning with pain meds, but continues to have severe pain with certain movements.  He has no other neurologic complaints.   Review of Systems:  A 10 point review of systems is negative, except for the pertinent positives and negatives detailed in the HPI.  Past Medical History: Past Medical History:  Diagnosis Date   Hypertension     Past Surgical History: No past surgical history on file.  Allergies: Allergies as of 07/29/2020   (No Known Allergies)    Medications:  Current Facility-Administered Medications:    amLODipine (NORVASC) tablet 5 mg, 5 mg, Oral, Daily, Garba, Mohammad L, MD   atorvastatin (LIPITOR) tablet 40 mg, 40 mg, Oral, Daily, Renda Rolls, RPH, 40 mg at 07/30/20 0616   enoxaparin (LOVENOX) injection 40 mg, 40 mg, Subcutaneous, Q24H, Garba, Mohammad L, MD   HYDROmorphone (DILAUDID) injection 0.5-1 mg, 0.5-1 mg, Intravenous, Q2H PRN, Gala Romney L, MD, 1 mg at 07/29/20 2339   lactated ringers infusion, , Intravenous, Continuous, Dahal, Binaya, MD, Last Rate: 100 mL/hr at 07/30/20 0550, New Bag at 07/30/20 0550   losartan (COZAAR) tablet 50 mg, 50 mg, Oral, Daily, Renda Rolls, RPH, 50 mg at 07/30/20 0615   methocarbamol (ROBAXIN) 500 mg in dextrose 5 % 50 mL IVPB, 500 mg, Intravenous, Q6H PRN, Jonelle Sidle, Mohammad L, MD   nicotine (NICODERM CQ - dosed in mg/24 hours) patch 21 mg, 21 mg, Transdermal, Daily, Garba, Mohammad L, MD   ondansetron (ZOFRAN) tablet 4 mg, 4 mg, Oral, Q6H PRN **OR** ondansetron (ZOFRAN)  injection 4 mg, 4 mg, Intravenous, Q6H PRN, Jonelle Sidle, Mohammad L, MD, 4 mg at 07/30/20 0614  Current Outpatient Medications:    aspirin EC 81 MG EC tablet, Take 1 tablet (81 mg total) by mouth daily. Swallow whole., Disp: 30 tablet, Rfl: 11   atorvastatin (LIPITOR) 40 MG tablet, Take 1 tablet (40 mg total) by mouth daily., Disp: 30 tablet, Rfl: 0   hydrochlorothiazide (HYDRODIURIL) 12.5 MG tablet, Take 12.5 mg by mouth daily., Disp: , Rfl:    losartan (COZAAR) 50 MG tablet, Take 50 mg by mouth daily., Disp: , Rfl:    meloxicam (MOBIC) 15 MG tablet, Take 15 mg by mouth daily., Disp: , Rfl:    Social History: Social History   Tobacco Use   Smoking status: Some Days    Pack years: 0.00    Types: Cigarettes   Smokeless tobacco: Never  Substance Use Topics   Alcohol use: Never   Drug use: Never    Family Medical History: No family history on file.  Physical Examination: Vitals:   07/29/20 1434 07/30/20 0548  BP: 138/84 138/89  Pulse: 66 77  Resp: 20 18  Temp: 98 F (36.7 C)   SpO2: 97% 97%     General: Patient is well developed, well nourished, calm, collected, and in obvious distress with certain movements.  Psychiatric: Patient is non-anxious.  Head:  Pupils equal, round, and reactive to light.  ENT:  Oral mucosa appears well hydrated.  Neck:  Supple.  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  Respiratory: Patient is breathing without any difficulty.  Extremities: No edema.  Vascular: Palpable pulses in dorsal pedal vessels.  Skin:   On exposed skin, there are no abnormal skin lesions.  NEUROLOGICAL:  General: In no acute distress.   Awake, alert, oriented to person, place, and time.  Pupils equal round and reactive to light.  Facial tone is symmetric.  Tongue protrusion is midline.  There is no pronator drift.  ROM of spine: untested - fracture.  Strength: Side Biceps Triceps Deltoid Interossei Grip Wrist Ext. Wrist Flex.  R 5 5 5 5 5 5 5   L 5 5 5 5 5 5 5     Side Iliopsoas Quads Hamstring PF DF EHL  R 5 5 5 5 5 5   L 5 5 5 5 5 5     Bilateral upper and lower extremity sensation is intact to light touch. Reflexes are 1 and symmetric at the biceps, triceps, brachioradialis, patella and achilles. Hoffman's is absent.  Clonus is not present.  Toes are down-going.    Gait is untested.   Imaging: CT TL spine 07/29/20 IMPRESSION: CT THORACIC SPINE IMPRESSION   Features of diffuse idiopathic skeletal hyperostosis with a chalk stick fracture extending T9 vertebral body and into the T9-10 disc space with likely disruption of the posterior tension band with associated vertically oriented fracture through the T9 spinous process (AO Spine B2). Minimal adjacent soft tissue thickening. Should be considered an unstable fracture into   Additional multilevel degenerative changes without significant canal stenosis or foraminal narrowing.   CT LUMBAR SPINE IMPRESSION   No acute fracture or vertebral body height loss.   Multilevel discogenic and facet degenerative changes, detailed above.   Retrolisthesis L2 on L3 is favored to be on a degenerative basis with some resulting moderate canal stenosis.   Aortic Atherosclerosis (ICD10-I70.0).   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 Recommend follow-up ultrasound every 3 years. This recommendation follows ACR consensus guidelines: White Paper of the ACR Incidental Findings Committee II on Vascular Findings. J Am Coll Radiol 2013; 10:789-794.   These results were called by telephone at the time of interpretation on 07/29/2020 at 5:41 pm to provider [facility] , who verbally acknowledged these results.     Electronically Signed   By: Lovena Le M.D.   On: 07/29/2020 17:43  MRI T spine 07/29/20 IMPRESSION: 1. Oblique fracture of the T9 vertebral body extending from the anterior superior margin of the vertebral body to the inferior central endplate, nondisplaced.  There is also a fracture of the tip of the T9 spinous process separating the spinous process from the flowing osteophytes along the supraspinous ligament. Both of these fractures were visible at CT. No subluxation is identified. 2. There is also a subtle nondisplaced fracture of the spinous process of T8. 3. Diffuse idiopathic skeletal hyperostosis. 4. Thoracic spondylosis and degenerative disc disease along with prominence of the dorsal epidural adipose tissues contribute to moderate central narrowing of the thecal sac at T8-9 and mild central narrowing of the thecal sac at T7-8 and T11-12.     Electronically Signed   By: Van Clines M.D.   On: 07/29/2020 21:07    I have personally reviewed the images and agree with the above interpretation.  Labs: CBC Latest Ref Rng & Units 07/30/2020 07/29/2020 01/01/2020  WBC 4.0 - 10.5 K/uL 11.3(H) 9.0 7.7  Hemoglobin 13.0 - 17.0 g/dL 14.8 15.2 15.3  Hematocrit 39.0 - 52.0 % 44.1 45.8 46.2  Platelets 150 - 400 K/uL 224 224 256       Assessment and Plan: Mr. Witt is a pleasant 75 y.o. male with unstable T9-10 fracture with ALL disruption.  Due to instability of the thoracic spine from this fracture, I recommended surgical intervention.  We will plan for T7-11 or T7-12 PSF and ORIF of the fracture.  Due to current utilization of the operating room, the first available operating time is tomorrow.    I discussed the planned procedure at length with the patient, including the risks, benefits, alternatives, and indications. The risks discussed include but are not limited to bleeding, infection, need for reoperation, spinal fluid leak, stroke, vision loss, anesthetic complication, coma, paralysis, and even death. I also described in detail that improvement was not guaranteed.  The patient expressed understanding of these risks, and asked that we proceed with surgery. I described the surgery in layman's terms, and gave ample opportunity for  questions, which were answered to the best of my ability.  Recommend bedrest in a brace until surgery.  Nichalas Coin K. Izora Ribas MD, [facility] of [facility]

## 2020-07-30 NOTE — Anesthesia Procedure Notes (Signed)
Procedure Name: Intubation Date/Time: 07/30/2020 5:28 PM Performed by: Hedda Slade, CRNA Pre-anesthesia Checklist: Patient identified, Patient being monitored, Timeout performed, Emergency Drugs available and Suction available Patient Re-evaluated:Patient Re-evaluated prior to induction Oxygen Delivery Method: Circle system utilized Preoxygenation: Pre-oxygenation with 100% oxygen Induction Type: IV induction Ventilation: Mask ventilation without difficulty Laryngoscope Size: 4 and McGraph Grade View: Grade II Tube type: Oral Tube size: 7.5 mm Number of attempts: 1 Airway Equipment and Method: Stylet and Video-laryngoscopy Placement Confirmation: ETT inserted through vocal cords under direct vision, positive ETCO2 and breath sounds checked- equal and bilateral Secured at: 22 cm Tube secured with: Tape Dental Injury: Teeth and Oropharynx as per pre-operative assessment  Difficulty Due To: Difficulty was anticipated and Difficult Airway- due to dentition Comments: Unable to appropriately position patient due to spinal fixation/brace. Multiple providers present for induction, Grade II view with mcgrath.

## 2020-07-30 NOTE — Interval H&P Note (Signed)
History and Physical Interval Note:  07/30/2020 4:59 PM  Jacob Velasquez  has presented today for surgery, with the diagnosis of T9 fracture.  The various methods of treatment have been discussed with the patient and family. After consideration of risks, benefits and other options for treatment, the patient has consented to  [name] as a surgical intervention.  The patient's history has been reviewed, patient examined, no change in status, stable for surgery.  I have reviewed the patient's chart and labs.  Questions were answered to the patient's satisfaction.    Heart RRR no MRG. Chest clear.   [name]

## 2020-07-30 NOTE — Progress Notes (Signed)
0844-  pt arrived in pacu in afib with mild rvr.  Dr Andree Elk called and informed. EKG performed at 2050.  Dr Andree Elk at bedside. VSS at this time.  Afib does appear to be new onset.  Per dr Andree Elk will notify hospitalist.  Pt also has dysarthria and upper lip swelling. Unsure of baseline but no assessment found noting dysarthria. No other focal deficits.    0857-received call from dr Sidney Ace with internal medicine team.  Informed him of lip swelling, speech changes, mild confusion, as well as new afib.  CT ordered by MD   0920-pt taken down by this RN and candy RN to CT scanner.    Son was at bed prior to going to CT and reports the speech was not normal for pt.    When returned from CT speech seems to be improving some as well as confusion.   Dr Izora Ribas was updated about pt per dr Sidney Ace request.  Relayed to dr Sidney Ace that per dr Izora Ribas no anticoagulation for preferably 2 weeks but at least 1 week.  Dr Izora Ribas report was ok with daily ASA and preventive lovenox if needed but no blood thinners at this time.    0950- dr Sidney Ace at bedside.

## 2020-07-30 NOTE — Progress Notes (Signed)
Report given to SDS.

## 2020-07-30 NOTE — Consult Note (Signed)
Referring Physician:  No referring provider defined for this encounter.  Primary Physician:  Baxter Hire, MD  Chief Complaint:  T9-10 fracture  History of Present Illness: 07/30/2020 Marbin Olshefski is a 75 y.o. male who presents with the chief complaint of fall while doing work at home.  He slipped on a ladder and had immediate pain.  He came to ER after his pain continued despite 2 days of waiting.  He is feeling somewhat better this morning with pain meds, but continues to have severe pain with certain movements.  He has no other neurologic complaints.   Review of Systems:  A 10 point review of systems is negative, except for the pertinent positives and negatives detailed in the HPI.  Past Medical History: Past Medical History:  Diagnosis Date   Hypertension     Past Surgical History: No past surgical history on file.  Allergies: Allergies as of 07/29/2020   (No Known Allergies)    Medications:  Current Facility-Administered Medications:    amLODipine (NORVASC) tablet 5 mg, 5 mg, Oral, Daily, Garba, Mohammad L, MD   atorvastatin (LIPITOR) tablet 40 mg, 40 mg, Oral, Daily, Renda Rolls, RPH, 40 mg at 07/30/20 0616   enoxaparin (LOVENOX) injection 40 mg, 40 mg, Subcutaneous, Q24H, Garba, Mohammad L, MD   HYDROmorphone (DILAUDID) injection 0.5-1 mg, 0.5-1 mg, Intravenous, Q2H PRN, Gala Romney L, MD, 1 mg at 07/29/20 2339   lactated ringers infusion, , Intravenous, Continuous, Dahal, Binaya, MD, Last Rate: 100 mL/hr at 07/30/20 0550, New Bag at 07/30/20 0550   losartan (COZAAR) tablet 50 mg, 50 mg, Oral, Daily, Renda Rolls, RPH, 50 mg at 07/30/20 0615   methocarbamol (ROBAXIN) 500 mg in dextrose 5 % 50 mL IVPB, 500 mg, Intravenous, Q6H PRN, Jonelle Sidle, Mohammad L, MD   nicotine (NICODERM CQ - dosed in mg/24 hours) patch 21 mg, 21 mg, Transdermal, Daily, Garba, Mohammad L, MD   ondansetron (ZOFRAN) tablet 4 mg, 4 mg, Oral, Q6H PRN **OR** ondansetron (ZOFRAN)  injection 4 mg, 4 mg, Intravenous, Q6H PRN, Jonelle Sidle, Mohammad L, MD, 4 mg at 07/30/20 0614  Current Outpatient Medications:    aspirin EC 81 MG EC tablet, Take 1 tablet (81 mg total) by mouth daily. Swallow whole., Disp: 30 tablet, Rfl: 11   atorvastatin (LIPITOR) 40 MG tablet, Take 1 tablet (40 mg total) by mouth daily., Disp: 30 tablet, Rfl: 0   hydrochlorothiazide (HYDRODIURIL) 12.5 MG tablet, Take 12.5 mg by mouth daily., Disp: , Rfl:    losartan (COZAAR) 50 MG tablet, Take 50 mg by mouth daily., Disp: , Rfl:    meloxicam (MOBIC) 15 MG tablet, Take 15 mg by mouth daily., Disp: , Rfl:    Social History: Social History   Tobacco Use   Smoking status: Some Days    Pack years: 0.00    Types: Cigarettes   Smokeless tobacco: Never  Substance Use Topics   Alcohol use: Never   Drug use: Never    Family Medical History: No family history on file.  Physical Examination: Vitals:   07/29/20 1434 07/30/20 0548  BP: 138/84 138/89  Pulse: 66 77  Resp: 20 18  Temp: 98 F (36.7 C)   SpO2: 97% 97%     General: Patient is well developed, well nourished, calm, collected, and in obvious distress with certain movements.  Psychiatric: Patient is non-anxious.  Head:  Pupils equal, round, and reactive to light.  ENT:  Oral mucosa appears well hydrated.  Neck:  Supple.  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  Respiratory: Patient is breathing without any difficulty.  Extremities: No edema.  Vascular: Palpable pulses in dorsal pedal vessels.  Skin:   On exposed skin, there are no abnormal skin lesions.  NEUROLOGICAL:  General: In no acute distress.   Awake, alert, oriented to person, place, and time.  Pupils equal round and reactive to light.  Facial tone is symmetric.  Tongue protrusion is midline.  There is no pronator drift.  ROM of spine: untested - fracture.  Strength: Side Biceps Triceps Deltoid Interossei Grip Wrist Ext. Wrist Flex.  R 5 5 5 5 5 5 5   L 5 5 5 5 5 5 5     Side Iliopsoas Quads Hamstring PF DF EHL  R 5 5 5 5 5 5   L 5 5 5 5 5 5     Bilateral upper and lower extremity sensation is intact to light touch. Reflexes are 1 and symmetric at the biceps, triceps, brachioradialis, patella and achilles. Hoffman's is absent.  Clonus is not present.  Toes are down-going.    Gait is untested.   Imaging: CT TL spine 07/29/20 IMPRESSION: CT THORACIC SPINE IMPRESSION   Features of diffuse idiopathic skeletal hyperostosis with a chalk stick fracture extending T9 vertebral body and into the T9-10 disc space with likely disruption of the posterior tension band with associated vertically oriented fracture through the T9 spinous process (AO Spine B2). Minimal adjacent soft tissue thickening. Should be considered an unstable fracture into   Additional multilevel degenerative changes without significant canal stenosis or foraminal narrowing.   CT LUMBAR SPINE IMPRESSION   No acute fracture or vertebral body height loss.   Multilevel discogenic and facet degenerative changes, detailed above.   Retrolisthesis L2 on L3 is favored to be on a degenerative basis with some resulting moderate canal stenosis.   Aortic Atherosclerosis (ICD10-I70.0).   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 Recommend follow-up ultrasound every 3 years. This recommendation follows ACR consensus guidelines: White Paper of the ACR Incidental Findings Committee II on Vascular Findings. J Am Coll Radiol 2013; 10:789-794.   These results were called by telephone at the time of interpretation on 07/29/2020 at 5:41 pm to provider [facility] , who verbally acknowledged these results.     Electronically Signed   By: Lovena Le M.D.   On: 07/29/2020 17:43  MRI T spine 07/29/20 IMPRESSION: 1. Oblique fracture of the T9 vertebral body extending from the anterior superior margin of the vertebral body to the inferior central endplate, nondisplaced.  There is also a fracture of the tip of the T9 spinous process separating the spinous process from the flowing osteophytes along the supraspinous ligament. Both of these fractures were visible at CT. No subluxation is identified. 2. There is also a subtle nondisplaced fracture of the spinous process of T8. 3. Diffuse idiopathic skeletal hyperostosis. 4. Thoracic spondylosis and degenerative disc disease along with prominence of the dorsal epidural adipose tissues contribute to moderate central narrowing of the thecal sac at T8-9 and mild central narrowing of the thecal sac at T7-8 and T11-12.     Electronically Signed   By: Van Clines M.D.   On: 07/29/2020 21:07    I have personally reviewed the images and agree with the above interpretation.  Labs: CBC Latest Ref Rng & Units 07/30/2020 07/29/2020 01/01/2020  WBC 4.0 - 10.5 K/uL 11.3(H) 9.0 7.7  Hemoglobin 13.0 - 17.0 g/dL 14.8 15.2 15.3  Hematocrit 39.0 - 52.0 % 44.1 45.8 46.2  Platelets 150 - 400 K/uL 224 224 256       Assessment and Plan: Mr. Punt is a pleasant 75 y.o. male with unstable T9-10 fracture with ALL disruption.  Due to instability of the thoracic spine from this fracture, I recommended surgical intervention.  We will plan for T7-11 or T7-12 PSF and ORIF of the fracture.  Due to current utilization of the operating room, the first available operating time is tomorrow.    I discussed the planned procedure at length with the patient, including the risks, benefits, alternatives, and indications. The risks discussed include but are not limited to bleeding, infection, need for reoperation, spinal fluid leak, stroke, vision loss, anesthetic complication, coma, paralysis, and even death. I also described in detail that improvement was not guaranteed.  The patient expressed understanding of these risks, and asked that we proceed with surgery. I described the surgery in layman's terms, and gave ample opportunity for  questions, which were answered to the best of my ability.  Recommend bedrest in a brace until surgery.  Jahni Paul K. Izora Ribas MD, [place] [facility] of Neuro[facility]

## 2020-07-30 NOTE — Progress Notes (Signed)
Patient has home medications and bedside and stated he was taking his own medications whether we like it or not because we weren't giving him what he needed. Advised patient that we do not allow patient's to take home medications unless it is something we do not have on formulary. Pharmacy tech requested to come to bedside to complete medication reconciliation.   Patient also continues to walk around in room and hallway and even proceeded to dance in the room when asked to sit down. Explained to patient that he was a high fall risk and needed to stay in bed or not get up without assistance. Patient stated he cannot sit still that he feels better standing and moving around. MD made aware.

## 2020-07-31 ENCOUNTER — Encounter: Payer: Self-pay | Admitting: Neurosurgery

## 2020-07-31 MED ORDER — CHLORHEXIDINE GLUCONATE CLOTH 2 % EX PADS
6.0000 | MEDICATED_PAD | Freq: Every day | CUTANEOUS | Status: DC
  Administered 2020-07-31 – 2020-08-01 (×2): 6 via TOPICAL

## 2020-07-31 NOTE — Progress Notes (Signed)
PROGRESS NOTE  Jacob Velasquez  DOB: 1945-10-06  PCP: Baxter Hire, MD GGY:[id number]  DOA: 07/29/2020  LOS: 2 days  Hospital Day: 3   Chief Complaint  Patient presents with   Fall   Back Pain   Brief narrative: Jacob Velasquez is a 75 y.o. male with PMH significant for HTN, HLD, smoking, previous TIA. Patient was brought to the ED after a traumatic fall from a ladder following which he had a significant low back pain and mid back pain and difficulty with any movement.   In the ED, patient was hemodynamically stable CT head, cervical spine and lumbar spine were unremarkable CT thoracic spine showed diffuse idiopathic skeletal hyperostosis with a chalk stick fracture extending T9 vertebral body into T9-10 disc space with likely disruption of the posterior tension band with associated vertically oriented fracture through the T9 spinous process. Neurosurgery Dr. Cari Caraway was consulted from the ED Admitted to hospitalist service 6/30, patient underwent ORIF of T9 fracture.  Subjective: Patient was seen and examined this morning.  Sitting up in bed.  Taking his breakfast.  Catheter drain at the back.  Pain tolerable per patient.    Assessment/Plan: Unstable T9 compression fracture -Presented with fall leading to back pain. -CT thoracic spine finding as above showing a stable T9 compression fracture.  Neurosurgery consultation was obtained. -6/30, patient underwent ORIF of T9 fracture, posterolateral arthrodesis of T7-T11 and posterior segmental instrumentation at T7-11 - pending PT/OT eval.  Pain controlled.  Essential hypertension -On lisinopril and HCTZ at home.  Currently on hold.  Blood pressure elevated 154/85 this morning.  Will resume home meds. -Continue to monitor blood pressure  HLD History of TIA - Lipitor to continue  Current everyday smoker -Counseled to quit. Nicotine patch offered.  Mobility: Minimal activities for now. PT/OT eval  postprocedure Code Status:   Code Status: Full Code  Nutritional status: Body mass index is 28.25 kg/m.     Diet:  Diet Order             Diet Heart Room service appropriate? Yes; Fluid consistency: Thin  Diet effective now                  DVT prophylaxis:  enoxaparin (LOVENOX) injection 40 mg Start: 07/29/20 2300   Antimicrobials: None Fluid: Can stop IV fluid today Consultants: Neurosurgery Family Communication: None at bedside  Status is: Inpatient  Remains inpatient appropriate because: Pending PT evaluation  Dispo: The patient is from: Home              Anticipated d/c is to: Home with home health most likely              Patient currently is not medically stable to d/c.   Difficult to place patient No     Infusions:   methocarbamol (ROBAXIN) IV      Scheduled Meds:  atorvastatin  40 mg Oral Daily   celecoxib  100 mg Oral BID   Chlorhexidine Gluconate Cloth  6 each Topical Daily   diltiazem  15 mg Oral Q6H WA   enoxaparin (LOVENOX) injection  40 mg Subcutaneous Q24H   hydrochlorothiazide  12.5 mg Oral Daily   losartan  50 mg Oral QHS   nicotine  21 mg Transdermal Daily    Antimicrobials: Anti-infectives (From admission, onward)    None       PRN meds: methocarbamol (ROBAXIN) IV, methocarbamol, ondansetron **OR** ondansetron (ZOFRAN) IV, oxyCODONE   Objective: Vitals:  07/31/20 0455 07/31/20 0823  BP: 121/75 (!) 154/85  Pulse: 69 84  Resp: 18 16  Temp: 97.8 F (36.6 C) 98 F (36.7 C)  SpO2: 97% 96%    Intake/Output Summary (Last 24 hours) at 07/31/2020 1036 Last data filed at 07/31/2020 1024 Gross per 24 hour  Intake 1573.33 ml  Output 825 ml  Net 748.33 ml   Filed Weights   07/29/20 1448 07/30/20 1624  Weight: 99.8 kg 99.8 kg   Weight change: 0.009 kg Body mass index is 28.25 kg/m.   Physical Exam: General exam: Pleasant elderly Caucasian male.  Sitting up in bed.  Not in distress.  Pain controlled  skin: No rashes,  lesions or ulcers. HEENT: Atraumatic, normocephalic, no obvious bleeding Lungs: Clear to auscultation bilaterally CVS: Regular rate and rhythm, no murmur GI/Abd soft, nontender, nondistended, bowel sound present CNS: Alert, awake, oriented x3 Psychiatry: Mood appropriate Extremities: No pedal edema, no calf tenderness Musculoskeletal/back: Dressing along with a drain in place.  Data Review: I have personally reviewed the laboratory data and studies available.  Recent Labs  Lab 07/29/20 2310 07/30/20 0601  WBC 9.0 11.3*  HGB 15.2 14.8  HCT 45.8 44.1  MCV 93.7 93.2  PLT 224 224   Recent Labs  Lab 07/29/20 2310 07/30/20 0601  NA  --  138  K  --  4.1  CL  --  102  CO2  --  30  GLUCOSE  --  179*  BUN  --  21  CREATININE 1.25* 1.18  CALCIUM  --  9.0  MG  --  2.2    F/u labs ordered Unresulted Labs (From admission, onward)     Start     Ordered   08/05/20 0500  Creatinine, serum  (enoxaparin (LOVENOX)    CrCl >/= 30 ml/min)  Weekly,   STAT     Comments: while on enoxaparin therapy    07/29/20 2245            Signed, Terrilee Croak, MD [facility] 07/31/2020

## 2020-07-31 NOTE — Evaluation (Signed)
Physical Therapy Evaluation Patient Details Name: Jacob Velasquez MRN: [id number] DOB: [birth year] Today's Date: 07/31/2020   History of Present Illness  Jacob Velasquez is a 59yoM who [facility] after fall from ladder. Pt hit his head during fall. CT of head and cervical spine are negative for acute process. PMH: THN, HLD, tobacco use, lumbar and cervical radiculopathy, CVA. T-spine CT showing T9 vertebral body and T9-T10 disc space vertically oriented fracture through the T9 spinous process. Pt seen by Dr. Izora Ribas on 6/30 for T9 ORIF, T7-11 arthrodesis.  Clinical Impression  Pt admitted with above diagnosis. Pt currently with functional limitations due to the deficits listed below (see "PT Problem List"). Upon entry, pt found seated at EOB near FOB (4 bed rails up), socks halfways off with feet resting on a tangled bed sheet. The patient is hollering out for assist, trying to access a grocery bag lying on the counter, bag contains only a box of toothpaste, pt perseverating on location some pants to don. Pt has no awareness of 2 drains coming out of spine surgical site, but tubes on tension, nor his IV. TLSO is not donned. Despite high impulsivity, perseveration, and limited safety awareness, Jacob Velasquez makes great efforts to slowly talk the patient through a safe, paced, and sequential resolution to this most unsafe situation. Pt is asked to cease impulsive mobility multiple times. Author reviewed, log rolling, back precautions, and TLSO instructions multiple times using teach-back method, largely unsuccessful- pt will need assistance with these at DC. Pt requires minA for bed mobility, minA for STS transfers, minGuardA for AMB in room which is terminated after pt begin clogging in front of the bed without use of RW. Pt left in bed, sitting tall, alarm armed, all needs met, except for pants which are not in the room. Unclear if pt has cognition impairment at baseline, will obtain from family at later  date/time. Patient's performance this date reveals decreased ability, independence, and tolerance in performing all basic mobility required for performance of activities of daily living. Pt requires additional DME, close physical assistance, and cues for safe participate in mobility. Pt will benefit from skilled PT intervention to increase independence and safety with basic mobility in preparation for discharge to the venue listed below.       Follow Up Recommendations Supervision for mobility/OOB;Follow surgeon's recommendation for DC plan and follow-up therapies;Supervision/Assistance - 24 hour    Equipment Recommendations  Rolling walker with 5" wheels    Recommendations for Other Services       Precautions / Restrictions Precautions Precautions: Fall;Back Precaution Booklet Issued: No Required Braces or Orthoses: Spinal Brace Spinal Brace: Thoracolumbosacral orthotic;Applied in sitting position Restrictions Weight Bearing Restrictions: No      Mobility  Bed Mobility Overal bed mobility: Needs Assistance Bed Mobility: Rolling;Sit to Sidelying Rolling: Min assist       Sit to sidelying: Min assist General bed mobility comments: teaching log roll return to supine, pt hesitant but agreeable, requires minA due to pain    Transfers Overall transfer level: Needs assistance Equipment used: Rolling walker (2 wheeled) Transfers: Sit to/from Stand Sit to Stand: Min assist         General transfer comment: can perform at supervision from elevated seat  Ambulation/Gait Ambulation/Gait assistance: Min guard Gait Distance (Feet): 120 Feet Assistive device: Rolling walker (2 wheeled) Gait Pattern/deviations: Staggering right;Drifts right/left;Staggering left     General Gait Details: gait mechanics lack finess, however no frank LOB occurs, pt requires close  assistance due to limited safety awareness and impulsivity.  Stairs            Wheelchair Mobility     Modified Rankin (Stroke Patients Only)       Balance Overall balance assessment: Modified Independent;History of Falls                                           Pertinent Vitals/Pain Pain Assessment: 0-10 Pain Score: 5  Pain Location: mid back, near surgical area Pain Descriptors / Indicators: Aching Pain Intervention(s): Limited activity within patient's tolerance;Monitored during session;Premedicated before session    Home Living Family/patient expects to be discharged to:: Private residence Living Arrangements: Children [name]) Available Help at Discharge: Family;Available 24 hours/day Type of Home: House Home Access: Stairs to enter   [employer] of Steps: 1 Home Layout: One level Home Equipment: None      Prior Function Level of Independence: Independent         Comments: Pt indep and active, no AD, enjoys hunting, no falls     Hand Dominance   Dominant Hand: Right    Extremity/Trunk Assessment   Upper Extremity Assessment Upper Extremity Assessment: Generalized weakness;Overall [facility] for tasks assessed    Lower Extremity Assessment Lower Extremity Assessment: Generalized weakness;Overall WFL for tasks assessed       Communication   Communication: No difficulties  Cognition Arousal/Alertness: Awake/alert Behavior During Therapy: Restless;Impulsive Overall Cognitive Status: No family/caregiver present to determine baseline cognitive functioning                                 General Comments: Very limited safety awareness, limited insight      General Comments      Exercises     Assessment/Plan    PT Assessment Patient needs continued PT services  PT Problem List Decreased cognition;Decreased knowledge of use of DME;Decreased safety awareness;Decreased knowledge of precautions;Decreased balance;Decreased mobility       PT Treatment Interventions DME instruction;Gait training;Stair  training;Functional mobility training;Therapeutic activities;Therapeutic exercise;Balance training;Cognitive remediation;Patient/family education    PT Goals (Current goals can be found in the Care Plan section)  Acute Rehab PT Goals Patient Stated Goal: wants to put on some pants PT Goal Formulation: With patient Time For Goal Achievement: 08/14/20 Potential to Achieve Goals: Good    Frequency 7X/week   Barriers to discharge        Co-evaluation               AM-PAC PT "6 Clicks" Mobility  Outcome Measure Help needed turning from your back to your side while in a flat bed without using bedrails?: A Lot Help needed moving from lying on your back to sitting on the side of a flat bed without using bedrails?: A Lot Help needed moving to and from a bed to a chair (including a wheelchair)?: A Lot Help needed standing up from a chair using your arms (e.g., wheelchair or bedside chair)?: A Lot Help needed to walk in hospital room?: A Little Help needed climbing 3-5 steps with a railing? : A Little 6 Click Score: 14    End of Session Equipment Utilized During Treatment: Back brace Activity Tolerance: Patient tolerated treatment well;Other (comment) (safety awaress, impulsivity.) Patient left: in bed;with call bell/phone within reach;with bed alarm set Nurse Communication: Mobility status;Other (comment) (  safety concerns) PT Visit Diagnosis: Unsteadiness on feet (R26.81);Other abnormalities of gait and mobility (R26.89);History of falling (Z91.81);Other symptoms and signs involving the nervous system (R29.898)    Time: 7493-5521 PT Time Calculation (min) (ACUTE ONLY): 29 min   Charges:   PT Evaluation $PT Eval Moderate Complexity: 1 Mod PT Treatments $Self Care/Home Management: 8-22       12:45 PM, 07/31/20 Etta Grandchild, PT, DPT Physical Therapist - [facility]  [phone] ([place])    Butts C 07/31/2020, 12:38 PM

## 2020-07-31 NOTE — Plan of Care (Signed)

## 2020-07-31 NOTE — Progress Notes (Signed)
    Attending Progress Note  History: Jacob Velasquez is here for unstable T9 fracture.  DOS: 07/30/20 - T7-11 PSF, ORIF T9 fracture  POD1: doing well.  Pain is under control.  No new issues.    Physical Exam: Vitals:   07/31/20 0004 07/31/20 0455  BP: (!) 152/96 121/75  Pulse: (!) 101 69  Resp: 18 18  Temp: 97.9 F (36.6 C) 97.8 F (36.6 C)  SpO2: 97% 97%    AA Ox3 CNI  Strength:5/5 throughout BUE and BLE Wound vac in place holding suction Drain 125  Data:  Recent Labs  Lab 07/30/20 0601  NA 138  K 4.1  CL 102  CO2 30  BUN 21  CREATININE 1.18  GLUCOSE 179*  CALCIUM 9.0   Recent Labs  Lab 07/30/20 0601  AST 27  ALT 24  ALKPHOS 70     Recent Labs  Lab 07/29/20 2310 07/30/20 0601  WBC 9.0 11.3*  HGB 15.2 14.8  HCT 45.8 44.1  PLT 224 224   No results for input(s): APTT, INR in the last 168 hours.       Other tests/results: films pending  Assessment/Plan:  Jacob Velasquez is doing well after T7-11 fracture for unstable T9 fracture.  - mobilize with brace - pain control - switch to oral meds - DVT prophylaxis - ok to start lovenox - PTOT - prefer no nicotine replacement - OK to remove foley catheter from my standpoint   Meade Maw MD, [facility] Department of Neurosurgery

## 2020-07-31 NOTE — Progress Notes (Signed)
Met with the patient at the bedside to discuss DC plan and needs He lives at home with his son He has transportation He can afford his medication He stated he does not want or need DME He does not want or need [place] services He has a list with exercises and restrictions he stated that he will follow, will continue to monitor for needs

## 2020-07-31 NOTE — Evaluation (Signed)
Occupational Therapy Evaluation Patient Details Name: Jacob Velasquez MRN: [id number] DOB: Nov 19, 1945 Today's Date: 07/31/2020    History of Present Illness Jacob Velasquez is a 52yoM who comes to [facility] on 6/29 after fall from ladder. Pt hit his head during fall. CT of head and cervical spine are negative for acute process. PMH: THN, HLD, tobacco use, lumbar and cervical radiculopathy, CVA. T-spine CT showing T9 vertebral body and T9-T10 disc space vertically oriented fracture through the T9 spinous process. Pt seen by Dr. Izora Velasquez on 6/30 for T9 ORIF, T7-11 arthrodesis.   Clinical Impression   Pt seen for OT evaluation this date, POD#1 from above spinal surgery. Prior to hospital admission, pt was independent with mobility, ADL, and IADL. Pt reports he fell stepping of a piece of scaffolding onto "sycam[place] balls." Pt lives with his adult son who "is there most of the time." [facility], no AD/DME. Pt endorsing 5/10 back pain, restless and attempting to reposition in upright position throughout session. RN notified per pt's request. Declines OOB and AE trial 2/2 pain. Pt educated in back precautions and how to maintain during ADL and ADL mobility, AE/DME, home/routines modifications, and falls prevention strategies to maximize safety and functional independence while minimizing falls risk and maintaining precautions.; handout provided. Pt took handout, read it over, and verbalized understanding. Pt required PRN cues to redirect or revisit education to allow for teach back opportunities for TLSO management and implementing back precautions into ADL and mobility tasks. Will further assess functional independence next session with better pain mgt. Recommend additional skilled OT while hospitalized and follow up per surgeon upon discharge.    Follow Up Recommendations  Supervision/Assistance - 24 hour;Follow surgeon's recommendation for DC plan and follow-up therapies    Equipment Recommendations  3 in 1 bedside  commode    Recommendations for Other Services       Precautions / Restrictions Precautions Precautions: Fall;Back Precaution Booklet Issued: Yes (comment) Precaution Comments: no bending, lifting, twisting, arching Required Braces or Orthoses: Spinal Brace Spinal Brace: Thoracolumbosacral orthotic;Applied in sitting position Restrictions Weight Bearing Restrictions: No      Mobility Bed Mobility Overal bed mobility: Needs Assistance Bed Mobility: Rolling;Sit to Sidelying Rolling: Min assist       Sit to sidelying: Min assist General bed mobility comments: pt declined 2/2 back pain    Transfers Overall transfer level: Needs assistance Equipment used: Rolling walker (2 wheeled) Transfers: Sit to/from Stand Sit to Stand: Min assist         General transfer comment: pt declined 2/2 back pain    Balance Overall balance assessment: History of Falls                                         ADL either performed or assessed with clinical judgement   ADL Overall ADL's : Needs assistance/impaired                                       General ADL Comments: Pt declines to trial 2/2 back pain, without AE, requires MIN A for LB dressing and bathing, will attempt to trial AE next session     Vision Baseline Vision/History: Wears glasses Wears Glasses: Reading only Patient Visual Report: No change from baseline       Perception  Praxis      Pertinent Vitals/Pain Pain Assessment: 0-10 Pain Score: 5  Pain Location: mid back, near surgical area Pain Descriptors / Indicators: Aching;Restless Pain Intervention(s): Limited activity within patient's tolerance;Monitored during session;Repositioned;Patient requesting pain meds-RN notified     Hand Dominance Right   Extremity/Trunk Assessment Upper Extremity Assessment Upper Extremity Assessment: Overall WFL for tasks assessed;Generalized weakness   Lower Extremity Assessment Lower  Extremity Assessment: Overall WFL for tasks assessed;Generalized weakness   Cervical / Trunk Assessment Cervical / Trunk Assessment: Other exceptions Cervical / Trunk Exceptions: s/p T9 fracture surgical repair   Communication Communication Communication: No difficulties   Cognition Arousal/Alertness: Awake/alert Behavior During Therapy: Restless;Impulsive Overall Cognitive Status: No family/caregiver present to determine baseline cognitive functioning                                 General Comments: alert and oriented, very limited safety awareness, limited insight, a bit dismissive of instruction and directive in how he performs tasks   General Comments       Exercises Other Exercises Other Exercises: Pt educated in back precautions and how to maintain during ADL adn ADL mobility; handout provided. Pt read over handout, verbalized understanding, requiring PRN cues to redirect or revisit education to allow for teach back strategies   Shoulder Instructions      Home Living Family/patient expects to be discharged to:: Private residence Living Arrangements: Children Jacob Velasquez) Available Help at Discharge: Family;Available 24 hours/day Type of Home: House Home Access: Stairs to enter [employer] of Steps: 1   Home Layout: One level     Bathroom Shower/Tub: Tub/shower unit;Walk-in shower   Bathroom Toilet: Standard     Home Equipment: None          Prior Functioning/Environment Level of Independence: Independent        Comments: Pt indep and active, no AD, enjoys hunting, no additional falls        OT Problem List: Decreased strength;Decreased safety awareness;Decreased knowledge of use of DME or AE;Decreased knowledge of precautions;Pain      OT Treatment/Interventions: Self-care/ADL training;Therapeutic exercise;Therapeutic activities;DME and/or AE instruction;Patient/family education;Balance training    OT Goals(Current goals can be  found in the care plan section) Acute Rehab OT Goals Patient Stated Goal: go home and recover OT Goal Formulation: With patient Time For Goal Achievement: 08/14/20 Potential to Achieve Goals: Good ADL Goals Pt Will Perform Lower Body Dressing: with set-up;with supervision;with adaptive equipment;sit to/from stand Pt Will Transfer to Toilet: with supervision;ambulating (elevated commode, LRAD for amb, maintaining back precautions) Additional ADL Goal #1: Pt will independently instruct family in TLSO application and mgt Additional ADL Goal #2: Pt will independently verbalize 3/3 spinal precautions and how to maintain during bed mobility, bathing, and dressing to maximize safety.  OT Frequency: Min 2X/week   Barriers to D/C:            Co-evaluation              AM-PAC OT "6 Clicks" Daily Activity     Outcome Measure Help from another person eating meals?: None Help from another person taking care of personal grooming?: None Help from another person toileting, which includes using toliet, bedpan, or urinal?: A Little Help from another person bathing (including washing, rinsing, drying)?: A Little Help from another person to put on and taking off regular upper body clothing?: None Help from another person to put on and taking off  regular lower body clothing?: A Little 6 Click Score: 21   End of Session Nurse Communication: Patient requests pain meds  Activity Tolerance: Patient limited by pain Patient left: in bed;with call bell/phone within reach;with bed alarm set  OT Visit Diagnosis: Other abnormalities of gait and mobility (R26.89);History of falling (Z91.81);Muscle weakness (generalized) (M62.81);Pain Pain - part of body:  (back)                Time: 2505-3976 OT Time Calculation (min): 34 min Charges:  OT General Charges $OT Visit: 1 Visit OT Evaluation $OT Eval Moderate Complexity: 1 Mod OT Treatments $Self Care/Home Management : 23-37 mins  Hanley Hays, MPH, MS,  OTR/L ascom [phone] 07/31/20, 2:21 PM

## 2020-07-31 NOTE — Care Management Important Message (Signed)
Important Message  Patient Details  Name: Jacob Velasquez MRN: [id number] Date of Birth: [birth year]   Medicare Important Message Given:  N/A - LOS <3 / Initial given by admissions     Dannette Barbara 07/31/2020, 9:46 AM

## 2020-08-01 MED ORDER — OXYCODONE HCL 5 MG PO TABS: 5.0000 mg | ORAL_TABLET | Freq: Four times a day (QID) | ORAL | 0 refills | Status: DC | PRN

## 2020-08-01 MED ORDER — CELECOXIB 100 MG PO CAPS: 100.0000 mg | ORAL_CAPSULE | Freq: Two times a day (BID) | ORAL | 0 refills | Status: AC

## 2020-08-01 MED ORDER — ASPIRIN 81 MG PO TBEC: 81.0000 mg | DELAYED_RELEASE_TABLET | Freq: Every day | ORAL | 11 refills | Status: DC

## 2020-08-01 MED ORDER — METHOCARBAMOL 500 MG PO TABS: 500.0000 mg | ORAL_TABLET | Freq: Three times a day (TID) | ORAL | 0 refills | Status: AC | PRN

## 2020-08-01 NOTE — Progress Notes (Addendum)
PT Cancellation Note  Patient Details Name: Jacob Velasquez MRN: [id number] DOB: 1945/10/30   Cancelled Treatment:     Pt was observed independently ambulating in hallway. Discussed DC and PT going forward. He did not feel need for Acute PT session prior to DC. Did review expectations at DC and need for continued use of RW and back brace for safety. He states understanding however will need reinforcement.    Willette Pa 08/01/2020, 11:17 AM

## 2020-08-01 NOTE — Discharge Summary (Signed)
Physician Discharge Summary  Jacob Velasquez IRW:[id number] DOB: January 19, 1946 DOA: 07/29/2020  PCP: Baxter Hire, MD  Admit date: 07/29/2020 Discharge date: 08/01/2020  Admitted From: Home Discharge disposition: Home.  Refuses home health or DME   Code Status: Full Code  Diet Recommendation: Cardiac diet  Discharge Diagnosis:  Principal Problem:   Closed wedge compression fracture of T9 vertebra ([place]) Active Problems:   Paresthesia   Essential hypertension   Tobacco use     Chief Complaint  Patient presents with   Fall   Back Pain   Brief narrative: Jacob Velasquez is a 75 y.o. male with PMH significant for HTN, HLD, smoking, previous TIA. Patient was brought to the ED after a traumatic fall from a ladder following which he had a significant low back pain and mid back pain and difficulty with any movement.   In the ED, patient was hemodynamically stable CT head, cervical spine and lumbar spine were unremarkable CT thoracic spine showed diffuse idiopathic skeletal hyperostosis with a chalk stick fracture extending T9 vertebral body into T9-10 disc space with likely disruption of the posterior tension band with associated vertically oriented fracture through the T9 spinous process. Neurosurgery Dr. Cari Caraway was consulted from the ED Admitted to hospitalist service 6/30, patient underwent ORIF of T9 fracture.  Subjective: Patient was seen and examined this morning.  Propped up in bed.  Not in distress.  Pain controlled. Seen by neurosurgery this morning.  Drain pulled out.  Stable to go home today.  Hospital course: Unstable T9 compression fracture -Presented with fall leading to back pain. -CT thoracic spine finding as above showing a stable T9 compression fracture.  Neurosurgery consultation was obtained. -6/30, patient underwent ORIF of T9 fracture, posterolateral arthrodesis of T7-T11 and posterior segmental instrumentation at T7-11 -Drain pulled out by  neurosurgery today.  Pain controlled. -PT eval obtained.  Home health and DME recommended but patient refuses.  Wants to go home to live with son.  Essential hypertension -On lisinopril and HCTZ at home. Controlled on home medicines.  Continue the same.    Paroxysmal A. fib with mild RVR -In the immediate postop period, patient was noted to be in new onset A. Fib with heart rate in 90s. -It was short-lived and improved with Cardizem..  Patient has no history of A. fib and I believe his A. fib was related to the stress of surgery. -Patient has remained in normal sinus rhythm since then.  I would resume aspirin after postop day 7. -To follow-up with PCP and cardiology as an outpatient for further cardiac testing.  HLD History of TIA -Lipitor to continue -To resume aspirin after postop day 7.  Current everyday smoker -Counseled to quit. Nicotine patch offered.   Wound care: Negative Pressure Wound Therapy Vertebral column (Active)  Placement Date/Time: 07/30/20 2005   Wound Type: (c) Incision (Closed wound)  Location: Vertebral column    Assessments 07/30/2020  9:00 PM 07/30/2020 10:02 PM  Cycle Continuous Continuous  Target Pressure (mmHg) 125 125  Drainage Amount None None     No Linked orders to display     Incision (Closed) 07/30/20 Back Other (Comment) (Active)  Date First Assessed/Time First Assessed: 07/30/20 2009   Location: Back  Location Orientation: Other (Comment)    Assessments 07/30/2020  9:00 PM 07/31/2020 10:30 PM  Dressing Clean;Dry;Intact Clean;Dry;Intact  Site / Wound Assessment Dressing in place / Unable to assess Dressing in place / Unable to assess  Drainage Amount None None  No Linked orders to display    Discharge Exam:   Vitals:   07/31/20 1117 07/31/20 2017 08/01/20 0558 08/01/20 0803  BP: 123/76 131/78 132/75 125/80  Pulse: 83 82 80 60  Resp: 16 18 18 20   Temp: 97.9 F (36.6 C) 97.9 F (36.6 C) 98.2 F (36.8 C) 98.7 F (37.1 C)  TempSrc:  Oral Oral Oral   SpO2: 95% 95% 97% 96%  Weight:      Height:        Body mass index is 28.25 kg/m.  General exam: Pleasant elderly Caucasian male.  Not in distress.  Pain controlled Skin: No rashes, lesions or ulcers. HEENT: Atraumatic, normocephalic, no obvious bleeding Lungs: Clear to auscultation bilaterally CVS: Regular rate and rhythm, no murmur GI/Abd soft, nontender, nondistended, bowel sound present CNS: Alert, awake, oriented x3 Psychiatry: Mood appropriate Extremities: No pedal edema, no calf tenderness  Follow ups:   Discharge Instructions     Increase activity slowly   Complete by: As directed    Leave dressing on - Keep it clean, dry, and intact until clinic visit   Complete by: As directed        Follow-up Information     Baxter Hire, MD Follow up.   Specialty: Internal Medicine Contact information: [address] [place] [id number] [phone]                 Recommendations for Outpatient Follow-Up:   Follow-up with PCP as an outpatient Follow-up with neurosurgery as an outpatient  Discharge Instructions:  Follow with Primary MD Baxter Hire, MD in 7 days   Get CBC/BMP checked in next visit within 1 week by PCP or SNF MD ( we routinely change or add medications that can affect your baseline labs and fluid status, therefore we recommend that you get the mentioned basic workup next visit with your PCP, your PCP may decide not to get them or add new tests based on their clinical decision)  On your next visit with your PCP, please Get Medicines reviewed and adjusted.  Please request your PCP  to go over all Hospital Tests and Procedure/Radiological results at the follow up, please get all Hospital records sent to your Prim MD by signing hospital release before you go home.  Activity: As tolerated with Full fall precautions use walker/cane & assistance as needed  For Heart failure patients - Check your Weight same time  everyday, if you gain over 2 pounds, or you develop in leg swelling, experience more shortness of breath or chest pain, call your Primary MD immediately. Follow Cardiac Low Salt Diet and 1.5 lit/day fluid restriction.  If you have smoked or chewed Tobacco in the last 2 yrs please stop smoking, stop any regular Alcohol  and or any Recreational drug use.  If you experience worsening of your admission symptoms, develop shortness of breath, life threatening emergency, suicidal or homicidal thoughts you must seek medical attention immediately by calling 911 or calling your MD immediately  if symptoms less severe.  You Must read complete instructions/literature along with all the possible adverse reactions/side effects for all the Medicines you take and that have been prescribed to you. Take any new Medicines after you have completely understood and accpet all the possible adverse reactions/side effects.   Do not drive, operate heavy machinery, perform activities at heights, swimming or participation in water activities or provide baby sitting services if your were admitted for syncope or siezures until you have seen  by Primary MD or a Neurologist and advised to do so again.  Do not drive when taking Pain medications.  Do not take more than prescribed Pain, Sleep and Anxiety Medications  Wear Seat belts while driving.   Please note You were cared for by a hospitalist during your hospital stay. If you have any questions about your discharge medications or the care you received while you were in the hospital after you are discharged, you can call the unit and asked to speak with the hospitalist on call if the hospitalist that took care of you is not available. Once you are discharged, your primary care physician will handle any further medical issues. Please note that NO REFILLS for any discharge medications will be authorized once you are discharged, as it is imperative that you return to your primary care  physician (or establish a relationship with a primary care physician if you do not have one) for your aftercare needs so that they can reassess your need for medications and monitor your lab values.    Time coordinating discharge: 35 minutes  Allergies as of 08/01/2020   No Known Allergies      Medication List     STOP taking these medications    meloxicam 15 MG tablet Commonly known as: MOBIC       TAKE these medications    aspirin 81 MG EC tablet Take 1 tablet (81 mg total) by mouth daily. Swallow whole. Start taking on: August 07, 2020 What changed: These instructions start on August 07, 2020. If you are unsure what to do until then, ask your doctor or other care provider.   atorvastatin 40 MG tablet Commonly known as: Lipitor Take 1 tablet (40 mg total) by mouth daily.   celecoxib 100 MG capsule Commonly known as: CELEBREX Take 1 capsule (100 mg total) by mouth 2 (two) times daily.   hydrochlorothiazide 12.5 MG tablet Commonly known as: HYDRODIURIL Take 12.5 mg by mouth daily.   losartan 50 MG tablet Commonly known as: COZAAR Take 50 mg by mouth daily.   methocarbamol 500 MG tablet Commonly known as: ROBAXIN Take 1 tablet (500 mg total) by mouth every 8 (eight) hours as needed for up to 5 days for muscle spasms.   oxyCODONE 5 MG immediate release tablet Commonly known as: Oxy IR/ROXICODONE Take 1-2 tablets (5-10 mg total) by mouth every 6 (six) hours as needed for up to 5 days for moderate pain.               Discharge Care Instructions  (From admission, onward)           Start     Ordered   08/01/20 0000  Leave dressing on - Keep it clean, dry, and intact until clinic visit        08/01/20 1059            The results of significant diagnostics from this hospitalization (including imaging, microbiology, ancillary and laboratory) are listed below for reference.    Procedures and Diagnostic Studies:   DG Thoracic Spine 2 View  Result Date:  07/30/2020 CLINICAL DATA:  Fracture EXAM: DG C-ARM 1-60 MIN; THORACIC SPINE 2 VIEWS CONTRAST:  None FLUOROSCOPY TIME:  Fluoroscopy Time:  14 seconds Number of Acquired Spot Images: 2 COMPARISON:  07/29/2020 FINDINGS: Two low resolution intraoperative spot views of the thoracic spine. The images demonstrate posterior spinal rods and transpedicular screws at the lower thoracic spine. Slightly limited landmarks on low resolution images. IMPRESSION:  Intraoperative fluoroscopic assistance provided during thoracic spine surgery Electronically Signed   By: Donavan Foil M.D.   On: 07/30/2020 21:16   CT HEAD WO CONTRAST  Result Date: 07/30/2020 CLINICAL DATA:  Transient ischemic attack EXAM: CT HEAD WITHOUT CONTRAST TECHNIQUE: Contiguous axial images were obtained from the base of the skull through the vertex without intravenous contrast. COMPARISON:  None. FINDINGS: Brain: There is no mass, hemorrhage or extra-axial collection. The size and configuration of the ventricles and extra-axial CSF spaces are normal. There is hypoattenuation of the white matter, most commonly indicating chronic small vessel disease. Vascular: No abnormal hyperdensity of the major intracranial arteries or dural venous sinuses. No intracranial atherosclerosis. Skull: The visualized skull base, calvarium and extracranial soft tissues are normal. Sinuses/Orbits: No fluid levels or advanced mucosal thickening of the visualized paranasal sinuses. No mastoid or middle ear effusion. The orbits are normal. IMPRESSION: Chronic small vessel disease without acute intracranial abnormality. Electronically Signed   By: Ulyses Jarred M.D.   On: 07/30/2020 21:41   CT Head Wo Contrast  Result Date: 07/29/2020 CLINICAL DATA:  75 year old male with neck trauma. EXAM: CT HEAD WITHOUT CONTRAST CT CERVICAL SPINE WITHOUT CONTRAST TECHNIQUE: Multidetector CT imaging of the head and cervical spine was performed following the standard protocol without intravenous  contrast. Multiplanar CT image reconstructions of the cervical spine were also generated. COMPARISON:  Head CT dated 01/01/2020. FINDINGS: CT HEAD FINDINGS Brain: The ventricles and sulci appropriate size for patient's age. Mild periventricular and deep white matter chronic microvascular ischemic changes noted. There is no acute intracranial hemorrhage. No mass effect or midline shift no extra-axial fluid collection. Vascular: No hyperdense vessel or unexpected calcification. Skull: Normal. Negative for fracture or focal lesion. Sinuses/Orbits: No acute finding. Other: None CT CERVICAL SPINE FINDINGS Alignment: No acute subluxation. Skull base and vertebrae: No acute fracture. 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 Soft tissues and spinal canal: No prevertebral fluid or swelling. No visible canal hematoma. Disc levels: Multilevel degenerative changes with anterior osteophyte. Upper chest: Biapical subpleural scarring. Other: Mild right carotid bulb calcified plaques. IMPRESSION: 1. No acute intracranial pathology. Mild chronic microvascular ischemic changes. 2. No acute/traumatic cervical spine pathology. Electronically Signed   By: Anner Crete M.D.   On: 07/29/2020 17:23   CT Cervical Spine Wo Contrast  Result Date: 07/29/2020 CLINICAL DATA:  75 year old male with neck trauma. EXAM: CT HEAD WITHOUT CONTRAST CT CERVICAL SPINE WITHOUT CONTRAST TECHNIQUE: Multidetector CT imaging of the head and cervical spine was performed following the standard protocol without intravenous contrast. Multiplanar CT image reconstructions of the cervical spine were also generated. COMPARISON:  Head CT dated 01/01/2020. FINDINGS: CT HEAD FINDINGS Brain: The ventricles and sulci appropriate size for patient's age. Mild periventricular and deep white matter chronic microvascular ischemic changes noted. There is no acute intracranial hemorrhage. No mass effect or midline shift no extra-axial fluid collection.  Vascular: No hyperdense vessel or unexpected calcification. Skull: Normal. Negative for fracture or focal lesion. Sinuses/Orbits: No acute finding. Other: None CT CERVICAL SPINE FINDINGS Alignment: No acute subluxation. Skull base and vertebrae: No acute fracture. 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 Soft tissues and spinal canal: No prevertebral fluid or swelling. No visible canal hematoma. Disc levels: Multilevel degenerative changes with anterior osteophyte. Upper chest: Biapical subpleural scarring. Other: Mild right carotid bulb calcified plaques. IMPRESSION: 1. No acute intracranial pathology. Mild chronic microvascular ischemic changes. 2. No acute/traumatic cervical spine pathology. Electronically Signed   By: Anner Crete M.D.   On:  07/29/2020 17:23   CT Thoracic Spine Wo Contrast  Result Date: 07/29/2020 CLINICAL DATA:  Falling after stepping off bottom rung of ladder. Right back pain, struck back on ground. Yet key at the EXAM: CT THORACIC AND LUMBAR SPINE WITHOUT CONTRAST TECHNIQUE: Multidetector CT imaging of the thoracic and lumbar spine was performed without contrast. Multiplanar CT image reconstructions were also generated. COMPARISON:  Two-view chest radiograph 07/20/2019 FINDINGS: CT THORACIC SPINE FINDINGS Alignment: Preservation of the normal thoracic kyphosis. No perched or jumped facets. Minimal widening across a fracture line extending through the T9 vertebral body extending into the disc space and through the posterior elements and into the supraspinous ligament. Vertebrae: Multilevel flowing anterior osteophytosis, compatible with features of diffuse idiopathic skeletal hyperostosis (DISH). Partial fusion across multiple spinous processes as well. There is a chalk stick type fracture and extending through the anterior cortex of the T9 vertebrae into the inferior endplate and extending through the T9-T10 disc space with presumed disruption of the posterior tension band  given a vertical fracture through the T9 spinous process as well. No clear extension into the T10 vertebral level. No other acute fracture or traumatic osseous injury. The osseous structures appear diffusely demineralized which may limit detection of small or nondisplaced fractures. Paraspinal and other soft tissues: Minimal soft tissue thickening adjacent the T9 fracture. No other visible paraspinal fluid, swelling, gas or hemorrhage. No visible canal hematoma. Included portions the chest and abdomen reveal aortic atherosclerosis and some dependent atelectatic changes without other acute abnormality. Disc levels: Mild multilevel discogenic changes including evidence of desiccation most pronounced T5 to T9 as well as at T10 and 11. Multilevel Schmorl's node formations are noted as well. Minimal posterior spurring of the endplates T11-12. No significant central canal or foraminal stenosis identified within the imaged levels of the thoracic spine. CT LUMBAR SPINE FINDINGS Segmentation: 5 normally formed lumbar levels. Alignment: 3 mm retrolisthesis L2 on L3 favored to be on a facet degenerative basis. No abnormally widened, jumped or perched facets. Vertebrae: No acute vertebral body fracture or height loss. Discogenic and facet degenerative changes as below. The osseous structures appear diffusely demineralized which may limit detection of small or nondisplaced fractures. No suspicious osseous lesions. Paraspinal and other soft tissues: No paraspinal fluid, swelling, gas or hemorrhage. No visible canal hematoma. Included portion of the abdomen and pelvis reveal aortoiliac atherosclerosis. Mild fusiform dilatation of the infrarenal abdominal aorta to 3.2 cm. No acute abnormality posterior abdomen or pelvis. Mild posterior body wall edema. Disc levels: Level by level evaluation of the lumbar spine below: T12-L1: Anterior bridging osteophyte. No significant posterior disc abnormality. No significant spinal canal or  foraminal stenosis. L1-L2: Anterior bridging osteophyte. Disc desiccation. No significant posterior disc abnormality. Mild bilateral facet arthropathy. No significant spinal canal or foraminal stenosis. L2-L3: Retrolisthesis, disc height loss and vacuum disc formation with exuberant though non bridging anterior osteophyte formation. Mild bilateral facet arthropathy. Resulting moderate canal stenosis and moderate bilateral foraminal narrowing with effacement of the lateral recesses. L3-L4: Disc height loss, desiccation and vacuum disc phenomenon with mild bilateral facet arthropathy. Moderate canal stenosis and bilateral foraminal narrowing. L4-L5: Disc height loss, desiccation with anterior spurring. Moderate bilateral facet arthropathy. Mild to moderate canal stenosis with moderate right and severe left foraminal narrowing and effacement of the lateral recesses. L5-S1: Disc height loss, desiccation and anterior spurring. Moderate bilateral facet arthropathy and ligamentum flavum infolding. Mild canal stenosis and severe right moderate left foraminal narrowing with effacement of the lateral recesses. IMPRESSION: CT  THORACIC SPINE IMPRESSION Features of diffuse idiopathic skeletal hyperostosis with a chalk stick fracture extending T9 vertebral body and into the T9-10 disc space with likely disruption of the posterior tension band with associated vertically oriented fracture through the T9 spinous process (AO Spine B2). Minimal adjacent soft tissue thickening. Should be considered an unstable fracture into Additional multilevel degenerative changes without significant canal stenosis or foraminal narrowing. CT LUMBAR SPINE IMPRESSION No acute fracture or vertebral body height loss. Multilevel discogenic and facet degenerative changes, detailed above. Retrolisthesis L2 on L3 is favored to be on a degenerative basis with some resulting moderate canal stenosis. Aortic Atherosclerosis (ICD10-I70.0). Fusiform dilatation of  the infrarenal abdominal aorta to 3.2 cm. Recommend follow-up ultrasound every 3 years. This recommendation follows ACR consensus guidelines: White Paper of the ACR Incidental Findings Committee II on Vascular Findings. J Am Coll Radiol 2013; 10:789-794. These results were called by telephone at the time of interpretation on 07/29/2020 at 5:41 pm to provider [facility] , who verbally acknowledged these results. Electronically Signed   By: Lovena Le M.D.   On: 07/29/2020 17:43   CT Lumbar Spine Wo Contrast  Result Date: 07/29/2020 CLINICAL DATA:  Falling after stepping off bottom rung of ladder. Right back pain, struck back on ground. Yet key at the EXAM: CT THORACIC AND LUMBAR SPINE WITHOUT CONTRAST TECHNIQUE: Multidetector CT imaging of the thoracic and lumbar spine was performed without contrast. Multiplanar CT image reconstructions were also generated. COMPARISON:  Two-view chest radiograph 07/20/2019 FINDINGS: CT THORACIC SPINE FINDINGS Alignment: Preservation of the normal thoracic kyphosis. No perched or jumped facets. Minimal widening across a fracture line extending through the T9 vertebral body extending into the disc space and through the posterior elements and into the supraspinous ligament. Vertebrae: Multilevel flowing anterior osteophytosis, compatible with features of diffuse idiopathic skeletal hyperostosis (DISH). Partial fusion across multiple spinous processes as well. There is a chalk stick type fracture and extending through the anterior cortex of the T9 vertebrae into the inferior endplate and extending through the T9-T10 disc space with presumed disruption of the posterior tension band given a vertical fracture through the T9 spinous process as well. No clear extension into the T10 vertebral level. No other acute fracture or traumatic osseous injury. The osseous structures appear diffusely demineralized which may limit detection of small or nondisplaced fractures. Paraspinal and other  soft tissues: Minimal soft tissue thickening adjacent the T9 fracture. No other visible paraspinal fluid, swelling, gas or hemorrhage. No visible canal hematoma. Included portions the chest and abdomen reveal aortic atherosclerosis and some dependent atelectatic changes without other acute abnormality. Disc levels: Mild multilevel discogenic changes including evidence of desiccation most pronounced T5 to T9 as well as at T10 and 11. Multilevel Schmorl's node formations are noted as well. Minimal posterior spurring of the endplates T11-12. No significant central canal or foraminal stenosis identified within the imaged levels of the thoracic spine. CT LUMBAR SPINE FINDINGS Segmentation: 5 normally formed lumbar levels. Alignment: 3 mm retrolisthesis L2 on L3 favored to be on a facet degenerative basis. No abnormally widened, jumped or perched facets. Vertebrae: No acute vertebral body fracture or height loss. Discogenic and facet degenerative changes as below. The osseous structures appear diffusely demineralized which may limit detection of small or nondisplaced fractures. No suspicious osseous lesions. Paraspinal and other soft tissues: No paraspinal fluid, swelling, gas or hemorrhage. No visible canal hematoma. Included portion of the abdomen and pelvis reveal aortoiliac atherosclerosis. Mild fusiform dilatation of the infrarenal abdominal  aorta to 3.2 cm. No acute abnormality posterior abdomen or pelvis. Mild posterior body wall edema. Disc levels: Level by level evaluation of the lumbar spine below: T12-L1: Anterior bridging osteophyte. No significant posterior disc abnormality. No significant spinal canal or foraminal stenosis. L1-L2: Anterior bridging osteophyte. Disc desiccation. No significant posterior disc abnormality. Mild bilateral facet arthropathy. No significant spinal canal or foraminal stenosis. L2-L3: Retrolisthesis, disc height loss and vacuum disc formation with exuberant though non bridging  anterior osteophyte formation. Mild bilateral facet arthropathy. Resulting moderate canal stenosis and moderate bilateral foraminal narrowing with effacement of the lateral recesses. L3-L4: Disc height loss, desiccation and vacuum disc phenomenon with mild bilateral facet arthropathy. Moderate canal stenosis and bilateral foraminal narrowing. L4-L5: Disc height loss, desiccation with anterior spurring. Moderate bilateral facet arthropathy. Mild to moderate canal stenosis with moderate right and severe left foraminal narrowing and effacement of the lateral recesses. L5-S1: Disc height loss, desiccation and anterior spurring. Moderate bilateral facet arthropathy and ligamentum flavum infolding. Mild canal stenosis and severe right moderate left foraminal narrowing with effacement of the lateral recesses. IMPRESSION: CT THORACIC SPINE IMPRESSION Features of diffuse idiopathic skeletal hyperostosis with a chalk stick fracture extending T9 vertebral body and into the T9-10 disc space with likely disruption of the posterior tension band with associated vertically oriented fracture through the T9 spinous process (AO Spine B2). Minimal adjacent soft tissue thickening. Should be considered an unstable fracture into Additional multilevel degenerative changes without significant canal stenosis or foraminal narrowing. CT LUMBAR SPINE IMPRESSION No acute fracture or vertebral body height loss. Multilevel discogenic and facet degenerative changes, detailed above. Retrolisthesis L2 on L3 is favored to be on a degenerative basis with some resulting moderate canal stenosis. Aortic Atherosclerosis (ICD10-I70.0). 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 Recommend follow-up ultrasound every 3 years. This recommendation follows ACR consensus guidelines: White Paper of the ACR Incidental Findings Committee II on Vascular Findings. J Am Coll Radiol 2013; 10:789-794. These results were called by telephone at the  time of interpretation on 07/29/2020 at 5:41 pm to provider [facility] , who verbally acknowledged these results. Electronically Signed   By: Lovena Le M.D.   On: 07/29/2020 17:43   MR THORACIC SPINE WO CONTRAST  Result Date: 07/29/2020 CLINICAL DATA:  T9 fracture.  Fall.  Right back pain. EXAM: MRI THORACIC SPINE WITHOUT CONTRAST TECHNIQUE: Multiplanar, multisequence MR imaging of the thoracic spine was performed. No intravenous contrast was administered. COMPARISON:  CT thoracic spine 07/29/2020 FINDINGS: Alignment:  No vertebral subluxation is observed. Vertebrae: Oblique fracture extends from the anterior superior T9 vertebral body to the central inferior endplate as shown for example on image 10 series 19. There is also fracture of of the spinous process of T9 near the tip of the spinous process which is partially fused along the supraspinous ligament. There is also a subtle linear T2 signal hyperintensity traversing the T8 spinous process compatible with fracture or stress fracture on image 9 series 19, this appears nondisplaced. We do not show a significant amount of interspinous edema or splaying of the spinous processes. No other thoracic spine fractures are observed. Flowing osteophytes compatible with diffuse idiopathic skeletal hyperostosis. Cord:  No significant abnormal spinal cord signal is observed. Paraspinal and other soft tissues: Unremarkable Disc levels: Note is made of prominence of the dorsal epidural adipose tissues in the thoracic spine. T1-2: Unremarkable. T2-3: Unremarkable. T3-4: Unremarkable. T4-5: Unremarkable. T5-6: No impingement.  Loss of intervertebral disc height. T6-7: No impingement.  Loss of disc height. T7-8: Mild left eccentric central narrowing of the thecal sac due to left paracentral disc protrusion. T8-9: Moderate central narrowing of the thecal sac due to central disc protrusion. T9-10: Unremarkable. T10-11: Unremarkable. T11-12: Mild central narrowing of the  thecal sac due to disc bulge. IMPRESSION: 1. Oblique fracture of the T9 vertebral body extending from the anterior superior margin of the vertebral body to the inferior central endplate, nondisplaced. There is also a fracture of the tip of the T9 spinous process separating the spinous process from the flowing osteophytes along the supraspinous ligament. Both of these fractures were visible at CT. No subluxation is identified. 2. There is also a subtle nondisplaced fracture of the spinous process of T8. 3. Diffuse idiopathic skeletal hyperostosis. 4. Thoracic spondylosis and degenerative disc disease along with prominence of the dorsal epidural adipose tissues contribute to moderate central narrowing of the thecal sac at T8-9 and mild central narrowing of the thecal sac at T7-8 and T11-12. Electronically Signed   By: Van Clines M.D.   On: 07/29/2020 21:07   DG C-Arm 1-60 Min  Result Date: 07/30/2020 CLINICAL DATA:  Fracture EXAM: DG C-ARM 1-60 MIN; THORACIC SPINE 2 VIEWS CONTRAST:  None FLUOROSCOPY TIME:  Fluoroscopy Time:  14 seconds Number of Acquired Spot Images: 2 COMPARISON:  07/29/2020 FINDINGS: Two low resolution intraoperative spot views of the thoracic spine. The images demonstrate posterior spinal rods and transpedicular screws at the lower thoracic spine. Slightly limited landmarks on low resolution images. IMPRESSION: Intraoperative fluoroscopic assistance provided during thoracic spine surgery Electronically Signed   By: Donavan Foil M.D.   On: 07/30/2020 21:16     Labs:   Basic Metabolic Panel: Recent Labs  Lab 07/29/20 2310 07/30/20 0601  NA  --  138  K  --  4.1  CL  --  102  CO2  --  30  GLUCOSE  --  179*  BUN  --  21  CREATININE 1.25* 1.18  CALCIUM  --  9.0  MG  --  2.2   GFR Estimated Creatinine Clearance: 68.2 mL/min (by C-G formula based on SCr of 1.18 mg/dL). Liver Function Tests: Recent Labs  Lab 07/30/20 0601  AST 27  ALT 24  ALKPHOS 70  BILITOT 0.8   PROT 7.7  ALBUMIN 4.1   No results for input(s): LIPASE, AMYLASE in the last 168 hours. No results for input(s): AMMONIA in the last 168 hours. Coagulation profile No results for input(s): INR, PROTIME in the last 168 hours.  CBC: Recent Labs  Lab 07/29/20 2310 07/30/20 0601  WBC 9.0 11.3*  HGB 15.2 14.8  HCT 45.8 44.1  MCV 93.7 93.2  PLT 224 224   Cardiac Enzymes: No results for input(s): CKTOTAL, CKMB, CKMBINDEX, TROPONINI in the last 168 hours. BNP: Invalid input(s): POCBNP CBG: No results for input(s): GLUCAP in the last 168 hours. D-Dimer No results for input(s): DDIMER in the last 72 hours. Hgb A1c No results for input(s): HGBA1C in the last 72 hours. Lipid Profile No results for input(s): CHOL, HDL, LDLCALC, TRIG, CHOLHDL, LDLDIRECT in the last 72 hours. Thyroid function studies Recent Labs    07/30/20 0601  TSH 1.467   Anemia work up No results for input(s): VITAMINB12, FOLATE, FERRITIN, TIBC, IRON, RETICCTPCT in the last 72 hours. Microbiology Recent Results (from the past 240 hour(s))  Resp Panel by RT-PCR (Flu A&B, Covid) Nasopharyngeal Swab     Status: None   Collection Time: 07/29/20  7:42 PM  Specimen: Nasopharyngeal Swab; Nasopharyngeal(NP) swabs in vial transport medium  Result Value Ref Range Status   SARS Coronavirus 2 by RT PCR NEGATIVE NEGATIVE Final    Comment: (NOTE) SARS-CoV-2 target nucleic acids are NOT DETECTED.  The SARS-CoV-2 RNA is generally detectable in upper respiratory specimens during the acute phase of infection. The lowest concentration of SARS-CoV-2 viral copies this assay can detect is 138 copies/mL. A negative result does not preclude SARS-Cov-2 infection and should not be used as the sole basis for treatment or other patient management decisions. A negative result may occur with  improper specimen collection/handling, submission of specimen other than nasopharyngeal swab, presence of viral mutation(s) within the areas  targeted by this assay, and inadequate number of viral copies(<138 copies/mL). A negative result must be combined with clinical observations, patient history, and epidemiological information. The expected result is Negative.  Fact Sheet for Patients:  EntrepreneurPulse.com.au  Fact Sheet for Healthcare Providers:  IncredibleEmployment.be  This test is no t yet approved or cleared by the [place] FDA and  has been authorized for detection and/or diagnosis of SARS-CoV-2 by FDA under an Emergency Use Authorization (EUA). This EUA will remain  in effect (meaning this test can be used) for the duration of the COVID-19 declaration under Section 564(b)(1) of the Act, 21 U.S.C.section 360bbb-3(b)(1), unless the authorization is terminated  or revoked sooner.       Influenza A by PCR NEGATIVE NEGATIVE Final   Influenza B by PCR NEGATIVE NEGATIVE Final    Comment: (NOTE) The Xpert Xpress SARS-CoV-2/FLU/RSV plus assay is intended as an aid in the diagnosis of influenza from Nasopharyngeal swab specimens and should not be used as a sole basis for treatment. Nasal washings and aspirates are unacceptable for Xpert Xpress SARS-CoV-2/FLU/RSV testing.  Fact Sheet for Patients: EntrepreneurPulse.com.au  Fact Sheet for Healthcare Providers: IncredibleEmployment.be  This test is not yet approved or cleared by the [place] FDA and has been authorized for detection and/or diagnosis of SARS-CoV-2 by FDA under an Emergency Use Authorization (EUA). This EUA will remain in effect (meaning this test can be used) for the duration of the COVID-19 declaration under Section 564(b)(1) of the Act, 21 U.S.C. section 360bbb-3(b)(1), unless the authorization is terminated or revoked.  Performed at [facility], [address], [place], [place] [address]      Signed: Terrilee Croak  [facility] 08/01/2020,  10:59 AM

## 2020-08-01 NOTE — TOC Transition Note (Signed)
Transition of Care [facility]) - CM/SW Discharge Note   Patient Details  Name: Jacob Velasquez MRN: [id number] Date of Birth: [birth year]  Transition of Care [facility]) CM/SW Contact:  Izola Price, RN Phone Number: 08/01/2020, 11:56 AM   Clinical Narrative:   Patient to be discharged home with son. Per CM notes, refused HH/DME. PT did retrieve a RW from [employer] [facility] to send home. Requested order from provider. Simmie Davies RN CM     Final next level of care: Home/Self Care Barriers to Discharge: Barriers Resolved   Patient Goals and CMS Choice Patient states their goals for this hospitalization and ongoing recovery are:: go home      Discharge Placement                       Discharge Plan and Services   Discharge Planning Services: CM Consult            DME Arranged: [place] (Patient had refused HH or DME but PT was able to get him to accept a RW from [place]. Requested Order from provider.) DME Agency: [facility] Date DME Agency Contacted: 08/01/20 (From [facility] closet by PT)   Representative spoke with at DME Agency: From [facility] closet by PT HH Arranged: Refused SNF, Patient Refused [place] Agency: NA        Social Determinants of Health (SDOH) Interventions     Readmission Risk Interventions No flowsheet data found.

## 2020-08-01 NOTE — Progress Notes (Signed)
Neurosurgery progress Note  History: Jacob Velasquez is here for unstable T9 fracture.  DOS: 07/30/20 - T7-11 PSF, ORIF T9 fracture  POD2: Patient continues to do well and complains of some mid back pain but denies any leg pain.  His drain put out approximately 20 cc over last 24 hours.  He has worked with physical therapy.  His postop films were completed.  Physical Exam: Vitals:   08/01/20 0558 08/01/20 0803  BP: 132/75 125/80  Pulse: 80 60  Resp: 18 20  Temp: 98.2 F (36.8 C) 98.7 F (37.1 C)  SpO2: 97% 96%    AA Ox3 Strength:5/5 throughout bilateral lower extremities Sensation decreased over the left side, at baseline Drain 20 cc out  Data:  Recent Labs  Lab 07/30/20 0601  NA 138  K 4.1  CL 102  CO2 30  BUN 21  CREATININE 1.18  GLUCOSE 179*  CALCIUM 9.0   Recent Labs  Lab 07/30/20 0601  AST 27  ALT 24  ALKPHOS 70     Recent Labs  Lab 07/29/20 2310 07/30/20 0601  WBC 9.0 11.3*  HGB 15.2 14.8  HCT 45.8 44.1  PLT 224 224   No results for input(s): APTT, INR in the last 168 hours.      Imaging: Thoracic spine x-rays completed stable placement of pedicle screws and T7-T11   Assessment/Plan:  Jacob Velasquez is doing well after T7-11 fracture for unstable T9 fracture.  -Continue brace when out of bed - pain control -recommend continue oral regimen - DVT prophylaxis - ok to start lovenox, baby aspirin to be started postop day 7 - PTOT - prefer no nicotine replacement -Wound VAC and drain removed at bedside, dressing replaced.  Patient is cleared from our perspective for discharge.   Deetta Perla, MD Department of Neurosurgery

## 2020-08-03 ENCOUNTER — Other Ambulatory Visit: Payer: Self-pay | Admitting: Neurosurgery

## 2020-08-03 MED ORDER — OXYCODONE HCL 5 MG PO TABS: 5.0000 mg | ORAL_TABLET | Freq: Four times a day (QID) | ORAL | 0 refills | Status: AC | PRN

## 2020-08-12 NOTE — Anesthesia Postprocedure Evaluation (Signed)
Anesthesia Post Note  Patient: Jacob Velasquez  Procedure(s) Performed: FUSION POSTERIOR SPINAL MULTILEVEL Posterior Thoracic Fusion T7-T11 (Spine Thoracic)  Patient location during evaluation: PACU Anesthesia Type: General Level of consciousness: awake and alert Pain management: pain level controlled Vital Signs Assessment: post-procedure vital signs reviewed and stable Respiratory status: spontaneous breathing, nonlabored ventilation, respiratory function stable and patient connected to nasal cannula oxygen Cardiovascular status: blood pressure returned to baseline and stable Postop Assessment: no apparent nausea or vomiting Anesthetic complications: no   No notable events documented.   Last Vitals:  Vitals:   08/01/20 0558 08/01/20 0803  BP: 132/75 125/80  Pulse: 80 60  Resp: 18 20  Temp: 36.8 C 37.1 C  SpO2: 97% 96%    Last Pain:  Vitals:   08/01/20 1046  TempSrc:   PainSc: 5                  Molli Barrows

## 2021-02-21 ENCOUNTER — Other Ambulatory Visit: Payer: Self-pay

## 2021-02-21 ENCOUNTER — Encounter: Payer: Self-pay | Admitting: Emergency Medicine

## 2021-02-21 ENCOUNTER — Inpatient Hospital Stay: Payer: Medicare Other

## 2021-02-21 ENCOUNTER — Inpatient Hospital Stay
Admission: EM | Admit: 2021-02-21 | Discharge: 2021-02-23 | DRG: 123 | Disposition: A | Discharge status: Home / Self Care | Payer: Medicare Other | Attending: Student in an Organized Health Care Education/Training Program | Admitting: Student in an Organized Health Care Education/Training Program

## 2021-02-21 ENCOUNTER — Emergency Department: Payer: Medicare Other

## 2021-02-21 DIAGNOSIS — Z66 Do not resuscitate: Secondary | ICD-10-CM | POA: Diagnosis present

## 2021-02-21 DIAGNOSIS — I69319 Unspecified symptoms and signs involving cognitive functions following cerebral infarction: Secondary | ICD-10-CM | POA: Diagnosis not present

## 2021-02-21 DIAGNOSIS — Z79899 Other long term (current) drug therapy: Secondary | ICD-10-CM | POA: Diagnosis not present

## 2021-02-21 DIAGNOSIS — Z87891 Personal history of nicotine dependence: Secondary | ICD-10-CM

## 2021-02-21 DIAGNOSIS — I480 Paroxysmal atrial fibrillation: Secondary | ICD-10-CM | POA: Diagnosis present

## 2021-02-21 DIAGNOSIS — E785 Hyperlipidemia, unspecified: Secondary | ICD-10-CM | POA: Diagnosis present

## 2021-02-21 DIAGNOSIS — R739 Hyperglycemia, unspecified: Secondary | ICD-10-CM | POA: Diagnosis present

## 2021-02-21 DIAGNOSIS — I4891 Unspecified atrial fibrillation: Secondary | ICD-10-CM

## 2021-02-21 DIAGNOSIS — Z981 Arthrodesis status: Secondary | ICD-10-CM

## 2021-02-21 DIAGNOSIS — Z7982 Long term (current) use of aspirin: Secondary | ICD-10-CM

## 2021-02-21 DIAGNOSIS — I10 Essential (primary) hypertension: Secondary | ICD-10-CM | POA: Diagnosis present

## 2021-02-21 DIAGNOSIS — I639 Cerebral infarction, unspecified: Secondary | ICD-10-CM

## 2021-02-21 DIAGNOSIS — H02402 Unspecified ptosis of left eyelid: Secondary | ICD-10-CM | POA: Diagnosis present

## 2021-02-21 DIAGNOSIS — F17210 Nicotine dependence, cigarettes, uncomplicated: Secondary | ICD-10-CM | POA: Diagnosis present

## 2021-02-21 DIAGNOSIS — G529 Cranial nerve disorder, unspecified: Secondary | ICD-10-CM | POA: Diagnosis not present

## 2021-02-21 DIAGNOSIS — H532 Diplopia: Principal | ICD-10-CM | POA: Diagnosis present

## 2021-02-21 DIAGNOSIS — H4902 Third [oculomotor] nerve palsy, left eye: Secondary | ICD-10-CM

## 2021-02-21 HISTORY — DX: Unspecified atrial fibrillation: I48.91

## 2021-02-21 HISTORY — DX: Transient cerebral ischemic attack, unspecified: G45.9

## 2021-02-21 LAB — COMPREHENSIVE METABOLIC PANEL
ALT: 16 U/L (ref 0–44)
AST: 21 U/L (ref 15–41)
Albumin: 3.8 g/dL (ref 3.5–5.0)
Alkaline Phosphatase: 68 U/L (ref 38–126)
Anion gap: 8 (ref 5–15)
BUN: 17 mg/dL (ref 8–23)
CO2: 24 mmol/L (ref 22–32)
Calcium: 9 mg/dL (ref 8.9–10.3)
Chloride: 104 mmol/L (ref 98–111)
Creatinine, Ser: 1.24 mg/dL (ref 0.61–1.24)
GFR, Estimated: >60 mL/min (ref >60)
Glucose, Bld: 139 mg/dL — ABNORMAL HIGH (ref 70–99)
Potassium: 3.6 mmol/L (ref 3.5–5.1)
Sodium: 136 mmol/L (ref 135–145)
Total Bilirubin: 1.4 mg/dL — ABNORMAL HIGH (ref 0.3–1.2)
Total Protein: 7.3 g/dL (ref 6.5–8.1)

## 2021-02-21 LAB — CBC
HCT: 47.1 % (ref 39.0–52.0)
Hemoglobin: 15.6 g/dL (ref 13.0–17.0)
MCH: 30.2 pg (ref 26.0–34.0)
MCHC: 33.1 g/dL (ref 30.0–36.0)
MCV: 91.1 fL (ref 80.0–100.0)
Platelets: 254 10*3/uL (ref 150–400)
RBC: 5.17 MIL/uL (ref 4.22–5.81)
RDW: 14.1 % (ref 11.5–15.5)
WBC: 7.8 10*3/uL (ref 4.0–10.5)
nRBC: 0 % (ref 0.0–0.2)

## 2021-02-21 LAB — DIFFERENTIAL
Abs Immature Granulocytes: 0.02 10*3/uL (ref 0.00–0.07)
Basophils Absolute: 0.1 10*3/uL (ref 0.0–0.1)
Basophils Relative: 1 %
Eosinophils Absolute: 0.1 10*3/uL (ref 0.0–0.5)
Eosinophils Relative: 2 %
Immature Granulocytes: 0 %
Lymphocytes Relative: 22 %
Lymphs Abs: 1.7 10*3/uL (ref 0.7–4.0)
Monocytes Absolute: 0.6 10*3/uL (ref 0.1–1.0)
Monocytes Relative: 8 %
Neutro Abs: 5.2 10*3/uL (ref 1.7–7.7)
Neutrophils Relative %: 67 %

## 2021-02-21 LAB — PROTIME-INR
INR: 1 (ref 0.8–1.2)
Prothrombin Time: 12.9 seconds (ref 11.4–15.2)

## 2021-02-21 LAB — CBG MONITORING, ED: Glucose-Capillary: 113 mg/dL — ABNORMAL HIGH (ref 70–99)

## 2021-02-21 LAB — APTT: aPTT: 31 seconds (ref 24–36)

## 2021-02-21 MED ORDER — SODIUM CHLORIDE 0.9 % IV SOLN: INTRAVENOUS | Status: DC

## 2021-02-21 MED ORDER — STROKE: EARLY STAGES OF RECOVERY BOOK: Freq: Once | Status: DC

## 2021-02-21 MED ORDER — ACETAMINOPHEN 325 MG PO TABS
650.0000 mg | ORAL_TABLET | ORAL | Status: DC | PRN
  Administered 2021-02-22 (×2): 650 mg via ORAL
  Filled 2021-02-21 (×2): qty 2

## 2021-02-21 MED ORDER — ACETAMINOPHEN 160 MG/5ML PO SOLN
650.0000 mg | ORAL | Status: DC | PRN
  Filled 2021-02-21: qty 20.3

## 2021-02-21 MED ORDER — HEPARIN SODIUM (PORCINE) 5000 UNIT/ML IJ SOLN
5000.0000 [IU] | Freq: Two times a day (BID) | INTRAMUSCULAR | Status: DC
  Administered 2021-02-22 (×3): 5000 [IU] via SUBCUTANEOUS
  Filled 2021-02-21 (×3): qty 1

## 2021-02-21 MED ORDER — ASPIRIN 300 MG RE SUPP: 300.0000 mg | Freq: Every day | RECTAL | Status: DC

## 2021-02-21 MED ORDER — ASPIRIN 325 MG PO TABS: 325.0000 mg | ORAL_TABLET | Freq: Every day | ORAL | Status: DC

## 2021-02-21 MED ORDER — CLOPIDOGREL BISULFATE 75 MG PO TABS
75.0000 mg | ORAL_TABLET | Freq: Every day | ORAL | Status: DC
  Administered 2021-02-22 – 2021-02-23 (×2): 75 mg via ORAL
  Filled 2021-02-21 (×2): qty 1

## 2021-02-21 MED ORDER — ASPIRIN EC 81 MG PO TBEC
81.0000 mg | DELAYED_RELEASE_TABLET | Freq: Every day | ORAL | Status: DC
  Administered 2021-02-22 – 2021-02-23 (×2): 81 mg via ORAL
  Filled 2021-02-21 (×2): qty 1

## 2021-02-21 MED ORDER — ACETAMINOPHEN 650 MG RE SUPP: 650.0000 mg | RECTAL | Status: DC | PRN

## 2021-02-21 NOTE — H&P (Incomplete Revision)
History and Physical    Jacob Velasquez OVF:[id number] DOB: [birth year] DOA: 02/21/2021  PCP: Baxter Hire, MD    Patient coming from:  Home    Chief Complaint:  Double vision.   HPI:  Jacob Velasquez is a 76 y.o. male seen in ed with complaints of double vision since waking up today.  It has been going on all throughout the day consistently and now he clearly sees 2 items or 2 people standing side to side horizontally.  And double vision resolves when he covers either of his eyes.  No pain no tenderness no redness no discharge no trauma patient does have a history of stroke in the past long time ago and currently is taking his aspirin blood pressure and cholesterol medications.  Patient is not aware of his A. fib and has never been told of having atrial fibrillation.  Patient does not report any blurred vision speech issues gait issues no chest pain shortness of breath no fevers chills bowel movement changes or urinary changes.  Pt has past medical history of hypertension, dyslipidemia, history of stroke.\ ED Course:   Vitals:   02/21/21 1757 02/21/21 1759 02/21/21 2042 02/21/21 2200  BP:  (!) 138/93 (!) 165/99 (!) 171/95  Pulse:  88 85 67  Resp:  18 12   Temp:  97.8 F (36.6 C)    TempSrc:  Oral    SpO2:  95% 98% 98%  Weight: 97.5 kg     Height: 6\' 2"  (1.88 m)      In the emergency room patient is irate allows me to examine him vitals are stable blood pressure is elevated. CMP shows a glucose of 139, total bili of 1.4, CBC is within normal limits, CT head is negative for any acute processes. MRI is pending.   Review of Systems:  Review of Systems  Eyes:  Positive for double vision.  All other systems reviewed and are negative.  Past Medical History:  Diagnosis Date   Hypertension    TIA (transient ischemic attack)     Past Surgical History:  Procedure Laterality Date   SPINAL FUSION N/A 07/30/2020   Procedure: FUSION POSTERIOR SPINAL MULTILEVEL Posterior  Thoracic Fusion T7-T11;  Surgeon: Meade Maw, MD;  Location: [facility] ORS;  Service: Neurosurgery;  Laterality: N/A;     reports that he has been smoking cigarettes. He has never used smokeless tobacco. He reports that he does not drink alcohol and does not use drugs.  No Known Allergies  History reviewed. No pertinent family history.  Prior to Admission medications   Medication Sig Start Date End Date Taking? Authorizing Provider  aspirin 81 MG EC tablet Take 1 tablet (81 mg total) by mouth daily. Swallow whole. 08/07/20   Terrilee Croak, MD  atorvastatin (LIPITOR) 40 MG tablet Take 1 tablet (40 mg total) by mouth daily. 01/02/20 07/30/20  Max Sane, MD  hydrochlorothiazide (HYDRODIURIL) 12.5 MG tablet Take 12.5 mg by mouth daily. 07/16/20   [provider]  losartan (COZAAR) 50 MG tablet Take 50 mg by mouth daily. 05/25/20   [provider]    Physical Exam: Vitals:   02/21/21 1757 02/21/21 1759 02/21/21 2042 02/21/21 2200  BP:  (!) 138/93 (!) 165/99 (!) 171/95  Pulse:  88 85 67  Resp:  18 12   Temp:  97.8 F (36.6 C)    TempSrc:  Oral    SpO2:  95% 98% 98%  Weight: 97.5 kg     Height: 6'  2" (1.88 m)      Physical Exam Vitals and nursing note reviewed.  Constitutional:      General: He is not in acute distress.    Appearance: Normal appearance. He is not ill-appearing.  HENT:     Head: Normocephalic and atraumatic.     Right Ear: External ear normal.     Left Ear: External ear normal.     Nose: Nose normal.     Mouth/Throat:     Mouth: Mucous membranes are dry.  Eyes:     Extraocular Movements: Extraocular movements intact.     Pupils: Pupils are equal, round, and reactive to light.  Neck:     Vascular: No carotid bruit.  Cardiovascular:     Rate and Rhythm: Normal rate. Rhythm irregular.     Pulses: Normal pulses.     Heart sounds: Normal heart sounds.  Pulmonary:     Effort: Pulmonary effort is normal.     Breath sounds: Normal breath sounds.   Abdominal:     General: Bowel sounds are normal. There is no distension.     Palpations: Abdomen is soft. There is no mass.     Tenderness: There is no abdominal tenderness. There is no guarding.     Hernia: No hernia is present.  Musculoskeletal:     Right lower leg: No edema.     Left lower leg: No edema.  Skin:    General: Skin is warm.  Neurological:     General: No focal deficit present.     Mental Status: He is alert and oriented to person, place, and time.  Psychiatric:        Mood and Affect: Mood normal.        Behavior: Behavior normal.   Labs on Admission: I have personally reviewed following labs and imaging studies No results for input(s): CKTOTAL, CKMB, TROPONINI in the last 72 hours. Lab Results  Component Value Date   WBC 7.8 02/21/2021   HGB 15.6 02/21/2021   HCT 47.1 02/21/2021   MCV 91.1 02/21/2021   PLT 254 02/21/2021    Recent Labs  Lab 02/21/21 1813  NA 136  K 3.6  CL 104  CO2 24  BUN 17  CREATININE 1.24  CALCIUM 9.0  PROT 7.3  BILITOT 1.4*  ALKPHOS 68  ALT 16  AST 21  GLUCOSE 139*   Lab Results  Component Value Date   CHOL 221 (H) 01/02/2020   HDL 49 01/02/2020   LDLCALC 154 (H) 01/02/2020   TRIG 90 01/02/2020   No results found for: DDIMER Invalid input(s): POCBNP   COVID-19 Labs No results for input(s): DDIMER, FERRITIN, LDH, CRP in the last 72 hours. Lab Results  Component Value Date   [place] NEGATIVE 07/29/2020   [place] NEGATIVE 01/01/2020    Radiological Exams on Admission: CT HEAD WO CONTRAST  Result Date: 02/21/2021 CLINICAL DATA:  Seizure, new-onset, no history of trauma. Double vision. EXAM: CT HEAD WITHOUT CONTRAST TECHNIQUE: Contiguous axial images were obtained from the base of the skull through the vertex without intravenous contrast. RADIATION DOSE REDUCTION: This exam was performed according to the departmental dose-optimization program which includes automated exposure control, adjustment of the mA  and/or kV according to patient size and/or use of iterative reconstruction technique. COMPARISON:  07/30/2020 FINDINGS: Brain: There is atrophy and chronic small vessel disease changes. No acute intracranial abnormality. Specifically, no hemorrhage, hydrocephalus, mass lesion, acute infarction, or significant intracranial injury. Vascular: No hyperdense vessel or unexpected  calcification. Skull: No acute calvarial abnormality. Sinuses/Orbits: No acute findings Other: None IMPRESSION: Atrophy, chronic microvascular disease. No acute intracranial abnormality. Electronically Signed   By: Rolm Baptise M.D.   On: 02/21/2021 18:39    EKG: Independently reviewed.  A. fib at 88 with QTC of 430.   Assessment/Plan: Principal Problem:   Diplopia Active Problems:   CVA, old, cognitive deficits   AF (paroxysmal atrial fibrillation) (HCC)   Essential hypertension   History of tobacco abuse   Diplopia/history of old CVA: Suspect TIA/CVA: Await MRI and will start patient on aspirin 81 mg and plavix 75 mg. Patient has a chads score of 5 with a 7.2% risk of stroke. Once MRI is resulted we will decide on anticoagulation plan. In addition to antiplatelet therapy we will also start patient on statin therapy.  Atrial fibrillation: New onset atrial fibrillation with a CHADS2 score of 5 and a stroke risk of 7.2%.  Discussed with patient about risk of anticoagulation as far as bleeding and the risk of not being anticoagulated and strokes.  And recommendations of anticoagulation.  Thyroid function studies.  Hypertension: Blood pressure (!) 171/95, pulse 67, temperature 97.8 F (36.6 C), temperature source Oral, resp. rate 12, height 6\' 2"  (1.88 m), weight 97.5 kg, SpO2 98 %. Permissive hypertension overnight for the next 24 hours.  History of tobacco abuse: Patient no longer smokes.  Elevated glucose: No history of diabetes however in this patient with risk factors we will check an A1c.   DVT prophylaxis:   SCDs  Code Status:  Full code  Family Communication:   Viveros,JEFF (Son)  [phone] (Home Phone)  Disposition Plan:  Home  Consults called:  None  Admission status: Inpatient   Medical Decision Making   Coding    Para Skeans MD [facility]  6 PM- 2 AM. Please contact me via secure Chat 6 PM-2 AM. To contact the [facility] Attending or Consulting provider [place] or covering provider during after hours [place], for this patient.   Check the care team in [facility] and look for a) attending/consulting [facility] provider listed and b) the [facility] team listed Log into [email or web address] and use [place]'s universal password to access. If you do not have the password, please contact the hospital operator. Locate the [facility] provider you are looking for under [facility] and page to a number that you can be directly reached. If you still have difficulty reaching the provider, please page the [facility] (Director on Call) for the Hospitalists listed on amion for assistance. [email or web address] 02/21/2021, 11:56 PM

## 2021-02-21 NOTE — ED Triage Notes (Signed)
Pt via POV from home. Pt c/o double vision since he woke up this AM. Pt states it feels like he cannot focus. States when he closes one eye it gets better. Pt states that he had a mini-stroke in the past. Denies pain. Denies injury. Pt is A&OX4 and NAD.

## 2021-02-21 NOTE — ED Provider Notes (Signed)
[facility] Provider Note    Event Date/Time   First MD Initiated Contact with Patient 02/21/21 2044     (approximate)   History   Visual Field Change   HPI  Jacob Velasquez is a 76 y.o. male with history of stroke on aspirin here with visual change.  Patient states he woke up today with double vision.  Throughout the day, it has initially mildly improved but now returned to as severe as it was.  He endorses persistent, severe, double vision that resolves completely when he covers up either of his eyes.  Denies any pain in his eyes.  No blurred vision.  He feels like his acuity is normal when he covers up at night.  He has a history of stroke with some transient left-sided numbness, that has essentially resolved, and is on aspirin and blood pressure and cholesterol control for this.  He does not recall being diagnosed with A. fib.  No seizures.  No recent trauma.  No numbness or weakness.  Denies any difficulty speaking or swallowing.     Physical Exam   Triage Vital Signs: ED Triage Vitals  Enc Vitals Group     BP 02/21/21 1759 (!) 138/93     Pulse Rate 02/21/21 1759 88     Resp 02/21/21 1759 18     Temp 02/21/21 1759 97.8 F (36.6 C)     Temp Source 02/21/21 1756 Oral     SpO2 02/21/21 1759 95 %     Weight 02/21/21 1757 215 lb (97.5 kg)     Height 02/21/21 1757 6\' 2"  (1.88 m)     Head Circumference --      Peak Flow --      Pain Score 02/21/21 2042 0     Pain Loc --      Pain Edu? --      Excl. in [place]? --     Most recent vital signs: Vitals:   02/21/21 2042 02/21/21 2200  BP: (!) 165/99 (!) 171/95  Pulse: 85 67  Resp: 12   Temp:    SpO2: 98% 98%     General: Awake, no distress.  CV:  Good peripheral perfusion.  Irregularly irregular, no appreciable murmurs. Resp:  Normal effort.  Lungs clear to auscultation bilaterally. Abd:  No distention.  No tenderness. Other:  Disconjugate gaze with diplopia that resolves completely with  covering either eye.  Subtle left nasolabial flattening.  Strength 5 out of 5 bilateral upper and lower extremities.  Normal sensation light touch bilateral upper and lower extremities.  Normal gait.  No dysmetria.   ED Results / Procedures / Treatments   Labs (all labs ordered are listed, but only abnormal results are displayed) Labs Reviewed  COMPREHENSIVE METABOLIC PANEL - Abnormal; Notable for the following components:      Result Value   Glucose, Bld 139 (*)    Total Bilirubin 1.4 (*)    All other components within normal limits  CBG MONITORING, ED - Abnormal; Notable for the following components:   Glucose-Capillary 113 (*)    All other components within normal limits  PROTIME-INR  APTT  CBC  DIFFERENTIAL  RAPID URINE DRUG SCREEN, HOSP PERFORMED  HEMOGLOBIN A1C  LIPID PANEL     EKG Atrial fibrillation, ventricular rate 88.  QRS 90, QTc 430.  When compared to prior, atrial fibrillation has replaced sinus rhythm.  No acute ST elevations.   RADIOLOGY CT head: No acute intracranial normality  I also independently reviewed and agree wit radiologist interpretations.   PROCEDURES:  Critical Care performed: No  .1-3 Lead EKG Interpretation Performed by: Duffy Bruce, MD Authorized by: Duffy Bruce, MD     Interpretation: abnormal     ECG rate:  80-100   ECG rate assessment: tachycardic     Rhythm: atrial fibrillation     Ectopy: none     Conduction: normal   Comments:     Indication: Stroke    MEDICATIONS ORDERED IN ED: Medications   stroke: mapping our early stages of recovery book (has no administration in time range)  0.9 %  sodium chloride infusion (has no administration in time range)  acetaminophen (TYLENOL) tablet 650 mg (has no administration in time range)    Or  acetaminophen (TYLENOL) 160 MG/5ML solution 650 mg (has no administration in time range)    Or  acetaminophen (TYLENOL) suppository 650 mg (has no administration in time range)   heparin injection 5,000 Units (has no administration in time range)  aspirin EC tablet 81 mg (has no administration in time range)  clopidogrel (PLAVIX) tablet 75 mg (has no administration in time range)  gadobutrol (GADAVIST) 1 MMOL/ML injection 10 mL (has no administration in time range)     IMPRESSION / MDM / ASSESSMENT AND PLAN / ED COURSE  I reviewed the triage vital signs and the nursing notes.                               The patient is on the cardiac monitor to evaluate for evidence of arrhythmia and/or significant heart rate changes.   MDM:  76 yo M here with diplopia. Exam is concerning for central etiology, likely CVA vs mass vs aneurysm. The eyes themselves show no signs of abnormality. Sx resolve when covering one eye. CT head obtained, reviewed, and is negative. Of note, EKG shows AFib and this appears new based on review of his records. Will need additional anticoagulation. Otherwise, CBC without significant leukocytosis or other abnormality. CMP normal. No evidence to suggest seizures.   Will admit to Hospitalists. Pt updated and in agreement, along with son. I reviewed his prior records and PCP visits - no mention of afib.   MEDICATIONS GIVEN IN ED: Medications   stroke: mapping our early stages of recovery book (has no administration in time range)  0.9 %  sodium chloride infusion (has no administration in time range)  acetaminophen (TYLENOL) tablet 650 mg (has no administration in time range)    Or  acetaminophen (TYLENOL) 160 MG/5ML solution 650 mg (has no administration in time range)    Or  acetaminophen (TYLENOL) suppository 650 mg (has no administration in time range)  heparin injection 5,000 Units (has no administration in time range)  aspirin EC tablet 81 mg (has no administration in time range)  clopidogrel (PLAVIX) tablet 75 mg (has no administration in time range)  gadobutrol (GADAVIST) 1 MMOL/ML injection 10 mL (has no administration in time range)      Consults:  Hospitalist Dr. Posey Pronto, consulted and will admit   EMR reviewed  PCP visits from Dr. Wynetta Emery Prior notes from Neurology and admission for CVA No mention of AFib in old records     FINAL CLINICAL IMPRESSION(S) / ED DIAGNOSES   Final diagnoses:  Cerebrovascular accident (CVA), unspecified mechanism ([place])  Diplopia  New onset atrial fibrillation ([place])     Rx / DC Orders   ED  Discharge Orders     None        Note:  This document was prepared using Dragon voice recognition software and may include unintentional dictation errors.   Duffy Bruce, MD 02/22/21 [phone]

## 2021-02-21 NOTE — ED Notes (Signed)
Pt left eye slight droop, per pt and visible to this RN, IPMD notified.

## 2021-02-21 NOTE — H&P (Addendum)
History and Physical    Lawyer Washabaugh OQH:[id number] DOB: September 18, 1945 DOA: 02/21/2021  PCP: Baxter Hire, MD    Patient coming from:  Home    Chief Complaint:  Double vision.   HPI:  Jacob Velasquez is a 76 y.o. male seen in ed with complaints of double vision since waking up today.  It has been going on all throughout the day consistently and now he clearly sees 2 items or 2 people standing side to side horizontally.  And double vision resolves when he covers either of his eyes.  No pain no tenderness no redness no discharge no trauma patient does have a history of stroke in the past long time ago and currently is taking his aspirin blood pressure and cholesterol medications.  Patient is not aware of his A. fib and has never been told of having atrial fibrillation.  Patient does not report any blurred vision speech issues gait issues no chest pain shortness of breath no fevers chills bowel movement changes or urinary changes.  Pt has past medical history of hypertension, dyslipidemia, history of stroke.\ ED Course:   Vitals:   02/21/21 1757 02/21/21 1759 02/21/21 2042 02/21/21 2200  BP:  (!) 138/93 (!) 165/99 (!) 171/95  Pulse:  88 85 67  Resp:  18 12   Temp:  97.8 F (36.6 C)    TempSrc:  Oral    SpO2:  95% 98% 98%  Weight: 97.5 kg     Height: 6\' 2"  (1.88 m)      In the emergency room patient is irate allows me to examine him vitals are stable blood pressure is elevated. CMP shows a glucose of 139, total bili of 1.4, CBC is within normal limits, CT head is negative for any acute processes. MRI is pending.   Review of Systems:  Review of Systems  Eyes:  Positive for double vision.  All other systems reviewed and are negative.  Past Medical History:  Diagnosis Date   Hypertension    TIA (transient ischemic attack)     Past Surgical History:  Procedure Laterality Date   SPINAL FUSION N/A 07/30/2020   Procedure: FUSION POSTERIOR SPINAL MULTILEVEL Posterior  Thoracic Fusion T7-T11;  Surgeon: Meade Maw, MD;  Location: [facility] ORS;  Service: Neurosurgery;  Laterality: N/A;     reports that he has been smoking cigarettes. He has never used smokeless tobacco. He reports that he does not drink alcohol and does not use drugs.  No Known Allergies  History reviewed. No pertinent family history.  Prior to Admission medications   Medication Sig Start Date End Date Taking? Authorizing Provider  aspirin 81 MG EC tablet Take 1 tablet (81 mg total) by mouth daily. Swallow whole. 08/07/20   Terrilee Croak, MD  atorvastatin (LIPITOR) 40 MG tablet Take 1 tablet (40 mg total) by mouth daily. 01/02/20 07/30/20  Max Sane, MD  hydrochlorothiazide (HYDRODIURIL) 12.5 MG tablet Take 12.5 mg by mouth daily. 07/16/20   [provider]  losartan (COZAAR) 50 MG tablet Take 50 mg by mouth daily. 05/25/20   [provider]    Physical Exam: Vitals:   02/21/21 1757 02/21/21 1759 02/21/21 2042 02/21/21 2200  BP:  (!) 138/93 (!) 165/99 (!) 171/95  Pulse:  88 85 67  Resp:  18 12   Temp:  97.8 F (36.6 C)    TempSrc:  Oral    SpO2:  95% 98% 98%  Weight: 97.5 kg     Height: 6'  2" (1.88 m)      Physical Exam Vitals and nursing note reviewed.  Constitutional:      General: He is not in acute distress.    Appearance: Normal appearance. He is not ill-appearing.  HENT:     Head: Normocephalic and atraumatic.     Right Ear: External ear normal.     Left Ear: External ear normal.     Nose: Nose normal.     Mouth/Throat:     Mouth: Mucous membranes are dry.  Eyes:     Extraocular Movements: Extraocular movements intact.     Pupils: Pupils are equal, round, and reactive to light.  Neck:     Vascular: No carotid bruit.  Cardiovascular:     Rate and Rhythm: Normal rate. Rhythm irregular.     Pulses: Normal pulses.     Heart sounds: Normal heart sounds.  Pulmonary:     Effort: Pulmonary effort is normal.     Breath sounds: Normal breath sounds.   Abdominal:     General: Bowel sounds are normal. There is no distension.     Palpations: Abdomen is soft. There is no mass.     Tenderness: There is no abdominal tenderness. There is no guarding.     Hernia: No hernia is present.  Musculoskeletal:     Right lower leg: No edema.     Left lower leg: No edema.  Skin:    General: Skin is warm.  Neurological:     General: No focal deficit present.     Mental Status: He is alert and oriented to person, place, and time.  Psychiatric:        Mood and Affect: Mood normal.        Behavior: Behavior normal.   Labs on Admission: I have personally reviewed following labs and imaging studies No results for input(s): CKTOTAL, CKMB, TROPONINI in the last 72 hours. Lab Results  Component Value Date   WBC 7.8 02/21/2021   HGB 15.6 02/21/2021   HCT 47.1 02/21/2021   MCV 91.1 02/21/2021   PLT 254 02/21/2021    Recent Labs  Lab 02/21/21 1813  NA 136  K 3.6  CL 104  CO2 24  BUN 17  CREATININE 1.24  CALCIUM 9.0  PROT 7.3  BILITOT 1.4*  ALKPHOS 68  ALT 16  AST 21  GLUCOSE 139*   Lab Results  Component Value Date   CHOL 221 (H) 01/02/2020   HDL 49 01/02/2020   LDLCALC 154 (H) 01/02/2020   TRIG 90 01/02/2020   No results found for: DDIMER Invalid input(s): POCBNP   COVID-19 Labs No results for input(s): DDIMER, FERRITIN, LDH, CRP in the last 72 hours. Lab Results  Component Value Date   [place] NEGATIVE 07/29/2020   [place] NEGATIVE 01/01/2020    Radiological Exams on Admission: CT HEAD WO CONTRAST  Result Date: 02/21/2021 CLINICAL DATA:  Seizure, new-onset, no history of trauma. Double vision. EXAM: CT HEAD WITHOUT CONTRAST TECHNIQUE: Contiguous axial images were obtained from the base of the skull through the vertex without intravenous contrast. RADIATION DOSE REDUCTION: This exam was performed according to the departmental dose-optimization program which includes automated exposure control, adjustment of the mA  and/or kV according to patient size and/or use of iterative reconstruction technique. COMPARISON:  07/30/2020 FINDINGS: Brain: There is atrophy and chronic small vessel disease changes. No acute intracranial abnormality. Specifically, no hemorrhage, hydrocephalus, mass lesion, acute infarction, or significant intracranial injury. Vascular: No hyperdense vessel or unexpected  calcification. Skull: No acute calvarial abnormality. Sinuses/Orbits: No acute findings Other: None IMPRESSION: Atrophy, chronic microvascular disease. No acute intracranial abnormality. Electronically Signed   By: Rolm Baptise M.D.   On: 02/21/2021 18:39    EKG: Independently reviewed.  A. fib at 88 with QTC of 430.   Assessment/Plan: Principal Problem:   Diplopia Active Problems:   CVA, old, cognitive deficits   AF (paroxysmal atrial fibrillation) (HCC)   Essential hypertension   History of tobacco abuse   Diplopia/history of old CVA: Suspect TIA/CVA: Await MRI and will start patient on aspirin 81 mg and plavix 75 mg. Patient has a chads score of 5 with a 7.2% risk of stroke. Once MRI is resulted we will decide on anticoagulation plan. In addition to antiplatelet therapy we will also start patient on statin therapy.  Atrial fibrillation: New onset atrial fibrillation with a CHADS2 score of 5 and a stroke risk of 7.2%.  Discussed with patient about risk of anticoagulation as far as bleeding and the risk of not being anticoagulated and strokes.  And recommendations of anticoagulation.  Thyroid function studies.  Hypertension: Blood pressure (!) 171/95, pulse 67, temperature 97.8 F (36.6 C), temperature source Oral, resp. rate 12, height 6\' 2"  (1.88 m), weight 97.5 kg, SpO2 98 %. Permissive hypertension overnight for the next 24 hours.  History of tobacco abuse: Patient no longer smokes.  Elevated glucose: No history of diabetes however in this patient with risk factors we will check an A1c.   DVT prophylaxis:   SCDs  Code Status:  Full code  Family Communication:   Erkkila,JEFF (Son)  [phone] (Home Phone)  Disposition Plan:  Home  Consults called:  None  Admission status: Inpatient   Medical Decision Making   Coding    Para Skeans MD [facility]  6 PM- 2 AM. Please contact me via secure Chat 6 PM-2 AM. To contact the [facility] Attending or Consulting provider [place] or covering provider during after hours [place], for this patient.   Check the care team in [facility] and look for a) attending/consulting [facility] provider listed and b) the [facility] team listed Log into [email or web address] and use [place]'s universal password to access. If you do not have the password, please contact the hospital operator. Locate the [facility] provider you are looking for under [facility] and page to a number that you can be directly reached. If you still have difficulty reaching the provider, please page the [facility] (Director on Call) for the Hospitalists listed on amion for assistance. [email or web address] 02/21/2021, 11:56 PM

## 2021-02-21 NOTE — ED Notes (Signed)
Pt states double vision beginning this morning, denies HA, pain or other visual symptoms.  NAD at this time.

## 2021-02-22 DIAGNOSIS — Z87891 Personal history of nicotine dependence: Secondary | ICD-10-CM

## 2021-02-22 DIAGNOSIS — I4891 Unspecified atrial fibrillation: Secondary | ICD-10-CM

## 2021-02-22 DIAGNOSIS — I480 Paroxysmal atrial fibrillation: Secondary | ICD-10-CM

## 2021-02-22 DIAGNOSIS — I69319 Unspecified symptoms and signs involving cognitive functions following cerebral infarction: Secondary | ICD-10-CM

## 2021-02-22 DIAGNOSIS — G529 Cranial nerve disorder, unspecified: Secondary | ICD-10-CM

## 2021-02-22 DIAGNOSIS — I10 Essential (primary) hypertension: Secondary | ICD-10-CM

## 2021-02-22 LAB — LIPID PANEL
Cholesterol: 153 mg/dL (ref 0–200)
HDL: 52 mg/dL (ref >40)
LDL Cholesterol: 90 mg/dL (ref 0–99)
Total CHOL/HDL Ratio: 2.9 RATIO
Triglycerides: 56 mg/dL (ref <150)
VLDL: 11 mg/dL (ref 0–40)

## 2021-02-22 LAB — HEMOGLOBIN A1C
Hgb A1c MFr Bld: 6.1 % — ABNORMAL HIGH (ref 4.8–5.6)
Mean Plasma Glucose: 128 mg/dL

## 2021-02-22 MED ORDER — HYDROCHLOROTHIAZIDE 12.5 MG PO TABS
12.5000 mg | ORAL_TABLET | Freq: Every day | ORAL | Status: DC
  Administered 2021-02-22 – 2021-02-23 (×2): 12.5 mg via ORAL
  Filled 2021-02-22 (×2): qty 1

## 2021-02-22 MED ORDER — PREDNISONE 10 MG (21) PO TBPK
10.0000 mg | ORAL_TABLET | Freq: Three times a day (TID) | ORAL | Status: DC
  Administered 2021-02-23: 08:00:00 10 mg via ORAL

## 2021-02-22 MED ORDER — PSEUDOEPHEDRINE HCL ER 120 MG PO TB12
120.0000 mg | ORAL_TABLET | Freq: Two times a day (BID) | ORAL | Status: DC
  Administered 2021-02-22 – 2021-02-23 (×2): 120 mg via ORAL
  Filled 2021-02-22 (×4): qty 1

## 2021-02-22 MED ORDER — PREDNISONE 10 MG (21) PO TBPK
20.0000 mg | ORAL_TABLET | Freq: Every morning | ORAL | Status: AC
  Administered 2021-02-22: 12:00:00 20 mg via ORAL
  Filled 2021-02-22: qty 21

## 2021-02-22 MED ORDER — GADOBUTROL 1 MMOL/ML IV SOLN
10.0000 mL | Freq: Once | INTRAVENOUS | Status: AC | PRN
  Administered 2021-02-22: 10 mL via INTRAVENOUS

## 2021-02-22 MED ORDER — DM-GUAIFENESIN ER 30-600 MG PO TB12
1.0000 | ORAL_TABLET | Freq: Two times a day (BID) | ORAL | Status: DC
  Administered 2021-02-22 – 2021-02-23 (×2): 1 via ORAL
  Filled 2021-02-22 (×3): qty 1

## 2021-02-22 MED ORDER — PREDNISONE 10 MG (21) PO TBPK
10.0000 mg | ORAL_TABLET | ORAL | Status: AC
  Administered 2021-02-22: 14:00:00 10 mg via ORAL

## 2021-02-22 MED ORDER — PREDNISONE 10 MG (21) PO TBPK
10.0000 mg | ORAL_TABLET | ORAL | Status: AC
  Administered 2021-02-22: 18:00:00 10 mg via ORAL

## 2021-02-22 MED ORDER — CELECOXIB 100 MG PO CAPS
100.0000 mg | ORAL_CAPSULE | Freq: Two times a day (BID) | ORAL | Status: DC
  Administered 2021-02-22 – 2021-02-23 (×2): 100 mg via ORAL
  Filled 2021-02-22 (×3): qty 1

## 2021-02-22 MED ORDER — PREDNISONE 10 MG (21) PO TBPK
20.0000 mg | ORAL_TABLET | Freq: Every evening | ORAL | Status: AC
  Administered 2021-02-22: 20 mg via ORAL
  Filled 2021-02-22: qty 21

## 2021-02-22 MED ORDER — PREDNISONE 10 MG (21) PO TBPK: 10.0000 mg | ORAL_TABLET | Freq: Four times a day (QID) | ORAL | Status: DC

## 2021-02-22 MED ORDER — ATORVASTATIN CALCIUM 20 MG PO TABS
40.0000 mg | ORAL_TABLET | Freq: Every day | ORAL | Status: DC
  Administered 2021-02-22 – 2021-02-23 (×2): 40 mg via ORAL
  Filled 2021-02-22 (×2): qty 2

## 2021-02-22 MED ORDER — PREDNISONE 10 MG (21) PO TBPK: 20.0000 mg | ORAL_TABLET | Freq: Every evening | ORAL | Status: DC

## 2021-02-22 MED ORDER — LOSARTAN POTASSIUM 50 MG PO TABS
50.0000 mg | ORAL_TABLET | Freq: Every day | ORAL | Status: DC
  Administered 2021-02-22 – 2021-02-23 (×2): 50 mg via ORAL
  Filled 2021-02-22 (×2): qty 1

## 2021-02-22 MED ORDER — LORATADINE 10 MG PO TABS
10.0000 mg | ORAL_TABLET | Freq: Every day | ORAL | Status: DC
  Administered 2021-02-22 – 2021-02-23 (×2): 10 mg via ORAL
  Filled 2021-02-22 (×2): qty 1

## 2021-02-22 NOTE — Progress Notes (Signed)
Pt refused to put tele back on. MD made aware

## 2021-02-22 NOTE — Progress Notes (Signed)
Nutrition Brief Note  RD consulted to assess patient's nutrition status.  Wt Readings from Last 15 Encounters:  02/22/21 98.1 kg  07/30/20 99.8 kg  01/01/20 97.5 kg  07/20/19 99.8 kg  07/09/19 99.8 kg  06/24/17 95.3 kg   Spoke with pt at bedside. Pt reports that he has a good appetite and has been eating well at home prior to coming to the hospital. Pt eating breakfast during visit, on track to complete 100% of it.  Pt reports no changes in his weight. Weight very consistent per limited weight history above.  On exam, pt with no significant depletions.  Body mass index is 27.77 kg/m.   Current diet order is Heart Healthy, patient is consuming approximately 75-100% of meals at this time. Labs and medications reviewed.   No nutrition interventions warranted at this time. If nutrition issues arise, please consult RD.   Derrel Nip, RD, LDN (she/her/hers) Clinical Inpatient Dietitian RD Pager/After-Hours/Weekend Pager # in [place]

## 2021-02-22 NOTE — Progress Notes (Signed)
PROGRESS NOTE  Jacob Velasquez    DOB: Mar 29, 1945, 76 y.o.  GHW:[id number]  PCP: Baxter Hire, MD   Code Status: DNR   DOA: 02/21/2021   LOS: 1  Brief Narrative of Current Hospitalization  Jacob Velasquez is a 76 y.o. male with a PMH significant for HTN, tobacco use, CVA. They presented from home to the ED on 02/21/2021 with diplopia x1 days. In the ED, it was found that they had left eye ptosis. For suspected CVA they underwent brain MRI and MRA which was negative for acute stroke. Neurology was consulted.  Patient endorses several days of severe sinus pressure with rhinorrhea prior to onset of the ptosis.  In addition, patient was found to be in afib on presentation which is a new diagnosis. He was not previously on anticoagulation. He is asymptomatic.  Patient was admitted to medicine service for further workup and management of diplopia and ptosis as outlined in detail below.  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 However, imaging of brain has been negative for lesions thus far. Suspect that could be potential viral inflammation of cranial nerve.  - neurology consulted, appreciate recs - steroids - URVI symptomatic relief  New onset Afib- rate controlled. - anticoagulation pending neruo recs  HTN- Bps elevated this admission - continue home medications  H/o CVA- no OT/SLP f/u. PT recommends vestibular OP therapy.  - statin therapy  DVT prophylaxis: heparin injection 5,000 Units Start: 02/21/21 2245  Diet:   Subjective 02/22/21    Pt reports no headache. Continues to have diplopia when both eyes open.   Disposition Plan & Communication  Patient status: Inpatient   Admitted From: Home Disposition: Home Anticipated discharge date: TBD  Family Communication: none  Consults, Procedures, Significant Events  Consultants:  neurology  Procedures/significant events:  MRI, MRA head and neck   Antimicrobials:  Anti-infectives (From admission, onward)    None       Objective   Vitals:   02/21/21 2042 02/21/21 2200 02/22/21 0132 02/22/21 0640  BP: (!) 165/99 (!) 171/95 110/62 (!) 162/97  Pulse: 85 67 89 89  Resp: 12  18 15   Temp:   98 F (36.7 C) 98.2 F (36.8 C)  TempSrc:   Oral Oral  SpO2: 98% 98% 96% 97%  Weight:   98.1 kg   Height:   6\' 2"  (1.88 m)    No intake or output data in the 24 hours ending 02/22/21 0702 Filed Weights   02/21/21 1757 02/22/21 0132  Weight: 97.5 kg 98.1 kg    Patient BMI: Body mass index is 27.77 kg/m.   Physical Exam:  General: awake, alert, NAD HEENT: left eyelid ptosis. Sluggish EOM decreased inferior/left gaze. Negative nystagmus. Pupil constricted.  Respiratory: normal respiratory effort. Cardiovascular: normal S1/S2, RRR,  no murmur, quick capillary refill Nervous: A&O x3. CN abnormality above Extremities: moves all equally, no edema, normal tone Skin: dry, intact, normal temperature, normal color. No rashes, lesions or ulcers on exposed skin Psychiatry: normal mood, congruent affect  Labs   I have personally reviewed following labs and imaging studies Admission on 02/21/2021  Component Date Value Ref Range Status  Prothrombin Time 02/21/2021 12.9  11.4 - 15.2 seconds Final   INR 02/21/2021 1.0  0.8 - 1.2 Final   aPTT 02/21/2021 31  24 - 36 seconds Final   WBC 02/21/2021 7.8  4.0 - 10.5 K/uL Final   RBC 02/21/2021 5.17  4.22 - 5.81 MIL/uL Final   Hemoglobin 02/21/2021 15.6  13.0 - 17.0 g/dL Final   HCT 02/21/2021 47.1  39.0 - 52.0 % Final   MCV 02/21/2021 91.1  80.0 - 100.0 fL Final   MCH 02/21/2021 30.2  26.0 - 34.0 pg Final   MCHC 02/21/2021 33.1  30.0 - 36.0 g/dL Final   RDW 02/21/2021  14.1  11.5 - 15.5 % Final   Platelets 02/21/2021 254  150 - 400 K/uL Final   nRBC 02/21/2021 0.0  0.0 - 0.2 % Final   Neutrophils Relative % 02/21/2021 67  % Final   Neutro Abs 02/21/2021 5.2  1.7 - 7.7 K/uL Final   Lymphocytes Relative 02/21/2021 22  % Final   Lymphs Abs 02/21/2021 1.7  0.7 - 4.0 K/uL Final   Monocytes Relative 02/21/2021 8  % Final   Monocytes Absolute 02/21/2021 0.6  0.1 - 1.0 K/uL Final   Eosinophils Relative 02/21/2021 2  % Final   Eosinophils Absolute 02/21/2021 0.1  0.0 - 0.5 K/uL Final   Basophils Relative 02/21/2021 1  % Final   Basophils Absolute 02/21/2021 0.1  0.0 - 0.1 K/uL Final   Immature Granulocytes 02/21/2021 0  % Final   Abs Immature Granulocytes 02/21/2021 0.02  0.00 - 0.07 K/uL Final   Sodium 02/21/2021 136  135 - 145 mmol/L Final   Potassium 02/21/2021 3.6  3.5 - 5.1 mmol/L Final   Chloride 02/21/2021 104  98 - 111 mmol/L Final   CO2 02/21/2021 24  22 - 32 mmol/L Final   Glucose, Bld 02/21/2021 139 (H)  70 - 99 mg/dL Final   BUN 02/21/2021 17  8 - 23 mg/dL Final   Creatinine, Ser 02/21/2021 1.24  0.61 - 1.24 mg/dL Final   Calcium 02/21/2021 9.0  8.9 - 10.3 mg/dL Final   Total Protein 02/21/2021 7.3  6.5 - 8.1 g/dL Final   Albumin 02/21/2021 3.8  3.5 - 5.0 g/dL Final   AST 02/21/2021 21  15 - 41 U/L Final   ALT 02/21/2021 16  0 - 44 U/L Final   Alkaline Phosphatase 02/21/2021 68  38 - 126 U/L Final   Total Bilirubin 02/21/2021 1.4 (H)  0.3 - 1.2 mg/dL Final   GFR, Estimated 02/21/2021 >60  >60 mL/min Final   Anion gap 02/21/2021 8  5 - 15 Final   Glucose-Capillary 02/21/2021 113 (H)  70 - 99 mg/dL Final   Cholesterol 02/22/2021 153  0 - 200 mg/dL Final   Triglycerides 02/22/2021 56  <150 mg/dL Final   HDL 02/22/2021 52  >40 mg/dL Final   Total CHOL/HDL Ratio 02/22/2021 2.9  RATIO Final   VLDL 02/22/2021 11  0 - 40 mg/dL Final   LDL Cholesterol 02/22/2021 90  0 - 99 mg/dL Final    Imaging Studies  CT HEAD WO CONTRAST  Result Date:  02/21/2021 CLINICAL DATA:  Seizure, new-onset, no history of trauma. Double vision. EXAM: CT HEAD WITHOUT CONTRAST TECHNIQUE: Contiguous axial images were obtained from the base of the skull through the vertex without intravenous contrast. RADIATION DOSE REDUCTION: This exam was performed according to the departmental dose-optimization program which includes automated exposure control, adjustment of the mA and/or kV according  to patient size and/or use of iterative reconstruction technique. COMPARISON:  07/30/2020 FINDINGS: Brain: There is atrophy and chronic small vessel disease changes. No acute intracranial abnormality. Specifically, no hemorrhage, hydrocephalus, mass lesion, acute infarction, or significant intracranial injury. Vascular: No hyperdense vessel or unexpected calcification. Skull: No acute calvarial abnormality. Sinuses/Orbits: No acute findings Other: None IMPRESSION: Atrophy, chronic microvascular disease. No acute intracranial abnormality. Electronically Signed   By: Rolm Baptise M.D.   On: 02/21/2021 18:39   MR ANGIO HEAD WO CONTRAST  Result Date: 02/22/2021 CLINICAL DATA:  Initial evaluation for acute neuro deficit, stroke suspected, double vision. EXAM: MRI HEAD WITHOUT CONTRAST MRA HEAD WITHOUT CONTRAST MRA NECK WITHOUT AND WITH CONTRAST TECHNIQUE: Multiplanar, multi-echo pulse sequences of the brain and surrounding structures were acquired without intravenous contrast. Angiographic images of the Circle of Willis were acquired using MRA technique without intravenous contrast. Angiographic images of the neck were acquired using MRA technique without and with intravenous contrast. Carotid stenosis measurements (when applicable) are obtained utilizing NASCET criteria, using the distal internal carotid diameter as the denominator. CONTRAST:  45mL GADAVIST GADOBUTROL 1 MMOL/ML IV SOLN COMPARISON:  Prior head CT from 02/21/2021. FINDINGS: MRI HEAD FINDINGS Brain: Diffuse prominence of the CSF  containing spaces compatible generalized cerebral atrophy. Patchy and confluent T2/FLAIR hyperintensity involving the periventricular and deep white matter both cerebral hemispheres most consistent with chronic small vessel ischemic disease, mild-to-moderate in nature. Few scattered superimposed remote lacunar infarcts present about the bilateral basal ganglia/corona radiata. Small remote lacunar infarct noted at the right dorsal pons (series 10, image 8). Few probable tiny remote cerebellar infarcts noted as well. No abnormal foci of restricted diffusion to suggest acute or subacute ischemia. Gray-white matter differentiation maintained. No encephalomalacia to suggest chronic cortical infarction. No acute or chronic intracranial blood products. No mass lesion, midline shift or mass effect no hydrocephalus or extra-axial fluid collection. Pituitary gland suprasellar region normal. Midline structures intact. Vascular: Major intracranial vascular flow voids are maintained. 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 Bone marrow signal intensity within normal limits. No scalp soft tissue abnormality. Sinuses/Orbits: Globes and orbital soft tissues demonstrate no acute finding. Paranasal sinuses are largely clear. No mastoid effusion. Inner ear structures grossly normal. Other: None. MRA HEAD FINDINGS Anterior circulation: Visualized distal cervical segments of the internal carotid arteries are patent with antegrade flow. Petrous segments patent bilaterally. Mild atheromatous irregularity seen throughout the carotid siphons without hemodynamically significant stenosis or other abnormality. A1 segments patent bilaterally. Normal anterior communicating artery complex. Anterior cerebral arteries patent without significant stenosis. No M1 stenosis or occlusion. Normal MCA bifurcations. Distal MCA branches well perfused and symmetric. Mild diffuse small vessel atheromatous  irregularity. Posterior circulation: Both vertebral arteries widely patent to the vertebrobasilar junction without stenosis. Left PICA origin patent. Right PICA not seen. Basilar patent to its distal aspect without stenosis. Superior cerebellar arteries patent bilaterally. Both PCA supplied via the basilar as well as small bowel posterior to the arteries. Short-segment moderate stenosis at the right P1/P2 junction (series 1052, image 9). Smooth moderate stenosis noted involving the distal left P2 segment (series 1058, image 4). PCAs otherwise patent to their distal aspects without stenosis. Distal small vessel atheromatous irregularity noted. Anatomic variants: None significant.  No aneurysm. MRA NECK FINDINGS Aortic arch: Visualized aortic arch normal caliber with normal branch pattern. No visible stenosis seen about the origin of the great vessels. Right carotid system: Right CCA patent from its origin to the bifurcation without  stenosis. Atheromatous irregularity about the right carotid bulb/proximal right ICA without hemodynamically significant stenosis. Right ICA patent distally without stenosis or evidence for dissection. Left carotid system: Left CCA patent from its origin to the bifurcation without stenosis. Moderate atheromatous irregularity and plaque present about the left carotid bulb/proximal left ICA with associated stenosis of up to approximately 60% by NASCET criteria (series 1095, image 4). Left ICA otherwise patent distally without stenosis or evidence for dissection. Vertebral arteries: Both vertebral arteries arise from the subclavian arteries. No proximal subclavian artery stenosis. Vertebral arteries are largely codominant widely patent without stenosis or evidence for dissection. Other: None IMPRESSION: MRI HEAD: 1. No acute intracranial abnormality. 2. Generalized cerebral atrophy with mild-to-moderate chronic small vessel ischemic disease with a few scattered remote lacunar infarcts  involving the bilateral basal ganglia/corona radiata as well as the right dorsal pons. MRA HEAD: 1. Negative intracranial MRA for large vessel occlusion. 2. Moderate short-segment stenoses involving the right P1/P2 junction and distal left P2 segment. 3. Mild diffuse small vessel atheromatous irregularity throughout the intracranial circulation. MRA NECK: 1. 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 2. No hemodynamically significant or correctable stenosis about the right carotid artery system. 3. Wide patency of both vertebral arteries within the neck. 4. No evidence for dissection or other acute vascular abnormality. Electronically Signed   By: Jeannine Boga M.D.   On: 02/22/2021 01:27   MR ANGIO NECK W WO CONTRAST  Result Date: 02/22/2021 CLINICAL DATA:  Initial evaluation for acute neuro deficit, stroke suspected, double vision. EXAM: MRI HEAD WITHOUT CONTRAST MRA HEAD WITHOUT CONTRAST MRA NECK WITHOUT AND WITH CONTRAST TECHNIQUE: Multiplanar, multi-echo pulse sequences of the brain and surrounding structures were acquired without intravenous contrast. Angiographic images of the Circle of Willis were acquired using MRA technique without intravenous contrast. Angiographic images of the neck were acquired using MRA technique without and with intravenous contrast. Carotid stenosis measurements (when applicable) are obtained utilizing NASCET criteria, using the distal internal carotid diameter as the denominator. CONTRAST:  2mL GADAVIST GADOBUTROL 1 MMOL/ML IV SOLN COMPARISON:  Prior head CT from 02/21/2021. FINDINGS: MRI HEAD FINDINGS Brain: Diffuse prominence of the CSF containing spaces compatible generalized cerebral atrophy. Patchy and confluent T2/FLAIR hyperintensity involving the periventricular and deep white matter both cerebral hemispheres most consistent with chronic small vessel ischemic disease, mild-to-moderate in nature. Few scattered  superimposed remote lacunar infarcts present about the bilateral basal ganglia/corona radiata. Small remote lacunar infarct noted at the right dorsal pons (series 10, image 8). Few probable tiny remote cerebellar infarcts noted as well. No abnormal foci of restricted diffusion to suggest acute or subacute ischemia. Gray-white matter differentiation maintained. No encephalomalacia to suggest chronic cortical infarction. No acute or chronic intracranial blood products. No mass lesion, midline shift or mass effect no hydrocephalus or extra-axial fluid collection. Pituitary gland suprasellar region normal. Midline structures intact. Vascular: Major intracranial vascular flow voids are maintained. 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 Bone marrow signal intensity within normal limits. No scalp soft tissue abnormality. Sinuses/Orbits: Globes and orbital soft tissues demonstrate no acute finding. Paranasal sinuses are largely clear. No mastoid effusion. Inner ear structures grossly normal. Other: None. MRA HEAD FINDINGS Anterior circulation: Visualized distal cervical segments of the internal carotid arteries are patent with antegrade flow. Petrous segments patent bilaterally. Mild atheromatous irregularity seen throughout the carotid siphons without hemodynamically significant stenosis or other abnormality. A1 segments patent bilaterally. Normal anterior communicating  artery complex. Anterior cerebral arteries patent without significant stenosis. No M1 stenosis or occlusion. Normal MCA bifurcations. Distal MCA branches well perfused and symmetric. Mild diffuse small vessel atheromatous irregularity. Posterior circulation: Both vertebral arteries widely patent to the vertebrobasilar junction without stenosis. Left PICA origin patent. Right PICA not seen. Basilar patent to its distal aspect without stenosis. Superior cerebellar arteries patent bilaterally. Both PCA  supplied via the basilar as well as small bowel posterior to the arteries. Short-segment moderate stenosis at the right P1/P2 junction (series 1052, image 9). Smooth moderate stenosis noted involving the distal left P2 segment (series 1058, image 4). PCAs otherwise patent to their distal aspects without stenosis. Distal small vessel atheromatous irregularity noted. Anatomic variants: None significant.  No aneurysm. MRA NECK FINDINGS Aortic arch: Visualized aortic arch normal caliber with normal branch pattern. No visible stenosis seen about the origin of the great vessels. Right carotid system: Right CCA patent from its origin to the bifurcation without stenosis. Atheromatous irregularity about the right carotid bulb/proximal right ICA without hemodynamically significant stenosis. Right ICA patent distally without stenosis or evidence for dissection. Left carotid system: Left CCA patent from its origin to the bifurcation without stenosis. Moderate atheromatous irregularity and plaque present about the left carotid bulb/proximal left ICA with associated stenosis of up to approximately 60% by NASCET criteria (series 1095, image 4). Left ICA otherwise patent distally without stenosis or evidence for dissection. Vertebral arteries: Both vertebral arteries arise from the subclavian arteries. No proximal subclavian artery stenosis. Vertebral arteries are largely codominant widely patent without stenosis or evidence for dissection. Other: None IMPRESSION: MRI HEAD: 1. No acute intracranial abnormality. 2. Generalized cerebral atrophy with mild-to-moderate chronic small vessel ischemic disease with a few scattered remote lacunar infarcts involving the bilateral basal ganglia/corona radiata as well as the right dorsal pons. MRA HEAD: 1. Negative intracranial MRA for large vessel occlusion. 2. Moderate short-segment stenoses involving the right P1/P2 junction and distal left P2 segment. 3. Mild diffuse small vessel  atheromatous irregularity throughout the intracranial circulation. MRA NECK: 1. 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 2. No hemodynamically significant or correctable stenosis about the right carotid artery system. 3. Wide patency of both vertebral arteries within the neck. 4. No evidence for dissection or other acute vascular abnormality. Electronically Signed   By: Jeannine Boga M.D.   On: 02/22/2021 01:27   MR BRAIN WO CONTRAST  Result Date: 02/22/2021 CLINICAL DATA:  Initial evaluation for acute neuro deficit, stroke suspected, double vision. EXAM: MRI HEAD WITHOUT CONTRAST MRA HEAD WITHOUT CONTRAST MRA NECK WITHOUT AND WITH CONTRAST TECHNIQUE: Multiplanar, multi-echo pulse sequences of the brain and surrounding structures were acquired without intravenous contrast. Angiographic images of the Circle of Willis were acquired using MRA technique without intravenous contrast. Angiographic images of the neck were acquired using MRA technique without and with intravenous contrast. Carotid stenosis measurements (when applicable) are obtained utilizing NASCET criteria, using the distal internal carotid diameter as the denominator. CONTRAST:  82mL GADAVIST GADOBUTROL 1 MMOL/ML IV SOLN COMPARISON:  Prior head CT from 02/21/2021. FINDINGS: MRI HEAD FINDINGS Brain: Diffuse prominence of the CSF containing spaces compatible generalized cerebral atrophy. Patchy and confluent T2/FLAIR hyperintensity involving the periventricular and deep white matter both cerebral hemispheres most consistent with chronic small vessel ischemic disease, mild-to-moderate in nature. Few scattered superimposed remote lacunar infarcts present about the bilateral basal ganglia/corona radiata. Small remote lacunar infarct noted at the right dorsal pons (series 10, image 8). Few  probable tiny remote cerebellar infarcts noted as well. No abnormal foci of restricted diffusion to suggest acute or  subacute ischemia. Gray-white matter differentiation maintained. No encephalomalacia to suggest chronic cortical infarction. No acute or chronic intracranial blood products. No mass lesion, midline shift or mass effect no hydrocephalus or extra-axial fluid collection. Pituitary gland suprasellar region normal. Midline structures intact. Vascular: Major intracranial vascular flow voids are maintained. 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 Bone marrow signal intensity within normal limits. No scalp soft tissue abnormality. Sinuses/Orbits: Globes and orbital soft tissues demonstrate no acute finding. Paranasal sinuses are largely clear. No mastoid effusion. Inner ear structures grossly normal. Other: None. MRA HEAD FINDINGS Anterior circulation: Visualized distal cervical segments of the internal carotid arteries are patent with antegrade flow. Petrous segments patent bilaterally. Mild atheromatous irregularity seen throughout the carotid siphons without hemodynamically significant stenosis or other abnormality. A1 segments patent bilaterally. Normal anterior communicating artery complex. Anterior cerebral arteries patent without significant stenosis. No M1 stenosis or occlusion. Normal MCA bifurcations. Distal MCA branches well perfused and symmetric. Mild diffuse small vessel atheromatous irregularity. Posterior circulation: Both vertebral arteries widely patent to the vertebrobasilar junction without stenosis. Left PICA origin patent. Right PICA not seen. Basilar patent to its distal aspect without stenosis. Superior cerebellar arteries patent bilaterally. Both PCA supplied via the basilar as well as small bowel posterior to the arteries. Short-segment moderate stenosis at the right P1/P2 junction (series 1052, image 9). Smooth moderate stenosis noted involving the distal left P2 segment (series 1058, image 4). PCAs otherwise patent to their distal aspects  without stenosis. Distal small vessel atheromatous irregularity noted. Anatomic variants: None significant.  No aneurysm. MRA NECK FINDINGS Aortic arch: Visualized aortic arch normal caliber with normal branch pattern. No visible stenosis seen about the origin of the great vessels. Right carotid system: Right CCA patent from its origin to the bifurcation without stenosis. Atheromatous irregularity about the right carotid bulb/proximal right ICA without hemodynamically significant stenosis. Right ICA patent distally without stenosis or evidence for dissection. Left carotid system: Left CCA patent from its origin to the bifurcation without stenosis. Moderate atheromatous irregularity and plaque present about the left carotid bulb/proximal left ICA with associated stenosis of up to approximately 60% by NASCET criteria (series 1095, image 4). Left ICA otherwise patent distally without stenosis or evidence for dissection. Vertebral arteries: Both vertebral arteries arise from the subclavian arteries. No proximal subclavian artery stenosis. Vertebral arteries are largely codominant widely patent without stenosis or evidence for dissection. Other: None IMPRESSION: MRI HEAD: 1. No acute intracranial abnormality. 2. Generalized cerebral atrophy with mild-to-moderate chronic small vessel ischemic disease with a few scattered remote lacunar infarcts involving the bilateral basal ganglia/corona radiata as well as the right dorsal pons. MRA HEAD: 1. Negative intracranial MRA for large vessel occlusion. 2. Moderate short-segment stenoses involving the right P1/P2 junction and distal left P2 segment. 3. Mild diffuse small vessel atheromatous irregularity throughout the intracranial circulation. MRA NECK: 1. 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 2. No hemodynamically significant or correctable stenosis about the right carotid artery system. 3. Wide patency of both vertebral arteries  within the neck. 4. No evidence for dissection or other acute vascular abnormality. Electronically Signed   By: Jeannine Boga M.D.   On: 02/22/2021 01:27    Medications   Scheduled Meds:   stroke: mapping our early stages of recovery book   Does not apply Once  aspirin EC  81 mg Oral Daily   clopidogrel  75 mg Oral Daily   heparin  5,000 Units Subcutaneous Q12H   No recently discontinued medications to reconcile  LOS: 1 day   Richarda Osmond, DO [facility] 02/22/2021, 7:02 AM   Available by Epic secure chat 7AM-7PM. If 7PM-7AM, please contact night-coverage Refer to [email or web address] to contact the [facility] Attending or Consulting provider for this pt

## 2021-02-22 NOTE — Evaluation (Signed)
Speech Language Pathology Evaluation Patient Details Name: Jacob Velasquez MRN: [id number] DOB: Mar 01, 1945 Today's Date: 02/22/2021 Time: 3329-5188 SLP Time Calculation (min) (ACUTE ONLY): 25 min  Problem List:  Patient Active Problem List   Diagnosis Date Noted   CVA, old, cognitive deficits 02/21/2021   Diplopia 02/21/2021   AF (paroxysmal atrial fibrillation) ([place]) 02/21/2021   Closed wedge compression fracture of T9 vertebra ([place]) 07/29/2020   History of tobacco abuse 07/29/2020   Paresthesia    TIA (transient ischemic attack) 01/01/2020   Essential hypertension 10/10/2018   Past Medical History:  Past Medical History:  Diagnosis Date   Hypertension    TIA (transient ischemic attack)    Past Surgical History:  Past Surgical History:  Procedure Laterality Date   SPINAL FUSION N/A 07/30/2020   Procedure: FUSION POSTERIOR SPINAL MULTILEVEL Posterior Thoracic Fusion T7-T11;  Surgeon: Meade Maw, MD;  Location: [facility] ORS;  Service: Neurosurgery;  Laterality: N/A;   HPI:  Per [id number] H&P "Jacob Velasquez is a 76 y.o. male seen in ed with complaints of double vision since waking up today.  It has been going on all throughout the day consistently and now he clearly sees 2 items or 2 people standing side to side horizontally.  And double vision resolves when he covers either of his eyes.  No pain no tenderness no redness no discharge no trauma patient does have a history of stroke in the past long time ago and currently is taking his aspirin blood pressure and cholesterol medications.  Patient is not aware of his A. fib and has never been told of having atrial fibrillation.  Patient does not report any blurred vision speech issues gait issues no chest pain shortness of breath no fevers chills bowel movement changes or urinary changes.     Pt has past medical history of hypertension, dyslipidemia, history of stroke"   Testing completed 02/22/21 MRI HEAD:   1. No acute  intracranial abnormality. 2. Generalized cerebral atrophy with mild-to-moderate chronic small vessel ischemic disease with a few scattered remote lacunar infarcts involving the bilateral basal ganglia/corona radiata as well as the right dorsal pons.   MRA HEAD:   1. Negative intracranial MRA for large vessel occlusion. 2. Moderate short-segment stenoses involving the right P1/P2 junction and distal left P2 segment. 3. Mild diffuse small vessel atheromatous irregularity throughout the intracranial circulation.   MRA NECK:   1. 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 2. No hemodynamically significant or correctable stenosis about the right carotid artery system. 3. Wide patency of both vertebral arteries within the neck. 4. No evidence for dissection or other acute vascular abnormality.     Assessment / Plan / Recommendation Clinical Impression  Pt seen for cognitive-linguistic evaluation. Pt alert, pleasant, and cooperative. Endorses stacked double vision. L eye ptosis and incoordinated/asymmetrical eye movement appreciated. Pt noted vision improved when one eye is covered.   Evaluation completed via informal means. Pt demonstrated functional primary speech/language ability. Basic cognitive-linguistic ability was [facility]. Pt's speech is fluent, appropriate, and without s/sx dysarthria or anomia. Pt denies changes to speech/language or cognition. Pt likely at cognitive-linguistic baseline.  No further SLP services recommended at this time. SLP to sign off.   Pt made aware and in agreement with results, recommendations, and SLP POC.     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  Recommendations may be updated based on patient status, additional functional criteria and insurance authorization.    Follow Up Recommendations  No SLP follow up    Assistance Recommended at Discharge  PRN - due to visual changes  Functional Status Assessment Patient has not had a recent decline in their functional status        SLP Evaluation Cognition  Overall Cognitive Status: Within Functional Limits for tasks assessed Arousal/Alertness: Awake/alert Orientation Level: Oriented X4 Executive Function: Reasoning Reasoning: Appears intact Safety/Judgment: Appears intact       Comprehension  Auditory Comprehension Overall Auditory Comprehension: Appears within functional limits for tasks assessed Yes/No Questions: Within Functional Limits Commands: Within Functional Limits Conversation: Complex [facility]) Reading Comprehension Reading Status: Not tested (due to visual changes)    Expression Expression Primary Mode of Expression: Verbal Verbal Expression Overall Verbal Expression: Appears within functional limits for tasks assessed Initiation: No impairment Automatic Speech:  (WFL) Level of Generative/Spontaneous Verbalization: Conversation (WFL) Repetition: No impairment Naming: No impairment Pragmatics: No impairment Written Expression Dominant Hand: Right Written Expression: Not tested   Oral / Motor  Oral Motor/Sensory Function Overall Oral Motor/Sensory Function: Within functional limits Motor Speech Overall Motor Speech: Appears within functional limits for tasks assessed Respiration: Within functional limits Phonation: Normal Resonance: Within functional limits Articulation: Within functional limitis Intelligibility: Intelligible Motor Planning: Witnin functional limits           Cherrie Gauze, M.S., [facility] [phone] ([place])   Quintella Baton 02/22/2021, 12:41 PM

## 2021-02-22 NOTE — Consult Note (Signed)
NEURO HOSPITALIST CONSULT NOTE   Requestig physician: Dr. Ouida Sills  Reason for Consult: Acute onset of double vision with ptosis and impaired mobility of the left eye  History obtained from:   Patient and Chart     HPI:                                                                                                                                          Jacob Velasquez is an 76 y.o. male who with a PMHx of HTN and TIA who presented to the ED on Sunday evening with a c/c double visoin since awakening Sunday morning. The double vision was noted to be binocular, resolving if either eye was closed.  He denied recent injury. He did complain of sinusitis symptoms for the past 2-3 weeks with pain in the bilateral orbitofrontal region as well as in back of the nasal bridge that has improved during this admission with medication. He states that yesterday he did have some pain with left eye movement, but that this component of his pain has since resolved. No orbital swelling or scleral injection has been noted on initial evaluations.   Past Medical History:  Diagnosis Date   Hypertension    TIA (transient ischemic attack)     Past Surgical History:  Procedure Laterality Date   SPINAL FUSION N/A 07/30/2020   Procedure: FUSION POSTERIOR SPINAL MULTILEVEL Posterior Thoracic Fusion T7-T11;  Surgeon: Meade Maw, MD;  Location: [facility] ORS;  Service: Neurosurgery;  Laterality: N/A;    History reviewed. No pertinent family history.            Social History:  reports that he has been smoking cigarettes. He has never used smokeless tobacco. He reports that he does not drink alcohol and does not use drugs.  No Known Allergies  MEDICATIONS:                                                                                                                     Prior to Admission:  Medications Prior to Admission  Medication Sig Dispense Refill Last Dose   aspirin 81 MG EC tablet  Take 1 tablet (81 mg total) by mouth daily. Swallow whole. 30 tablet 11    atorvastatin (LIPITOR) 40 MG tablet Take 1  tablet (40 mg total) by mouth daily. 30 tablet 0    atorvastatin (LIPITOR) 40 MG tablet Take 40 mg by mouth daily.      celecoxib (CELEBREX) 100 MG capsule Take 100 mg by mouth 2 (two) times daily.      hydrochlorothiazide (HYDRODIURIL) 12.5 MG tablet Take 12.5 mg by mouth daily.      losartan (COZAAR) 50 MG tablet Take 50 mg by mouth daily.      Scheduled:   stroke: mapping our early stages of recovery book   Does not apply Once   aspirin EC  81 mg Oral Daily   atorvastatin  40 mg Oral Daily   celecoxib  100 mg Oral BID   clopidogrel  75 mg Oral Daily   dextromethorphan-guaiFENesin  1 tablet Oral BID   heparin  5,000 Units Subcutaneous Q12H   hydrochlorothiazide  12.5 mg Oral Daily   loratadine  10 mg Oral Daily   losartan  50 mg Oral Daily   [START ON 02/23/2021] predniSONE  10 mg Oral 3 x daily with food   [START ON 02/24/2021] predniSONE  10 mg Oral 4X daily taper   predniSONE  20 mg Oral Nightly   [START ON 02/23/2021] predniSONE  20 mg Oral Nightly   pseudoephedrine  120 mg Oral BID    ROS:                                                                                                                                       Does not endorse any additional symptoms, including ataxia, sensory loss, facial droop, aphasia, blurred vision or limb weakness. Other ROS as per HPI.    Blood pressure (!) 162/108, pulse 88, temperature 98.5 F (36.9 C), resp. rate 18, height 6\' 2"  (1.88 m), weight 98.1 kg, SpO2 96 %.   General Examination:                                                                                                       Physical Exam  HEENT-  [place]/AT. No orbital ecchymosis, swelling or scleral injection.    Lungs- Respirations unlabored Extremities- Warm and well perfused  Neurological Examination Mental Status:  Awake, alert and oriented. Speech  fluent without evidence of aphasia.  Able to follow all commands without difficulty. Cranial Nerves: II: Visual fields intact bilaterally in all 4 quadrants. Visual acuity 20/25 bilaterally. Pupils 3 mm, round and equally reactive.   III,IV, VI: Prominent left ptosis. Left eye  with inability to supraduct, infraduct or medially rotate past the midline, with intact abduction. Right eye EOMI without ptosis.  V,VII: Smile symmetric, facial temp sensation normal bilaterally VIII: Hearing intact to voice IX,X: No hypophonia or hoarseness XI: Symmetric XII: Midline tongue extension Motor: Right : Upper extremity   5/5    Left:     Upper extremity   5/5  Lower extremity   5/5     Lower extremity   5/5 Normal tone throughout; no atrophy noted Sensory: Temp and light touch intact to BUE without extinction to DSS. BLE with intact FT bilaterally.  Deep Tendon Reflexes: 2+ and symmetric throughout Cerebellar: No ataxia with FNF bilaterally. Mild action tremor on the right.  Gait: Normal gait and station   Lab Results: Basic Metabolic Panel: Recent Labs  Lab 02/21/21 1813  NA 136  K 3.6  CL 104  CO2 24  GLUCOSE 139*  BUN 17  CREATININE 1.24  CALCIUM 9.0    CBC: Recent Labs  Lab 02/21/21 1813  WBC 7.8  NEUTROABS 5.2  HGB 15.6  HCT 47.1  MCV 91.1  PLT 254    Cardiac Enzymes: No results for input(s): CKTOTAL, CKMB, CKMBINDEX, TROPONINI in the last 168 hours.  Lipid Panel: Recent Labs  Lab 02/22/21 0439  CHOL 153  TRIG 56  HDL 52  CHOLHDL 2.9  VLDL 11  LDLCALC 90    Imaging: CT HEAD WO CONTRAST  Result Date: 02/21/2021 CLINICAL DATA:  Seizure, new-onset, no history of trauma. Double vision. EXAM: CT HEAD WITHOUT CONTRAST TECHNIQUE: Contiguous axial images were obtained from the base of the skull through the vertex without intravenous contrast. RADIATION DOSE REDUCTION: This exam was performed according to the departmental dose-optimization program which includes  automated exposure control, adjustment of the mA and/or kV according to patient size and/or use of iterative reconstruction technique. COMPARISON:  07/30/2020 FINDINGS: Brain: There is atrophy and chronic small vessel disease changes. No acute intracranial abnormality. Specifically, no hemorrhage, hydrocephalus, mass lesion, acute infarction, or significant intracranial injury. Vascular: No hyperdense vessel or unexpected calcification. Skull: No acute calvarial abnormality. Sinuses/Orbits: No acute findings Other: None IMPRESSION: Atrophy, chronic microvascular disease. No acute intracranial abnormality. Electronically Signed   By: Rolm Baptise M.D.   On: 02/21/2021 18:39   MR ANGIO HEAD WO CONTRAST  Result Date: 02/22/2021 CLINICAL DATA:  Initial evaluation for acute neuro deficit, stroke suspected, double vision. EXAM: MRI HEAD WITHOUT CONTRAST MRA HEAD WITHOUT CONTRAST MRA NECK WITHOUT AND WITH CONTRAST TECHNIQUE: Multiplanar, multi-echo pulse sequences of the brain and surrounding structures were acquired without intravenous contrast. Angiographic images of the Circle of Willis were acquired using MRA technique without intravenous contrast. Angiographic images of the neck were acquired using MRA technique without and with intravenous contrast. Carotid stenosis measurements (when applicable) are obtained utilizing NASCET criteria, using the distal internal carotid diameter as the denominator. CONTRAST:  69mL GADAVIST GADOBUTROL 1 MMOL/ML IV SOLN COMPARISON:  Prior head CT from 02/21/2021. FINDINGS: MRI HEAD FINDINGS Brain: Diffuse prominence of the CSF containing spaces compatible generalized cerebral atrophy. Patchy and confluent T2/FLAIR hyperintensity involving the periventricular and deep white matter both cerebral hemispheres most consistent with chronic small vessel ischemic disease, mild-to-moderate in nature. Few scattered superimposed remote lacunar infarcts present about the bilateral basal  ganglia/corona radiata. Small remote lacunar infarct noted at the right dorsal pons (series 10, image 8). Few probable tiny remote cerebellar infarcts noted as well. No abnormal foci of restricted diffusion to suggest acute or  subacute ischemia. Gray-white matter differentiation maintained. No encephalomalacia to suggest chronic cortical infarction. No acute or chronic intracranial blood products. No mass lesion, midline shift or mass effect no hydrocephalus or extra-axial fluid collection. Pituitary gland suprasellar region normal. Midline structures intact. Vascular: Major intracranial vascular flow voids are maintained. 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 Bone marrow signal intensity within normal limits. No scalp soft tissue abnormality. Sinuses/Orbits: Globes and orbital soft tissues demonstrate no acute finding. Paranasal sinuses are largely clear. No mastoid effusion. Inner ear structures grossly normal. Other: None. MRA HEAD FINDINGS Anterior circulation: Visualized distal cervical segments of the internal carotid arteries are patent with antegrade flow. Petrous segments patent bilaterally. Mild atheromatous irregularity seen throughout the carotid siphons without hemodynamically significant stenosis or other abnormality. A1 segments patent bilaterally. Normal anterior communicating artery complex. Anterior cerebral arteries patent without significant stenosis. No M1 stenosis or occlusion. Normal MCA bifurcations. Distal MCA branches well perfused and symmetric. Mild diffuse small vessel atheromatous irregularity. Posterior circulation: Both vertebral arteries widely patent to the vertebrobasilar junction without stenosis. Left PICA origin patent. Right PICA not seen. Basilar patent to its distal aspect without stenosis. Superior cerebellar arteries patent bilaterally. Both PCA supplied via the basilar as well as small bowel posterior to the  arteries. Short-segment moderate stenosis at the right P1/P2 junction (series 1052, image 9). Smooth moderate stenosis noted involving the distal left P2 segment (series 1058, image 4). PCAs otherwise patent to their distal aspects without stenosis. Distal small vessel atheromatous irregularity noted. Anatomic variants: None significant.  No aneurysm. MRA NECK FINDINGS Aortic arch: Visualized aortic arch normal caliber with normal branch pattern. No visible stenosis seen about the origin of the great vessels. Right carotid system: Right CCA patent from its origin to the bifurcation without stenosis. Atheromatous irregularity about the right carotid bulb/proximal right ICA without hemodynamically significant stenosis. Right ICA patent distally without stenosis or evidence for dissection. Left carotid system: Left CCA patent from its origin to the bifurcation without stenosis. Moderate atheromatous irregularity and plaque present about the left carotid bulb/proximal left ICA with associated stenosis of up to approximately 60% by NASCET criteria (series 1095, image 4). Left ICA otherwise patent distally without stenosis or evidence for dissection. Vertebral arteries: Both vertebral arteries arise from the subclavian arteries. No proximal subclavian artery stenosis. Vertebral arteries are largely codominant widely patent without stenosis or evidence for dissection. Other: None IMPRESSION: MRI HEAD: 1. No acute intracranial abnormality. 2. Generalized cerebral atrophy with mild-to-moderate chronic small vessel ischemic disease with a few scattered remote lacunar infarcts involving the bilateral basal ganglia/corona radiata as well as the right dorsal pons. MRA HEAD: 1. Negative intracranial MRA for large vessel occlusion. 2. Moderate short-segment stenoses involving the right P1/P2 junction and distal left P2 segment. 3. Mild diffuse small vessel atheromatous irregularity throughout the intracranial circulation. MRA  NECK: 1. 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 2. No hemodynamically significant or correctable stenosis about the right carotid artery system. 3. Wide patency of both vertebral arteries within the neck. 4. No evidence for dissection or other acute vascular abnormality. Electronically Signed   By: Jeannine Boga M.D.   On: 02/22/2021 01:27   MR ANGIO NECK W WO CONTRAST  Result Date: 02/22/2021 CLINICAL DATA:  Initial evaluation for acute neuro deficit, stroke suspected, double vision. EXAM: MRI HEAD WITHOUT CONTRAST MRA HEAD WITHOUT CONTRAST MRA NECK WITHOUT AND WITH CONTRAST TECHNIQUE: Multiplanar, multi-echo pulse  sequences of the brain and surrounding structures were acquired without intravenous contrast. Angiographic images of the Circle of Willis were acquired using MRA technique without intravenous contrast. Angiographic images of the neck were acquired using MRA technique without and with intravenous contrast. Carotid stenosis measurements (when applicable) are obtained utilizing NASCET criteria, using the distal internal carotid diameter as the denominator. CONTRAST:  78mL GADAVIST GADOBUTROL 1 MMOL/ML IV SOLN COMPARISON:  Prior head CT from 02/21/2021. FINDINGS: MRI HEAD FINDINGS Brain: Diffuse prominence of the CSF containing spaces compatible generalized cerebral atrophy. Patchy and confluent T2/FLAIR hyperintensity involving the periventricular and deep white matter both cerebral hemispheres most consistent with chronic small vessel ischemic disease, mild-to-moderate in nature. Few scattered superimposed remote lacunar infarcts present about the bilateral basal ganglia/corona radiata. Small remote lacunar infarct noted at the right dorsal pons (series 10, image 8). Few probable tiny remote cerebellar infarcts noted as well. No abnormal foci of restricted diffusion to suggest acute or subacute ischemia. Gray-white matter differentiation  maintained. No encephalomalacia to suggest chronic cortical infarction. No acute or chronic intracranial blood products. No mass lesion, midline shift or mass effect no hydrocephalus or extra-axial fluid collection. Pituitary gland suprasellar region normal. Midline structures intact. Vascular: Major intracranial vascular flow voids are maintained. 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 Bone marrow signal intensity within normal limits. No scalp soft tissue abnormality. Sinuses/Orbits: Globes and orbital soft tissues demonstrate no acute finding. Paranasal sinuses are largely clear. No mastoid effusion. Inner ear structures grossly normal. Other: None. MRA HEAD FINDINGS Anterior circulation: Visualized distal cervical segments of the internal carotid arteries are patent with antegrade flow. Petrous segments patent bilaterally. Mild atheromatous irregularity seen throughout the carotid siphons without hemodynamically significant stenosis or other abnormality. A1 segments patent bilaterally. Normal anterior communicating artery complex. Anterior cerebral arteries patent without significant stenosis. No M1 stenosis or occlusion. Normal MCA bifurcations. Distal MCA branches well perfused and symmetric. Mild diffuse small vessel atheromatous irregularity. Posterior circulation: Both vertebral arteries widely patent to the vertebrobasilar junction without stenosis. Left PICA origin patent. Right PICA not seen. Basilar patent to its distal aspect without stenosis. Superior cerebellar arteries patent bilaterally. Both PCA supplied via the basilar as well as small bowel posterior to the arteries. Short-segment moderate stenosis at the right P1/P2 junction (series 1052, image 9). Smooth moderate stenosis noted involving the distal left P2 segment (series 1058, image 4). PCAs otherwise patent to their distal aspects without stenosis. Distal small vessel atheromatous  irregularity noted. Anatomic variants: None significant.  No aneurysm. MRA NECK FINDINGS Aortic arch: Visualized aortic arch normal caliber with normal branch pattern. No visible stenosis seen about the origin of the great vessels. Right carotid system: Right CCA patent from its origin to the bifurcation without stenosis. Atheromatous irregularity about the right carotid bulb/proximal right ICA without hemodynamically significant stenosis. Right ICA patent distally without stenosis or evidence for dissection. Left carotid system: Left CCA patent from its origin to the bifurcation without stenosis. Moderate atheromatous irregularity and plaque present about the left carotid bulb/proximal left ICA with associated stenosis of up to approximately 60% by NASCET criteria (series 1095, image 4). Left ICA otherwise patent distally without stenosis or evidence for dissection. Vertebral arteries: Both vertebral arteries arise from the subclavian arteries. No proximal subclavian artery stenosis. Vertebral arteries are largely codominant widely patent without stenosis or evidence for dissection. Other: None IMPRESSION: MRI HEAD: 1. No acute intracranial abnormality. 2. Generalized cerebral atrophy with mild-to-moderate chronic small  vessel ischemic disease with a few scattered remote lacunar infarcts involving the bilateral basal ganglia/corona radiata as well as the right dorsal pons. MRA HEAD: 1. Negative intracranial MRA for large vessel occlusion. 2. Moderate short-segment stenoses involving the right P1/P2 junction and distal left P2 segment. 3. Mild diffuse small vessel atheromatous irregularity throughout the intracranial circulation. MRA NECK: 1. 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 2. No hemodynamically significant or correctable stenosis about the right carotid artery system. 3. Wide patency of both vertebral arteries within the neck. 4. No evidence for dissection or  other acute vascular abnormality. Electronically Signed   By: Jeannine Boga M.D.   On: 02/22/2021 01:27   MR BRAIN WO CONTRAST  Result Date: 02/22/2021 CLINICAL DATA:  Initial evaluation for acute neuro deficit, stroke suspected, double vision. EXAM: MRI HEAD WITHOUT CONTRAST MRA HEAD WITHOUT CONTRAST MRA NECK WITHOUT AND WITH CONTRAST TECHNIQUE: Multiplanar, multi-echo pulse sequences of the brain and surrounding structures were acquired without intravenous contrast. Angiographic images of the Circle of Willis were acquired using MRA technique without intravenous contrast. Angiographic images of the neck were acquired using MRA technique without and with intravenous contrast. Carotid stenosis measurements (when applicable) are obtained utilizing NASCET criteria, using the distal internal carotid diameter as the denominator. CONTRAST:  20mL GADAVIST GADOBUTROL 1 MMOL/ML IV SOLN COMPARISON:  Prior head CT from 02/21/2021. FINDINGS: MRI HEAD FINDINGS Brain: Diffuse prominence of the CSF containing spaces compatible generalized cerebral atrophy. Patchy and confluent T2/FLAIR hyperintensity involving the periventricular and deep white matter both cerebral hemispheres most consistent with chronic small vessel ischemic disease, mild-to-moderate in nature. Few scattered superimposed remote lacunar infarcts present about the bilateral basal ganglia/corona radiata. Small remote lacunar infarct noted at the right dorsal pons (series 10, image 8). Few probable tiny remote cerebellar infarcts noted as well. No abnormal foci of restricted diffusion to suggest acute or subacute ischemia. Gray-white matter differentiation maintained. No encephalomalacia to suggest chronic cortical infarction. No acute or chronic intracranial blood products. No mass lesion, midline shift or mass effect no hydrocephalus or extra-axial fluid collection. Pituitary gland suprasellar region normal. Midline structures intact. Vascular: Major  intracranial vascular flow voids are maintained. 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 Bone marrow signal intensity within normal limits. No scalp soft tissue abnormality. Sinuses/Orbits: Globes and orbital soft tissues demonstrate no acute finding. Paranasal sinuses are largely clear. No mastoid effusion. Inner ear structures grossly normal. Other: None. MRA HEAD FINDINGS Anterior circulation: Visualized distal cervical segments of the internal carotid arteries are patent with antegrade flow. Petrous segments patent bilaterally. Mild atheromatous irregularity seen throughout the carotid siphons without hemodynamically significant stenosis or other abnormality. A1 segments patent bilaterally. Normal anterior communicating artery complex. Anterior cerebral arteries patent without significant stenosis. No M1 stenosis or occlusion. Normal MCA bifurcations. Distal MCA branches well perfused and symmetric. Mild diffuse small vessel atheromatous irregularity. Posterior circulation: Both vertebral arteries widely patent to the vertebrobasilar junction without stenosis. Left PICA origin patent. Right PICA not seen. Basilar patent to its distal aspect without stenosis. Superior cerebellar arteries patent bilaterally. Both PCA supplied via the basilar as well as small bowel posterior to the arteries. Short-segment moderate stenosis at the right P1/P2 junction (series 1052, image 9). Smooth moderate stenosis noted involving the distal left P2 segment (series 1058, image 4). PCAs otherwise patent to their distal aspects without stenosis. Distal small vessel atheromatous irregularity noted. Anatomic variants: None significant.  No aneurysm. MRA NECK FINDINGS Aortic arch: Visualized aortic arch normal caliber with normal branch pattern. No visible stenosis seen about the origin of the great vessels. Right carotid system: Right CCA patent from its origin to the bifurcation  without stenosis. Atheromatous irregularity about the right carotid bulb/proximal right ICA without hemodynamically significant stenosis. Right ICA patent distally without stenosis or evidence for dissection. Left carotid system: Left CCA patent from its origin to the bifurcation without stenosis. Moderate atheromatous irregularity and plaque present about the left carotid bulb/proximal left ICA with associated stenosis of up to approximately 60% by NASCET criteria (series 1095, image 4). Left ICA otherwise patent distally without stenosis or evidence for dissection. Vertebral arteries: Both vertebral arteries arise from the subclavian arteries. No proximal subclavian artery stenosis. Vertebral arteries are largely codominant widely patent without stenosis or evidence for dissection. Other: None IMPRESSION: MRI HEAD: 1. No acute intracranial abnormality. 2. Generalized cerebral atrophy with mild-to-moderate chronic small vessel ischemic disease with a few scattered remote lacunar infarcts involving the bilateral basal ganglia/corona radiata as well as the right dorsal pons. MRA HEAD: 1. Negative intracranial MRA for large vessel occlusion. 2. Moderate short-segment stenoses involving the right P1/P2 junction and distal left P2 segment. 3. Mild diffuse small vessel atheromatous irregularity throughout the intracranial circulation. MRA NECK: 1. 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 2. No hemodynamically significant or correctable stenosis about the right carotid artery system. 3. Wide patency of both vertebral arteries within the neck. 4. No evidence for dissection or other acute vascular abnormality. Electronically Signed   By: Jeannine Boga M.D.   On: 02/22/2021 01:27     Assessment: 76 year old male with acute onset of double vision. 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  1. 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 Findings consist of ptosis, inability to supraduct, infraduct or medially rotate left eye past the midline, with intact abduction. No other CN findings seen. No arm or leg weakness. No sensory loss or ataxia.  2. CT head: Atrophy, chronic microvascular disease. No acute intracranial abnormality. 3. MRI brain: No acute intracranial abnormality. Generalized cerebral atrophy with mild-to-moderate chronic small vessel ischemic disease with a few scattered remote lacunar infarcts involving the bilateral basal ganglia/corona radiata as well as the right dorsal pons. 4. MRA head: Negative intracranial MRA for large vessel occlusion. Moderate short-segment stenoses involving the right P1/P2 junction and distal left P2 segment. Mild diffuse small vessel atheromatous irregularity throughout the intracranial circulation. On personal review of the images, no aneurysm is seen in the vicinity of the expected anatomical path of left CN3.  5. 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 No hemodynamically significant or correctable stenosis about the right carotid artery system. Wide patency of both vertebral arteries within the neck. No evidence for dissection or other acute vascular abnormality.  Recommendations: 1. MRI orbits with and without contrast. Include post contrast thin slices through the brainstem to assess for possible left CN3 enhancement.  2. 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  3. Will need outpatient follow up with Dr. Michel Bickers of Neuroophthalmology at [facility] to confirm the diagnosis.    Electronically signed: Dr. Kerney Elbe 02/22/2021, 10:23 PM

## 2021-02-22 NOTE — Evaluation (Signed)
Occupational Therapy Evaluation Patient Details Name: Jacob Velasquez MRN: [id number] DOB: 01-Aug-1945 Today's Date: 02/22/2021   History of Present Illness Patient is a 76 year old male who presents to [facility] ED yesterday 02/21/21 with c/o of double vision. He endorses persistent, severe, double vision that resolves completely when he covers up either of his eyes.  Denies any pain in his eyes.  No blurred vision. Patient has a PMH of a stroke on aspirin, hypertension, dyslipidemia.   Clinical Impression   Jacob Velasquez was seen for OT evaluation this date. Prior to hospital admission, pt was Independent in all aspects of ADL/IADL, including driving. Pt lives with his son in a 1 story home with threshold step to enter.   Pt currently INDEPENDENT don/doff socks in sitting and hand washing standing sink side. SUPERVISION simulated tub transfer and ~1000 ft functional mobility - trialed eye patch on L vs R eye, multiple LOBs noted with eye patch on R eye requiring CGA and use of wall or stagger steps to correct. No balance deficits noted with L eye covered. No skilled OT needs identified. All education complete, Will sign off. Please re-consult if additional OT needs arise.       Recommendations for follow up therapy are one component of a multi-disciplinary discharge planning process, led by the attending physician.  Recommendations may be updated based on patient status, additional functional criteria and insurance authorization.   Follow Up Recommendations  No OT follow up    Assistance Recommended at Discharge Set up Supervision/Assistance  Patient can return home with the following Assist for transportation;Help with stairs or ramp for entrance    Functional Status Assessment  Patient has had a recent decline in their functional status and demonstrates the ability to make significant improvements in function in a reasonable and predictable amount of time.  Equipment Recommendations  None  recommended by OT    Recommendations for Other Services       Precautions / Restrictions Precautions Precautions: Fall Restrictions Weight Bearing Restrictions: No      Mobility Bed Mobility Overal bed mobility: Independent                  Transfers Overall transfer level: Independent                        Balance Overall balance assessment: Needs assistance Sitting-balance support: No upper extremity supported, Feet supported Sitting balance-Leahy Scale: Normal     Standing balance support: No upper extremity supported, During functional activity Standing balance-Leahy Scale: Good Standing balance comment: impaired with R eye covered                           ADL either performed or assessed with clinical judgement   ADL Overall ADL's : Needs assistance/impaired                                       General ADL Comments: INDEPENDENT don/doff socks in sitting and hand washing standing sink side. SUPERVISION tub transfer and functional mobility - trialed eye patch on L vs R eye, multiple LOBs noted wiht eye patch on R eye requiring CGA and use of wall or stagger steps to correct.     Vision   Vision Assessment?: Yes Tracking/Visual Pursuits: Decreased smoothness of eye movement to LEFT superior field;Decreased smoothness  of eye movement to LEFT inferior field;Requires cues, head turns, or add eye shifts to track;Left eye does not track laterally;Left eye does not track medially Saccades: Additional eye shifts occurred during testing;Additional head turns occurred during testing;Decreased speed of saccadic movement Diplopia Assessment: Disappears with one eye closed     Perception     Praxis      Pertinent Vitals/Pain Pain Assessment Pain Assessment: No/denies pain     Hand Dominance Right   Extremity/Trunk Assessment Upper Extremity Assessment Upper Extremity Assessment: Overall WFL for tasks assessed   Lower  Extremity Assessment Lower Extremity Assessment: Overall WFL for tasks assessed   Cervical / Trunk Assessment Cervical / Trunk Assessment: Normal   Communication Communication Communication: No difficulties   Cognition Arousal/Alertness: Awake/alert Behavior During Therapy: WFL for tasks assessed/performed Overall Cognitive Status: Within Functional Limits for tasks assessed                                       General Comments  SITTING: BP 153/90, MAP 110    Exercises     Shoulder Instructions      Home Living Family/patient expects to be discharged to:: Private residence Living Arrangements: Children Available Help at Discharge: Family;Available PRN/intermittently Type of Home: House Home Access: Stairs to enter [employer] of Steps: 1 Entrance Stairs-Rails: None Home Layout: One level     Bathroom Shower/Tub: Tub/shower unit;Walk-in Psychologist, prison and probation services: Standard     Home Equipment: None      Lives With: Spouse    Prior Functioning/Environment Prior Level of Function : Independent/Modified Independent                        OT Problem List: Impaired vision/perception;Impaired balance (sitting and/or standing)         OT Goals(Current goals can be found in the care plan section) Acute Rehab OT Goals Patient Stated Goal: to go home OT Goal Formulation: With patient Time For Goal Achievement: 03/08/21 Potential to Achieve Goals: Good   AM-PAC OT "6 Clicks" Daily Activity     Outcome Measure Help from another person eating meals?: None Help from another person taking care of personal grooming?: None Help from another person toileting, which includes using toliet, bedpan, or urinal?: None Help from another person bathing (including washing, rinsing, drying)?: None Help from another person to put on and taking off regular upper body clothing?: None Help from another person to put on and taking off regular lower body  clothing?: None 6 Click Score: 24   End of Session    Activity Tolerance: Patient tolerated treatment well Patient left: in bed;with call bell/phone within reach;Other (comment) (PT in room)  OT Visit Diagnosis: Other abnormalities of gait and mobility (R26.89)                Time: 2355-7322 OT Time Calculation (min): 21 min Charges:  OT General Charges $OT Visit: 1 Visit OT Evaluation $OT Eval Low Complexity: 1 Low OT Treatments $Self Care/Home Management : 8-22 mins  Dessie Coma, M.S. OTR/L  02/22/21, 2:42 PM  ascom [phone]

## 2021-02-22 NOTE — Progress Notes (Signed)
PT asked Ch to come back later

## 2021-02-22 NOTE — Progress Notes (Signed)
Patient arrived from ED via bed at [place]. A&O X4, oriented to room, provided sandwich tray. Patient resting in bed.

## 2021-02-22 NOTE — Evaluation (Signed)
Physical Therapy Evaluation Patient Details Name: Jacob Velasquez MRN: [id number] DOB: 06/08/1945 Today's Date: 02/22/2021  History of Present Illness  Patient is a 76 year old male who presents to [facility] ED yesterday 02/21/21 with c/o of double vision. He endorses persistent, severe, double vision that resolves completely when he covers up either of his eyes.  Denies any pain in his eyes.  No blurred vision. Patient has a PMH of a stroke on aspirin, hypertension, dyslipidemia.  Clinical Impression  Patient tolerated session well and was agreeable to treatment. Upon arrival patient was sitting EOB with OT. Prior to OT leaving, PT was given update on how their session went. Overall BLE strength is WFL. Patient demonstrated Mod I for bed mobility (sit>supine), sit to stand transfers, and gait with no AD. Increased unsteadiness and left veering with horizontal head turns, however no LOB noted. Increased difficulty with descending stairs. Patient required supervision and BUE support. No diplopia noted from patient, however patient did report "something off but I cant describe it". Patient would continue to benefit from skilled physical therapy to work on higher level balance during gait and balance when descending steps. Recommend Outpatient PT upon discharge from acute hospitalization.        Recommendations for follow up therapy are one component of a multi-disciplinary discharge planning process, led by the attending physician.  Recommendations may be updated based on patient status, additional functional criteria and insurance authorization.  Follow Up Recommendations Outpatient PT (with a focus on vestibular rehab)    Assistance Recommended at Discharge PRN  Patient can return home with the following  A little help with walking and/or transfers;Help with stairs or ramp for entrance    Equipment Recommendations None recommended by PT  Recommendations for Other Services       Functional Status  Assessment Patient has had a recent decline in their functional status and demonstrates the ability to make significant improvements in function in a reasonable and predictable amount of time.     Precautions / Restrictions Precautions Precautions: Fall Restrictions Weight Bearing Restrictions: No      Mobility  Bed Mobility Overal bed mobility: Independent                  Transfers Overall transfer level: Independent                      Ambulation/Gait Ambulation/Gait assistance: Supervision Gait Distance (Feet): 250 Feet Assistive device: None Gait Pattern/deviations: WFL(Within Functional Limits) Gait velocity: decreased     General Gait Details: mild unsteadiness, with horizontal head turns vered to the L significantly, no LOB  Stairs Stairs: Yes Stairs assistance: Supervision Stair Management: Two rails Number of Stairs: 8 General stair comments: increased diffiuclty with descending steps, "everything looks wonkly, no double vision but hard to explain", no LOB noted; Patient was able to ascending steps with no rails, however requires B hand rails to descend  Wheelchair Mobility    Modified Rankin (Stroke Patients Only)       Balance Overall balance assessment: Needs assistance Sitting-balance support: No upper extremity supported, Feet supported Sitting balance-Leahy Scale: Normal Sitting balance - Comments: can reach outside BOS with no LOB   Standing balance support: No upper extremity supported, During functional activity Standing balance-Leahy Scale: Fair Standing balance comment: can reach and pick up a pen from the floor safely with no unsteadiness  Pertinent Vitals/Pain Pain Assessment Pain Assessment: No/denies pain    Home Living Family/patient expects to be discharged to:: Private residence Living Arrangements: Children Available Help at Discharge: Family;Available 24 hours/day Type  of Home: House Home Access: Stairs to enter Entrance Stairs-Rails: None Entrance Stairs-Number of Steps: 1   Home Layout: One level Home Equipment: None      Prior Function Prior Level of Function : Independent/Modified Independent                     Hand Dominance   Dominant Hand: Right    Extremity/Trunk Assessment   Upper Extremity Assessment Upper Extremity Assessment: Defer to OT evaluation    Lower Extremity Assessment Lower Extremity Assessment: Overall WFL for tasks assessed    Cervical / Trunk Assessment Cervical / Trunk Assessment: Normal  Communication   Communication: No difficulties  Cognition Arousal/Alertness: Awake/alert Behavior During Therapy: WFL for tasks assessed/performed Overall Cognitive Status: Within Functional Limits for tasks assessed                                          General Comments      Exercises Other Exercises Other Exercises: Vision: Patient was able to read dining menu without diffiuclty Other Exercises: Vision: Patient was able to call out correct number with 1 eye covered correctly for both L and R, as well as with no eyes covered Other Exercises: Patient was educated on fall risk, and role of PT   Assessment/Plan    PT Assessment Patient needs continued PT services  PT Problem List Decreased balance;Decreased coordination;Decreased safety awareness       PT Treatment Interventions Balance training;Other (comment);Functional mobility training;Gait training    PT Goals (Current goals can be found in the Care Plan section)  Acute Rehab PT Goals Patient Stated Goal: to get better and go home PT Goal Formulation: With patient Time For Goal Achievement: 03/08/21 Potential to Achieve Goals: Good    Frequency Min 2X/week     Co-evaluation               AM-PAC PT "6 Clicks" Mobility  Outcome Measure Help needed turning from your back to your side while in a flat bed without using  bedrails?: None Help needed moving from lying on your back to sitting on the side of a flat bed without using bedrails?: None Help needed moving to and from a bed to a chair (including a wheelchair)?: None Help needed standing up from a chair using your arms (e.g., wheelchair or bedside chair)?: None Help needed to walk in hospital room?: None Help needed climbing 3-5 steps with a railing? : A Little 6 Click Score: 23    End of Session Equipment Utilized During Treatment: Gait belt Activity Tolerance: Patient tolerated treatment well;No increased pain Patient left: in bed;with nursing/sitter in room Nurse Communication: Mobility status PT Visit Diagnosis: Unsteadiness on feet (R26.81);Difficulty in walking, not elsewhere classified (R26.2)    Time: 0350-0938 PT Time Calculation (min) (ACUTE ONLY): 16 min   Charges:   PT Evaluation $PT Eval Low Complexity: 1 Low PT Treatments $Therapeutic Activity: 8-22 mins        Iva Boop, PT  02/22/21. 12:32 PM

## 2021-02-23 ENCOUNTER — Inpatient Hospital Stay
Admit: 2021-02-23 | Discharge: 2021-02-23 | Disposition: A | Discharge status: Home / Self Care | Payer: Medicare Other | Attending: Internal Medicine | Admitting: Internal Medicine

## 2021-02-23 ENCOUNTER — Encounter: Payer: Self-pay | Admitting: Internal Medicine

## 2021-02-23 ENCOUNTER — Other Ambulatory Visit: Payer: Self-pay | Admitting: Student in an Organized Health Care Education/Training Program

## 2021-02-23 ENCOUNTER — Inpatient Hospital Stay: Payer: Medicare Other

## 2021-02-23 DIAGNOSIS — H4902 Third [oculomotor] nerve palsy, left eye: Secondary | ICD-10-CM

## 2021-02-23 LAB — ECHOCARDIOGRAM COMPLETE BUBBLE STUDY
AR max vel: 3.25 cm2
AV Area VTI: 4.02 cm2
AV Area mean vel: 3.63 cm2
AV Mean grad: 4 mmHg
AV Peak grad: 7.2 mmHg
Ao pk vel: 1.34 m/s
Area-P 1/2: 3.54 cm2
MV VTI: 6.56 cm2
S' Lateral: 3.06 cm

## 2021-02-23 MED ORDER — PREDNISONE 10 MG (21) PO TBPK: ORAL_TABLET | ORAL | 0 refills | Status: DC

## 2021-02-23 MED ORDER — CLOPIDOGREL BISULFATE 75 MG PO TABS: 75.0000 mg | ORAL_TABLET | Freq: Every day | ORAL | 0 refills | Status: DC

## 2021-02-23 MED ORDER — LORATADINE 10 MG PO TABS: 10.0000 mg | ORAL_TABLET | Freq: Every day | ORAL | 0 refills | Status: DC

## 2021-02-23 MED ORDER — APIXABAN 5 MG PO TABS
5.0000 mg | ORAL_TABLET | Freq: Two times a day (BID) | ORAL | Status: DC
  Administered 2021-02-23: 08:00:00 5 mg via ORAL
  Filled 2021-02-23: qty 1

## 2021-02-23 MED ORDER — APIXABAN 5 MG PO TABS: 5.0000 mg | ORAL_TABLET | Freq: Two times a day (BID) | ORAL | 0 refills | Status: AC

## 2021-02-23 MED ORDER — HYDRALAZINE HCL 20 MG/ML IJ SOLN
2.0000 mg | Freq: Once | INTRAMUSCULAR | Status: AC
  Administered 2021-02-23: 01:00:00 2 mg via INTRAVENOUS
  Filled 2021-02-23: qty 1

## 2021-02-23 MED ORDER — PSEUDOEPHEDRINE HCL ER 120 MG PO TB12: 120.0000 mg | ORAL_TABLET | Freq: Two times a day (BID) | ORAL | Status: DC | PRN

## 2021-02-23 MED ORDER — ACETAMINOPHEN 325 MG PO TABS: 650.0000 mg | ORAL_TABLET | ORAL | Status: AC | PRN

## 2021-02-23 MED ORDER — FLUTICASONE PROPIONATE 50 MCG/ACT NA SUSP: 2.0000 | Freq: Every day | NASAL | 0 refills | Status: DC

## 2021-02-23 MED ORDER — GADOBUTROL 1 MMOL/ML IV SOLN
7.5000 mL | Freq: Once | INTRAVENOUS | Status: AC | PRN
  Administered 2021-02-23: 02:00:00 7.5 mL via INTRAVENOUS

## 2021-02-23 MED ORDER — DM-GUAIFENESIN ER 30-600 MG PO TB12: 1.0000 | ORAL_TABLET | Freq: Two times a day (BID) | ORAL | Status: DC

## 2021-02-23 NOTE — Progress Notes (Signed)
Requested to bedside for echocardiogram bubble study.  Assessed PIV left AC and flushed with NS easily, set up and completed bubble study with Jacob Velasquez. Flushed and reclamped PIV tubing.

## 2021-02-23 NOTE — Progress Notes (Signed)
ANTICOAGULATION CONSULT NOTE - Initial Consult  Pharmacy Consult for apixaban Indication: atrial fibrillation  No Known Allergies  Patient Measurements: Height: 6\' 2"  (188 cm) Weight: 98.1 kg (216 lb 4.3 oz) IBW/kg (Calculated) : 82.2 Heparin Dosing Weight:    Vital Signs: Temp: 98.5 F (36.9 C) (01/23 2122) BP: 144/87 (01/24 0100) Pulse Rate: 79 (01/24 0100)  Labs: Recent Labs    02/21/21 1813  HGB 15.6  HCT 47.1  PLT 254  APTT 31  LABPROT 12.9  INR 1.0  CREATININE 1.24    Estimated Creatinine Clearance: 59.8 mL/min (by C-G formula based on SCr of 1.24 mg/dL).   Medical History: Past Medical History:  Diagnosis Date   Hypertension    TIA (transient ischemic attack)     Medications:  Scheduled:    stroke: mapping our early stages of recovery book   Does not apply Once   apixaban  5 mg Oral BID   aspirin EC  81 mg Oral Daily   atorvastatin  40 mg Oral Daily   celecoxib  100 mg Oral BID   clopidogrel  75 mg Oral Daily   dextromethorphan-guaiFENesin  1 tablet Oral BID   hydrochlorothiazide  12.5 mg Oral Daily   loratadine  10 mg Oral Daily   losartan  50 mg Oral Daily   predniSONE  10 mg Oral 3 x daily with food   [START ON 02/24/2021] predniSONE  10 mg Oral 4X daily taper   predniSONE  20 mg Oral Nightly   pseudoephedrine  120 mg Oral BID   Infusions:   Assessment: 76 yo M starting apixaban for new Atrial fibrillation Hgb 15.6  Plt 254 On ASA per Med Rec PTA Currently ordered Heparin SQ 5000 units q12h   Goal of Therapy:   Monitor platelets by anticoagulation protocol: Yes   Plan:  Will start apixaban 5 mg po BID. Will discontinue Heparin SQ Will f/u Scr/CBC per protocol  Aspasia Rude A 02/23/2021,7:25 AM

## 2021-02-23 NOTE — Discharge Summary (Signed)
Physician Discharge Summary  Jacob Velasquez VEL:[id number] DOB: September 25, 1945 DOA: 02/21/2021  PCP: Baxter Hire, MD  Admit date: 02/21/2021 Discharge date: 02/23/2021  Admitted From: Home Disposition: Home  Recommendations for Outpatient Follow-up:  Follow up with PCP within 1-2 weeks Hgb A1c 6.1- pre-diabetes. Recommend further discussion on lifestyle changes to improve glycemic control and reduce stroke risk Newly diagnosed Afib seen on admission. Asymptomatic and rate controlled. Started on eliquis prior to discharge.  Echo showed eEF 65-70%. Grade III dd. Normal L atrial size and valves.  Cranial nerve palsy of left eye. Brain imaging negative for stroke.  Follow up with Dr. Michel Bickers of Neuroophthalmology at [facility]  Outpatient PT- vestibular rehab  Discharge Condition:stable CODE STATUS:  Code Status: DNR  Regular healthy diet  Brief/Interim Summary: Pt presented after acute onset of left eyelid drooping and diplopia. He underwent stroke workup including CT head, MRI brain without contrast, MRA head and neck, and MRI orbits with and without contrast. It was largely negative for acute stroke.  Pertinent findings included enhancement of left third cranial nerve extending from the cisternal portion to the cavernous sinus portion. The differential for this cause is broad. I associated this with his concurrent viral sinus infection which was evidenced by history and physical.  Neurology was consulted and recommended further workup to include LP. Unfortunately, I discharged patient prior to recommendation. Dr Cheral Marker recommended patient return to hospital for further workup including LP and patient agreed. He will be returning 1/25 to complete workup. Please see his note for further detail.  Additionally, patient was found to have Afib on presentation. He was asymptomatic and rate controlled.  Eliquis was started and then held at discharge to plan for LP.    Discharge Diagnoses:  Principal Problem:   Diplopia Active Problems:   Essential hypertension   History of tobacco abuse   CVA, old, cognitive deficits   AF (paroxysmal atrial fibrillation) ([place])   New onset atrial fibrillation ([facility])  Allergies as of 02/23/2021   No Known Allergies      Medication List     STOP taking these medications    aspirin 81 MG EC tablet   celecoxib 100 MG capsule Commonly known as: CELEBREX       TAKE these medications    acetaminophen 325 MG tablet Commonly known as: TYLENOL Take 2 tablets (650 mg total) by mouth every 4 (four) hours as needed for mild pain (or temp > 37.5 C (99.5 F)).   apixaban 5 MG Tabs tablet Commonly known as: ELIQUIS Take 1 tablet (5 mg total) by mouth 2 (two) times daily.   atorvastatin 40 MG tablet Commonly known as: Lipitor Take 1 tablet (40 mg total) by mouth daily. What changed: Another medication with the same name was removed. Continue taking this medication, and follow the directions you see here.   clopidogrel 75 MG tablet Commonly known as: PLAVIX Take 1 tablet (75 mg total) by mouth daily. Start taking on: February 24, 2021   dextromethorphan-guaiFENesin 30-600 MG 12hr tablet Commonly known as: MUCINEX DM Take 1 tablet by mouth 2 (two) times daily.   fluticasone 50 MCG/ACT nasal spray Commonly known as: FLONASE Place 2 sprays into both nostrils daily.   hydrochlorothiazide 12.5 MG tablet Commonly known as: HYDRODIURIL Take 12.5 mg by mouth daily.   loratadine 10 MG tablet Commonly known as: CLARITIN Take 1 tablet (10 mg total) by mouth daily. Start taking on: February 24, 2021   losartan 50  MG tablet Commonly known as: COZAAR Take 50 mg by mouth daily.   predniSONE 10 MG (21) Tbpk tablet Commonly known as: STERAPRED UNI-PAK 21 TAB Take as instructed on packaging   pseudoephedrine 120 MG 12 hr tablet Commonly known as: SUDAFED Take 1 tablet (120 mg total) by mouth every 12 (twelve)  hours as needed for congestion.        No Known Allergies  Consultations: Neurology   Procedures/Studies: CT HEAD WO CONTRAST  Result Date: 02/21/2021 CLINICAL DATA:  Seizure, new-onset, no history of trauma. Double vision. EXAM: CT HEAD WITHOUT CONTRAST TECHNIQUE: Contiguous axial images were obtained from the base of the skull through the vertex without intravenous contrast. RADIATION DOSE REDUCTION: This exam was performed according to the departmental dose-optimization program which includes automated exposure control, adjustment of the mA and/or kV according to patient size and/or use of iterative reconstruction technique. COMPARISON:  07/30/2020 FINDINGS: Brain: There is atrophy and chronic small vessel disease changes. No acute intracranial abnormality. Specifically, no hemorrhage, hydrocephalus, mass lesion, acute infarction, or significant intracranial injury. Vascular: No hyperdense vessel or unexpected calcification. Skull: No acute calvarial abnormality. Sinuses/Orbits: No acute findings Other: None IMPRESSION: Atrophy, chronic microvascular disease. No acute intracranial abnormality. Electronically Signed   By: Rolm Baptise M.D.   On: 02/21/2021 18:39   MR ANGIO HEAD WO CONTRAST  Result Date: 02/22/2021 CLINICAL DATA:  Initial evaluation for acute neuro deficit, stroke suspected, double vision. EXAM: MRI HEAD WITHOUT CONTRAST MRA HEAD WITHOUT CONTRAST MRA NECK WITHOUT AND WITH CONTRAST TECHNIQUE: Multiplanar, multi-echo pulse sequences of the brain and surrounding structures were acquired without intravenous contrast. Angiographic images of the Circle of Willis were acquired using MRA technique without intravenous contrast. Angiographic images of the neck were acquired using MRA technique without and with intravenous contrast. Carotid stenosis measurements (when applicable) are obtained utilizing NASCET criteria, using the distal internal carotid diameter as the denominator. CONTRAST:   65mL GADAVIST GADOBUTROL 1 MMOL/ML IV SOLN COMPARISON:  Prior head CT from 02/21/2021. FINDINGS: MRI HEAD FINDINGS Brain: Diffuse prominence of the CSF containing spaces compatible generalized cerebral atrophy. Patchy and confluent T2/FLAIR hyperintensity involving the periventricular and deep white matter both cerebral hemispheres most consistent with chronic small vessel ischemic disease, mild-to-moderate in nature. Few scattered superimposed remote lacunar infarcts present about the bilateral basal ganglia/corona radiata. Small remote lacunar infarct noted at the right dorsal pons (series 10, image 8). Few probable tiny remote cerebellar infarcts noted as well. No abnormal foci of restricted diffusion to suggest acute or subacute ischemia. Gray-white matter differentiation maintained. No encephalomalacia to suggest chronic cortical infarction. No acute or chronic intracranial blood products. No mass lesion, midline shift or mass effect no hydrocephalus or extra-axial fluid collection. Pituitary gland suprasellar region normal. Midline structures intact. Vascular: Major intracranial vascular flow voids are maintained. 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 Bone marrow signal intensity within normal limits. No scalp soft tissue abnormality. Sinuses/Orbits: Globes and orbital soft tissues demonstrate no acute finding. Paranasal sinuses are largely clear. No mastoid effusion. Inner ear structures grossly normal. Other: None. MRA HEAD FINDINGS Anterior circulation: Visualized distal cervical segments of the internal carotid arteries are patent with antegrade flow. Petrous segments patent bilaterally. Mild atheromatous irregularity seen throughout the carotid siphons without hemodynamically significant stenosis or other abnormality. A1 segments patent bilaterally. Normal anterior communicating artery complex. Anterior cerebral arteries patent without significant  stenosis. No M1 stenosis or occlusion. Normal MCA bifurcations. Distal MCA branches well  perfused and symmetric. Mild diffuse small vessel atheromatous irregularity. Posterior circulation: Both vertebral arteries widely patent to the vertebrobasilar junction without stenosis. Left PICA origin patent. Right PICA not seen. Basilar patent to its distal aspect without stenosis. Superior cerebellar arteries patent bilaterally. Both PCA supplied via the basilar as well as small bowel posterior to the arteries. Short-segment moderate stenosis at the right P1/P2 junction (series 1052, image 9). Smooth moderate stenosis noted involving the distal left P2 segment (series 1058, image 4). PCAs otherwise patent to their distal aspects without stenosis. Distal small vessel atheromatous irregularity noted. Anatomic variants: None significant.  No aneurysm. MRA NECK FINDINGS Aortic arch: Visualized aortic arch normal caliber with normal branch pattern. No visible stenosis seen about the origin of the great vessels. Right carotid system: Right CCA patent from its origin to the bifurcation without stenosis. Atheromatous irregularity about the right carotid bulb/proximal right ICA without hemodynamically significant stenosis. Right ICA patent distally without stenosis or evidence for dissection. Left carotid system: Left CCA patent from its origin to the bifurcation without stenosis. Moderate atheromatous irregularity and plaque present about the left carotid bulb/proximal left ICA with associated stenosis of up to approximately 60% by NASCET criteria (series 1095, image 4). Left ICA otherwise patent distally without stenosis or evidence for dissection. Vertebral arteries: Both vertebral arteries arise from the subclavian arteries. No proximal subclavian artery stenosis. Vertebral arteries are largely codominant widely patent without stenosis or evidence for dissection. Other: None IMPRESSION: MRI HEAD: 1. No acute intracranial  abnormality. 2. Generalized cerebral atrophy with mild-to-moderate chronic small vessel ischemic disease with a few scattered remote lacunar infarcts involving the bilateral basal ganglia/corona radiata as well as the right dorsal pons. MRA HEAD: 1. Negative intracranial MRA for large vessel occlusion. 2. Moderate short-segment stenoses involving the right P1/P2 junction and distal left P2 segment. 3. Mild diffuse small vessel atheromatous irregularity throughout the intracranial circulation. MRA NECK: 1. 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 2. No hemodynamically significant or correctable stenosis about the right carotid artery system. 3. Wide patency of both vertebral arteries within the neck. 4. No evidence for dissection or other acute vascular abnormality. Electronically Signed   By: Jeannine Boga M.D.   On: 02/22/2021 01:27   MR ANGIO NECK W WO CONTRAST  Result Date: 02/22/2021 CLINICAL DATA:  Initial evaluation for acute neuro deficit, stroke suspected, double vision. EXAM: MRI HEAD WITHOUT CONTRAST MRA HEAD WITHOUT CONTRAST MRA NECK WITHOUT AND WITH CONTRAST TECHNIQUE: Multiplanar, multi-echo pulse sequences of the brain and surrounding structures were acquired without intravenous contrast. Angiographic images of the Circle of Willis were acquired using MRA technique without intravenous contrast. Angiographic images of the neck were acquired using MRA technique without and with intravenous contrast. Carotid stenosis measurements (when applicable) are obtained utilizing NASCET criteria, using the distal internal carotid diameter as the denominator. CONTRAST:  51mL GADAVIST GADOBUTROL 1 MMOL/ML IV SOLN COMPARISON:  Prior head CT from 02/21/2021. FINDINGS: MRI HEAD FINDINGS Brain: Diffuse prominence of the CSF containing spaces compatible generalized cerebral atrophy. Patchy and confluent T2/FLAIR hyperintensity involving the periventricular and deep  white matter both cerebral hemispheres most consistent with chronic small vessel ischemic disease, mild-to-moderate in nature. Few scattered superimposed remote lacunar infarcts present about the bilateral basal ganglia/corona radiata. Small remote lacunar infarct noted at the right dorsal pons (series 10, image 8). Few probable tiny remote cerebellar infarcts noted as well. No abnormal foci of restricted diffusion to suggest acute or subacute  ischemia. Gray-white matter differentiation maintained. No encephalomalacia to suggest chronic cortical infarction. No acute or chronic intracranial blood products. No mass lesion, midline shift or mass effect no hydrocephalus or extra-axial fluid collection. Pituitary gland suprasellar region normal. Midline structures intact. Vascular: Major intracranial vascular flow voids are maintained. 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 Bone marrow signal intensity within normal limits. No scalp soft tissue abnormality. Sinuses/Orbits: Globes and orbital soft tissues demonstrate no acute finding. Paranasal sinuses are largely clear. No mastoid effusion. Inner ear structures grossly normal. Other: None. MRA HEAD FINDINGS Anterior circulation: Visualized distal cervical segments of the internal carotid arteries are patent with antegrade flow. Petrous segments patent bilaterally. Mild atheromatous irregularity seen throughout the carotid siphons without hemodynamically significant stenosis or other abnormality. A1 segments patent bilaterally. Normal anterior communicating artery complex. Anterior cerebral arteries patent without significant stenosis. No M1 stenosis or occlusion. Normal MCA bifurcations. Distal MCA branches well perfused and symmetric. Mild diffuse small vessel atheromatous irregularity. Posterior circulation: Both vertebral arteries widely patent to the vertebrobasilar junction without stenosis. Left PICA origin  patent. Right PICA not seen. Basilar patent to its distal aspect without stenosis. Superior cerebellar arteries patent bilaterally. Both PCA supplied via the basilar as well as small bowel posterior to the arteries. Short-segment moderate stenosis at the right P1/P2 junction (series 1052, image 9). Smooth moderate stenosis noted involving the distal left P2 segment (series 1058, image 4). PCAs otherwise patent to their distal aspects without stenosis. Distal small vessel atheromatous irregularity noted. Anatomic variants: None significant.  No aneurysm. MRA NECK FINDINGS Aortic arch: Visualized aortic arch normal caliber with normal branch pattern. No visible stenosis seen about the origin of the great vessels. Right carotid system: Right CCA patent from its origin to the bifurcation without stenosis. Atheromatous irregularity about the right carotid bulb/proximal right ICA without hemodynamically significant stenosis. Right ICA patent distally without stenosis or evidence for dissection. Left carotid system: Left CCA patent from its origin to the bifurcation without stenosis. Moderate atheromatous irregularity and plaque present about the left carotid bulb/proximal left ICA with associated stenosis of up to approximately 60% by NASCET criteria (series 1095, image 4). Left ICA otherwise patent distally without stenosis or evidence for dissection. Vertebral arteries: Both vertebral arteries arise from the subclavian arteries. No proximal subclavian artery stenosis. Vertebral arteries are largely codominant widely patent without stenosis or evidence for dissection. Other: None IMPRESSION: MRI HEAD: 1. No acute intracranial abnormality. 2. Generalized cerebral atrophy with mild-to-moderate chronic small vessel ischemic disease with a few scattered remote lacunar infarcts involving the bilateral basal ganglia/corona radiata as well as the right dorsal pons. MRA HEAD: 1. Negative intracranial MRA for large vessel  occlusion. 2. Moderate short-segment stenoses involving the right P1/P2 junction and distal left P2 segment. 3. Mild diffuse small vessel atheromatous irregularity throughout the intracranial circulation. MRA NECK: 1. 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 2. No hemodynamically significant or correctable stenosis about the right carotid artery system. 3. Wide patency of both vertebral arteries within the neck. 4. No evidence for dissection or other acute vascular abnormality. Electronically Signed   By: Jeannine Boga M.D.   On: 02/22/2021 01:27   MR BRAIN WO CONTRAST  Result Date: 02/22/2021 CLINICAL DATA:  Initial evaluation for acute neuro deficit, stroke suspected, double vision. EXAM: MRI HEAD WITHOUT CONTRAST MRA HEAD WITHOUT CONTRAST MRA NECK WITHOUT AND WITH CONTRAST TECHNIQUE: Multiplanar, multi-echo pulse sequences of the  brain and surrounding structures were acquired without intravenous contrast. Angiographic images of the Circle of Willis were acquired using MRA technique without intravenous contrast. Angiographic images of the neck were acquired using MRA technique without and with intravenous contrast. Carotid stenosis measurements (when applicable) are obtained utilizing NASCET criteria, using the distal internal carotid diameter as the denominator. CONTRAST:  64mL GADAVIST GADOBUTROL 1 MMOL/ML IV SOLN COMPARISON:  Prior head CT from 02/21/2021. FINDINGS: MRI HEAD FINDINGS Brain: Diffuse prominence of the CSF containing spaces compatible generalized cerebral atrophy. Patchy and confluent T2/FLAIR hyperintensity involving the periventricular and deep white matter both cerebral hemispheres most consistent with chronic small vessel ischemic disease, mild-to-moderate in nature. Few scattered superimposed remote lacunar infarcts present about the bilateral basal ganglia/corona radiata. Small remote lacunar infarct noted at the right dorsal pons  (series 10, image 8). Few probable tiny remote cerebellar infarcts noted as well. No abnormal foci of restricted diffusion to suggest acute or subacute ischemia. Gray-white matter differentiation maintained. No encephalomalacia to suggest chronic cortical infarction. No acute or chronic intracranial blood products. No mass lesion, midline shift or mass effect no hydrocephalus or extra-axial fluid collection. Pituitary gland suprasellar region normal. Midline structures intact. Vascular: Major intracranial vascular flow voids are maintained. 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 Bone marrow signal intensity within normal limits. No scalp soft tissue abnormality. Sinuses/Orbits: Globes and orbital soft tissues demonstrate no acute finding. Paranasal sinuses are largely clear. No mastoid effusion. Inner ear structures grossly normal. Other: None. MRA HEAD FINDINGS Anterior circulation: Visualized distal cervical segments of the internal carotid arteries are patent with antegrade flow. Petrous segments patent bilaterally. Mild atheromatous irregularity seen throughout the carotid siphons without hemodynamically significant stenosis or other abnormality. A1 segments patent bilaterally. Normal anterior communicating artery complex. Anterior cerebral arteries patent without significant stenosis. No M1 stenosis or occlusion. Normal MCA bifurcations. Distal MCA branches well perfused and symmetric. Mild diffuse small vessel atheromatous irregularity. Posterior circulation: Both vertebral arteries widely patent to the vertebrobasilar junction without stenosis. Left PICA origin patent. Right PICA not seen. Basilar patent to its distal aspect without stenosis. Superior cerebellar arteries patent bilaterally. Both PCA supplied via the basilar as well as small bowel posterior to the arteries. Short-segment moderate stenosis at the right P1/P2 junction (series 1052, image  9). Smooth moderate stenosis noted involving the distal left P2 segment (series 1058, image 4). PCAs otherwise patent to their distal aspects without stenosis. Distal small vessel atheromatous irregularity noted. Anatomic variants: None significant.  No aneurysm. MRA NECK FINDINGS Aortic arch: Visualized aortic arch normal caliber with normal branch pattern. No visible stenosis seen about the origin of the great vessels. Right carotid system: Right CCA patent from its origin to the bifurcation without stenosis. Atheromatous irregularity about the right carotid bulb/proximal right ICA without hemodynamically significant stenosis. Right ICA patent distally without stenosis or evidence for dissection. Left carotid system: Left CCA patent from its origin to the bifurcation without stenosis. Moderate atheromatous irregularity and plaque present about the left carotid bulb/proximal left ICA with associated stenosis of up to approximately 60% by NASCET criteria (series 1095, image 4). Left ICA otherwise patent distally without stenosis or evidence for dissection. Vertebral arteries: Both vertebral arteries arise from the subclavian arteries. No proximal subclavian artery stenosis. Vertebral arteries are largely codominant widely patent without stenosis or evidence for dissection. Other: None IMPRESSION: MRI HEAD: 1. No acute intracranial abnormality. 2. Generalized cerebral atrophy with mild-to-moderate chronic small vessel ischemic disease  with a few scattered remote lacunar infarcts involving the bilateral basal ganglia/corona radiata as well as the right dorsal pons. MRA HEAD: 1. Negative intracranial MRA for large vessel occlusion. 2. Moderate short-segment stenoses involving the right P1/P2 junction and distal left P2 segment. 3. Mild diffuse small vessel atheromatous irregularity throughout the intracranial circulation. MRA NECK: 1. Moderate approximate 60% atheromatous stenosis at the origin of the cervical left  ICA. 2. No hemodynamically significant or correctable stenosis about the right carotid artery system. 3. Wide patency of both vertebral arteries within the neck. 4. No evidence for dissection or other acute vascular abnormality. Electronically Signed   By: Jeannine Boga M.D.   On: 02/22/2021 01:27   MR ORBITS W WO CONTRAST  Result Date: 02/23/2021 CLINICAL DATA:  Left ophthalmoplegia EXAM: MRI OF THE ORBITS WITHOUT AND WITH CONTRAST TECHNIQUE: Multiplanar, multi-echo pulse sequences of the orbits and surrounding structures were acquired including fat saturation techniques, before and after intravenous contrast administration. CONTRAST:  7.32mL GADAVIST GADOBUTROL 1 MMOL/ML IV SOLN COMPARISON:  MRI head 02/22/2021 FINDINGS: Orbits: No orbital mass or evidence of inflammation. Normal appearance of the globes, optic nerve-sheath complexes, extraocular muscles, orbital fat and lacrimal glands. Visualized sinuses: Clear. Soft tissues: Normal. Limited intracranial: Enhancement of the left third cranial nerve, extending from the cisternal portion to the cavernous sinus portion (series 14, images 11-17). The nerve is not significantly enlarged. 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 Electronically Signed   By: Merilyn Baba M.D.   On: 02/23/2021 04:09    Subjective: Patient states that his sinus pressure is improved with treatment and has no headache. Continues to have diplopia and decreased ROM of left eye.   Discharge Exam: Vitals:   02/23/21 0100 02/23/21 0754  BP: (!) 144/87 (!) 152/82  Pulse: 79 73  Resp: 19 16  Temp:  97.7 F (36.5 C)  SpO2: 96% 97%    General: Pt is alert, awake, not in acute distress HEENT: positive erythema and clear drainage bilaterally nares. Negative sinus tenderness to percussion Cardiovascular: irregular rhythm, regular rate. No murmurs. Quick cap refill Respiratory: CTA  bilaterally, no wheezing, no rhonchi Neuro: alert and oriented. Normal speech. Symmetrical face other than left eyelid ptosis. Decreased nasal and superior EOM.  Extremities: no edema, no cyanosis  Labs: Basic Metabolic Panel: Recent Labs  Lab 02/21/21 1813  NA 136  K 3.6  CL 104  CO2 24  GLUCOSE 139*  BUN 17  CREATININE 1.24  CALCIUM 9.0   CBC: Recent Labs  Lab 02/21/21 1813  WBC 7.8  NEUTROABS 5.2  HGB 15.6  HCT 47.1  MCV 91.1  PLT 254    Microbiology No results found for this or any previous visit (from the past 240 hour(s)).  Time coordinating discharge: Over 30 minutes  Richarda Osmond, MD  [facility] 02/23/2021, 9:35 AM

## 2021-02-23 NOTE — Progress Notes (Signed)
*  PRELIMINARY RESULTS* Echocardiogram 2D Echocardiogram has been performed.  Jacob Velasquez 02/23/2021, 9:54 AM

## 2021-02-23 NOTE — Progress Notes (Incomplete)
PROGRESS NOTE  [name]    DOB: December 08, 1945, 76 y.o.  UXN:[id number]  PCP: Baxter Hire, MD   Code Status: DNR   DOA: 02/21/2021   LOS: 2  Brief Narrative of Current Hospitalization  Jacob Velasquez is a 76 y.o. male with a PMH significant for HTN, tobacco use, CVA. They presented from home to the ED on 02/21/2021 with diplopia x1 days. In the ED, it was found that they had left eye ptosis. For suspected CVA they underwent brain MRI and MRA which was negative for acute stroke. Neurology was consulted.  Patient endorses several days of severe sinus pressure with rhinorrhea prior to onset of the ptosis.  In addition, patient was found to be in afib on presentation which is a new diagnosis. He was not previously on anticoagulation. He is asymptomatic.  Patient was admitted to medicine service for further workup and management of diplopia and ptosis as outlined in detail below.  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 However, imaging of brain has been negative for lesions thus far. Suspect that could be potential viral inflammation of cranial nerve.  - neurology consulted, appreciate recs - steroids - URVI symptomatic relief  New onset Afib- rate controlled. - anticoagulation pending neruo recs  HTN- Bps elevated this admission - continue home medications  H/o CVA- no OT/SLP f/u. PT recommends vestibular OP therapy.  - statin therapy  DVT prophylaxis: heparin injection 5,000 Units Start: 02/21/21 2245  Diet:  Diet Orders (From admission, onward)     Start     Ordered   02/22/21 0948  Diet Heart Room service appropriate? Yes; Fluid  consistency: Thin  Diet effective now       Question Answer Comment  Room service appropriate? Yes   Fluid consistency: Thin      02/22/21 0947            Subjective 02/23/21    Pt reports no headache. Continues to have diplopia when both eyes open.   Disposition Plan & Communication  Patient status: Inpatient  Admitted From: Home Disposition: Home Anticipated discharge date: TBD  Family Communication: none  Consults, Procedures, Significant Events  Consultants:  neurology  Procedures/significant events:  MRI, MRA head and neck   Antimicrobials:  Anti-infectives (From admission, onward)    None       Objective   Vitals:   02/22/21 1635 02/22/21 2122 02/23/21 0002 02/23/21 0100  BP: (!) 176/99 (!) 162/108 (!) 151/109 (!) 144/87  Pulse:  88 88 79  Resp:    19  Temp:  98.5 F (36.9 C)    TempSrc:      SpO2:  96% 98% 96%  Weight:      Height:        Intake/Output Summary (Last 24 hours) at 02/23/2021 0654 Last data filed at 02/22/2021 1849 Gross per 24 hour  Intake 360 ml  Output --  Net 360 ml   Filed Weights   02/21/21 1757 02/22/21 0132  Weight: 97.5 kg 98.1 kg    Patient BMI: Body mass index is 27.77 kg/m.   Physical Exam:  General:  awake, alert, NAD HEENT: left eyelid ptosis. Sluggish EOM decreased inferior/left gaze. Negative nystagmus. Pupil constricted.  Respiratory: normal respiratory effort. Cardiovascular: normal S1/S2, RRR,  no murmur, quick capillary refill Nervous: A&O x3. CN abnormality above Extremities: moves all equally, no edema, normal tone Skin: dry, intact, normal temperature, normal color. No rashes, lesions or ulcers on exposed skin Psychiatry: normal mood, congruent affect  Labs   I have personally reviewed following labs and imaging studies Admission on 02/21/2021  Component Date Value Ref Range Status   Prothrombin Time 02/21/2021 12.9  11.4 - 15.2 seconds Final   INR 02/21/2021 1.0  0.8 - 1.2 Final   aPTT  02/21/2021 31  24 - 36 seconds Final   WBC 02/21/2021 7.8  4.0 - 10.5 K/uL Final   RBC 02/21/2021 5.17  4.22 - 5.81 MIL/uL Final   Hemoglobin 02/21/2021 15.6  13.0 - 17.0 g/dL Final   HCT 02/21/2021 47.1  39.0 - 52.0 % Final   MCV 02/21/2021 91.1  80.0 - 100.0 fL Final   MCH 02/21/2021 30.2  26.0 - 34.0 pg Final   MCHC 02/21/2021 33.1  30.0 - 36.0 g/dL Final   RDW 02/21/2021 14.1  11.5 - 15.5 % Final   Platelets 02/21/2021 254  150 - 400 K/uL Final   nRBC 02/21/2021 0.0  0.0 - 0.2 % Final   Neutrophils Relative % 02/21/2021 67  % Final   Neutro Abs 02/21/2021 5.2  1.7 - 7.7 K/uL Final   Lymphocytes Relative 02/21/2021 22  % Final   Lymphs Abs 02/21/2021 1.7  0.7 - 4.0 K/uL Final   Monocytes Relative 02/21/2021 8  % Final   Monocytes Absolute 02/21/2021 0.6  0.1 - 1.0 K/uL Final   Eosinophils Relative 02/21/2021 2  % Final   Eosinophils Absolute 02/21/2021 0.1  0.0 - 0.5 K/uL Final   Basophils Relative 02/21/2021 1  % Final   Basophils Absolute 02/21/2021 0.1  0.0 - 0.1 K/uL Final   Immature Granulocytes 02/21/2021 0  % Final   Abs Immature Granulocytes 02/21/2021 0.02  0.00 - 0.07 K/uL Final   Sodium 02/21/2021 136  135 - 145 mmol/L Final   Potassium 02/21/2021 3.6  3.5 - 5.1 mmol/L Final   Chloride 02/21/2021 104  98 - 111 mmol/L Final   CO2 02/21/2021 24  22 - 32 mmol/L Final   Glucose, Bld 02/21/2021 139 (H)  70 - 99 mg/dL Final   BUN 02/21/2021 17  8 - 23 mg/dL Final   Creatinine, Ser 02/21/2021 1.24  0.61 - 1.24 mg/dL Final   Calcium 02/21/2021 9.0  8.9 - 10.3 mg/dL Final   Total Protein 02/21/2021 7.3  6.5 - 8.1 g/dL Final   Albumin 02/21/2021 3.8  3.5 - 5.0 g/dL Final   AST 02/21/2021 21  15 - 41 U/L Final   ALT 02/21/2021 16  0 - 44 U/L Final   Alkaline Phosphatase 02/21/2021 68  38 - 126 U/L Final   Total Bilirubin 02/21/2021 1.4 (H)  0.3 - 1.2 mg/dL Final   GFR, Estimated 02/21/2021 >60  >60 mL/min Final   Anion gap 02/21/2021 8  5 - 15 Final   Glucose-Capillary  02/21/2021 113 (H)  70 - 99 mg/dL Final   Hgb A1c MFr Bld 02/22/2021 6.1 (H)  4.8 - 5.6 % Final   Mean Plasma Glucose 02/22/2021 128  mg/dL Final   Cholesterol 02/22/2021 153  0 - 200 mg/dL Final   Triglycerides 02/22/2021 56  <150 mg/dL Final  HDL 02/22/2021 52  >40 mg/dL Final   Total CHOL/HDL Ratio 02/22/2021 2.9  RATIO Final   VLDL 02/22/2021 11  0 - 40 mg/dL Final   LDL Cholesterol 02/22/2021 90  0 - 99 mg/dL Final    Imaging Studies  CT HEAD WO CONTRAST  Result Date: 02/21/2021 CLINICAL DATA:  Seizure, new-onset, no history of trauma. Double vision. EXAM: CT HEAD WITHOUT CONTRAST TECHNIQUE: Contiguous axial images were obtained from the base of the skull through the vertex without intravenous contrast. RADIATION DOSE REDUCTION: This exam was performed according to the departmental dose-optimization program which includes automated exposure control, adjustment of the mA and/or kV according to patient size and/or use of iterative reconstruction technique. COMPARISON:  07/30/2020 FINDINGS: Brain: There is atrophy and chronic small vessel disease changes. No acute intracranial abnormality. Specifically, no hemorrhage, hydrocephalus, mass lesion, acute infarction, or significant intracranial injury. Vascular: No hyperdense vessel or unexpected calcification. Skull: No acute calvarial abnormality. Sinuses/Orbits: No acute findings Other: None IMPRESSION: Atrophy, chronic microvascular disease. No acute intracranial abnormality. Electronically Signed   By: Rolm Baptise M.D.   On: 02/21/2021 18:39   MR ANGIO HEAD WO CONTRAST  Result Date: 02/22/2021 CLINICAL DATA:  Initial evaluation for acute neuro deficit, stroke suspected, double vision. EXAM: MRI HEAD WITHOUT CONTRAST MRA HEAD WITHOUT CONTRAST MRA NECK WITHOUT AND WITH CONTRAST TECHNIQUE: Multiplanar, multi-echo pulse sequences of the brain and surrounding structures were acquired without intravenous contrast. Angiographic images of the Circle of  [place] were acquired using MRA technique without intravenous contrast. Angiographic images of the neck were acquired using MRA technique without and with intravenous contrast. Carotid stenosis measurements (when applicable) are obtained utilizing NASCET criteria, using the distal internal carotid diameter as the denominator. CONTRAST:  48mL GADAVIST GADOBUTROL 1 MMOL/ML IV SOLN COMPARISON:  Prior head CT from 02/21/2021. FINDINGS: MRI HEAD FINDINGS Brain: Diffuse prominence of the CSF containing spaces compatible generalized cerebral atrophy. Patchy and confluent T2/FLAIR hyperintensity involving the periventricular and deep white matter both cerebral hemispheres most consistent with chronic small vessel ischemic disease, mild-to-moderate in nature. Few scattered superimposed remote lacunar infarcts present about the bilateral basal ganglia/corona radiata. Small remote lacunar infarct noted at the right dorsal pons (series 10, image 8). Few probable tiny remote cerebellar infarcts noted as well. No abnormal foci of restricted diffusion to suggest acute or subacute ischemia. Gray-white matter differentiation maintained. No encephalomalacia to suggest chronic cortical infarction. No acute or chronic intracranial blood products. No mass lesion, midline shift or mass effect no hydrocephalus or extra-axial fluid collection. Pituitary gland suprasellar region normal. Midline structures intact. Vascular: Major intracranial vascular flow voids are maintained. 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 Bone marrow signal intensity within normal limits. No scalp soft tissue abnormality. Sinuses/Orbits: Globes and orbital soft tissues demonstrate no acute finding. Paranasal sinuses are largely clear. No mastoid effusion. Inner ear structures grossly normal. Other: None. MRA HEAD FINDINGS Anterior circulation: Visualized distal cervical segments of the internal carotid  arteries are patent with antegrade flow. Petrous segments patent bilaterally. Mild atheromatous irregularity seen throughout the carotid siphons without hemodynamically significant stenosis or other abnormality. A1 segments patent bilaterally. Normal anterior communicating artery complex. Anterior cerebral arteries patent without significant stenosis. No M1 stenosis or occlusion. Normal MCA bifurcations. Distal MCA branches well perfused and symmetric. Mild diffuse small vessel atheromatous irregularity. Posterior circulation: Both vertebral arteries widely patent to the vertebrobasilar junction without stenosis. Left PICA origin patent. Right PICA not seen. Basilar  patent to its distal aspect without stenosis. Superior cerebellar arteries patent bilaterally. Both PCA supplied via the basilar as well as small bowel posterior to the arteries. Short-segment moderate stenosis at the right P1/P2 junction (series 1052, image 9). Smooth moderate stenosis noted involving the distal left P2 segment (series 1058, image 4). PCAs otherwise patent to their distal aspects without stenosis. Distal small vessel atheromatous irregularity noted. Anatomic variants: None significant.  No aneurysm. MRA NECK FINDINGS Aortic arch: Visualized aortic arch normal caliber with normal branch pattern. No visible stenosis seen about the origin of the great vessels. Right carotid system: Right CCA patent from its origin to the bifurcation without stenosis. Atheromatous irregularity about the right carotid bulb/proximal right ICA without hemodynamically significant stenosis. Right ICA patent distally without stenosis or evidence for dissection. Left carotid system: Left CCA patent from its origin to the bifurcation without stenosis. Moderate atheromatous irregularity and plaque present about the left carotid bulb/proximal left ICA with associated stenosis of up to approximately 60% by NASCET criteria (series 1095, image 4). Left ICA otherwise  patent distally without stenosis or evidence for dissection. Vertebral arteries: Both vertebral arteries arise from the subclavian arteries. No proximal subclavian artery stenosis. Vertebral arteries are largely codominant widely patent without stenosis or evidence for dissection. Other: None IMPRESSION: MRI HEAD: 1. No acute intracranial abnormality. 2. Generalized cerebral atrophy with mild-to-moderate chronic small vessel ischemic disease with a few scattered remote lacunar infarcts involving the bilateral basal ganglia/corona radiata as well as the right dorsal pons. MRA HEAD: 1. Negative intracranial MRA for large vessel occlusion. 2. Moderate short-segment stenoses involving the right P1/P2 junction and distal left P2 segment. 3. Mild diffuse small vessel atheromatous irregularity throughout the intracranial circulation. MRA NECK: 1. 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 2. No hemodynamically significant or correctable stenosis about the right carotid artery system. 3. Wide patency of both vertebral arteries within the neck. 4. No evidence for dissection or other acute vascular abnormality. Electronically Signed   By: Jeannine Boga M.D.   On: 02/22/2021 01:27   MR ANGIO NECK W WO CONTRAST  Result Date: 02/22/2021 CLINICAL DATA:  Initial evaluation for acute neuro deficit, stroke suspected, double vision. EXAM: MRI HEAD WITHOUT CONTRAST MRA HEAD WITHOUT CONTRAST MRA NECK WITHOUT AND WITH CONTRAST TECHNIQUE: Multiplanar, multi-echo pulse sequences of the brain and surrounding structures were acquired without intravenous contrast. Angiographic images of the Circle of [place] were acquired using MRA technique without intravenous contrast. Angiographic images of the neck were acquired using MRA technique without and with intravenous contrast. Carotid stenosis measurements (when applicable) are obtained utilizing NASCET criteria, using the distal internal  carotid diameter as the denominator. CONTRAST:  76mL GADAVIST GADOBUTROL 1 MMOL/ML IV SOLN COMPARISON:  Prior head CT from 02/21/2021. FINDINGS: MRI HEAD FINDINGS Brain: Diffuse prominence of the CSF containing spaces compatible generalized cerebral atrophy. Patchy and confluent T2/FLAIR hyperintensity involving the periventricular and deep white matter both cerebral hemispheres most consistent with chronic small vessel ischemic disease, mild-to-moderate in nature. Few scattered superimposed remote lacunar infarcts present about the bilateral basal ganglia/corona radiata. Small remote lacunar infarct noted at the right dorsal pons (series 10, image 8). Few probable tiny remote cerebellar infarcts noted as well. No abnormal foci of restricted diffusion to suggest acute or subacute ischemia. Gray-white matter differentiation maintained. No encephalomalacia to suggest chronic cortical infarction. No acute or chronic intracranial blood products. No mass lesion, midline shift or mass effect no hydrocephalus or extra-axial  fluid collection. Pituitary gland suprasellar region normal. Midline structures intact. Vascular: Major intracranial vascular flow voids are maintained. 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 Bone marrow signal intensity within normal limits. No scalp soft tissue abnormality. Sinuses/Orbits: Globes and orbital soft tissues demonstrate no acute finding. Paranasal sinuses are largely clear. No mastoid effusion. Inner ear structures grossly normal. Other: None. MRA HEAD FINDINGS Anterior circulation: Visualized distal cervical segments of the internal carotid arteries are patent with antegrade flow. Petrous segments patent bilaterally. Mild atheromatous irregularity seen throughout the carotid siphons without hemodynamically significant stenosis or other abnormality. A1 segments patent bilaterally. Normal anterior communicating artery complex.  Anterior cerebral arteries patent without significant stenosis. No M1 stenosis or occlusion. Normal MCA bifurcations. Distal MCA branches well perfused and symmetric. Mild diffuse small vessel atheromatous irregularity. Posterior circulation: Both vertebral arteries widely patent to the vertebrobasilar junction without stenosis. Left PICA origin patent. Right PICA not seen. Basilar patent to its distal aspect without stenosis. Superior cerebellar arteries patent bilaterally. Both PCA supplied via the basilar as well as small bowel posterior to the arteries. Short-segment moderate stenosis at the right P1/P2 junction (series 1052, image 9). Smooth moderate stenosis noted involving the distal left P2 segment (series 1058, image 4). PCAs otherwise patent to their distal aspects without stenosis. Distal small vessel atheromatous irregularity noted. Anatomic variants: None significant.  No aneurysm. MRA NECK FINDINGS Aortic arch: Visualized aortic arch normal caliber with normal branch pattern. No visible stenosis seen about the origin of the great vessels. Right carotid system: Right CCA patent from its origin to the bifurcation without stenosis. Atheromatous irregularity about the right carotid bulb/proximal right ICA without hemodynamically significant stenosis. Right ICA patent distally without stenosis or evidence for dissection. Left carotid system: Left CCA patent from its origin to the bifurcation without stenosis. Moderate atheromatous irregularity and plaque present about the left carotid bulb/proximal left ICA with associated stenosis of up to approximately 60% by NASCET criteria (series 1095, image 4). Left ICA otherwise patent distally without stenosis or evidence for dissection. Vertebral arteries: Both vertebral arteries arise from the subclavian arteries. No proximal subclavian artery stenosis. Vertebral arteries are largely codominant widely patent without stenosis or evidence for dissection. Other: None  IMPRESSION: MRI HEAD: 1. No acute intracranial abnormality. 2. Generalized cerebral atrophy with mild-to-moderate chronic small vessel ischemic disease with a few scattered remote lacunar infarcts involving the bilateral basal ganglia/corona radiata as well as the right dorsal pons. MRA HEAD: 1. Negative intracranial MRA for large vessel occlusion. 2. Moderate short-segment stenoses involving the right P1/P2 junction and distal left P2 segment. 3. Mild diffuse small vessel atheromatous irregularity throughout the intracranial circulation. MRA NECK: 1. 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 2. No hemodynamically significant or correctable stenosis about the right carotid artery system. 3. Wide patency of both vertebral arteries within the neck. 4. No evidence for dissection or other acute vascular abnormality. Electronically Signed   By: Jeannine Boga M.D.   On: 02/22/2021 01:27   MR BRAIN WO CONTRAST  Result Date: 02/22/2021 CLINICAL DATA:  Initial evaluation for acute neuro deficit, stroke suspected, double vision. EXAM: MRI HEAD WITHOUT CONTRAST MRA HEAD WITHOUT CONTRAST MRA NECK WITHOUT AND WITH CONTRAST TECHNIQUE: Multiplanar, multi-echo pulse sequences of the brain and surrounding structures were acquired without intravenous contrast. Angiographic images of the Circle of [place] were acquired using MRA technique without intravenous contrast. Angiographic images of the neck were acquired  using MRA technique without and with intravenous contrast. Carotid stenosis measurements (when applicable) are obtained utilizing NASCET criteria, using the distal internal carotid diameter as the denominator. CONTRAST:  44mL GADAVIST GADOBUTROL 1 MMOL/ML IV SOLN COMPARISON:  Prior head CT from 02/21/2021. FINDINGS: MRI HEAD FINDINGS Brain: Diffuse prominence of the CSF containing spaces compatible generalized cerebral atrophy. Patchy and confluent T2/FLAIR hyperintensity  involving the periventricular and deep white matter both cerebral hemispheres most consistent with chronic small vessel ischemic disease, mild-to-moderate in nature. Few scattered superimposed remote lacunar infarcts present about the bilateral basal ganglia/corona radiata. Small remote lacunar infarct noted at the right dorsal pons (series 10, image 8). Few probable tiny remote cerebellar infarcts noted as well. No abnormal foci of restricted diffusion to suggest acute or subacute ischemia. Gray-white matter differentiation maintained. No encephalomalacia to suggest chronic cortical infarction. No acute or chronic intracranial blood products. No mass lesion, midline shift or mass effect no hydrocephalus or extra-axial fluid collection. Pituitary gland suprasellar region normal. Midline structures intact. Vascular: Major intracranial vascular flow voids are maintained. 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 Bone marrow signal intensity within normal limits. No scalp soft tissue abnormality. Sinuses/Orbits: Globes and orbital soft tissues demonstrate no acute finding. Paranasal sinuses are largely clear. No mastoid effusion. Inner ear structures grossly normal. Other: None. MRA HEAD FINDINGS Anterior circulation: Visualized distal cervical segments of the internal carotid arteries are patent with antegrade flow. Petrous segments patent bilaterally. Mild atheromatous irregularity seen throughout the carotid siphons without hemodynamically significant stenosis or other abnormality. A1 segments patent bilaterally. Normal anterior communicating artery complex. Anterior cerebral arteries patent without significant stenosis. No M1 stenosis or occlusion. Normal MCA bifurcations. Distal MCA branches well perfused and symmetric. Mild diffuse small vessel atheromatous irregularity. Posterior circulation: Both vertebral arteries widely patent to the vertebrobasilar  junction without stenosis. Left PICA origin patent. Right PICA not seen. Basilar patent to its distal aspect without stenosis. Superior cerebellar arteries patent bilaterally. Both PCA supplied via the basilar as well as small bowel posterior to the arteries. Short-segment moderate stenosis at the right P1/P2 junction (series 1052, image 9). Smooth moderate stenosis noted involving the distal left P2 segment (series 1058, image 4). PCAs otherwise patent to their distal aspects without stenosis. Distal small vessel atheromatous irregularity noted. Anatomic variants: None significant.  No aneurysm. MRA NECK FINDINGS Aortic arch: Visualized aortic arch normal caliber with normal branch pattern. No visible stenosis seen about the origin of the great vessels. Right carotid system: Right CCA patent from its origin to the bifurcation without stenosis. Atheromatous irregularity about the right carotid bulb/proximal right ICA without hemodynamically significant stenosis. Right ICA patent distally without stenosis or evidence for dissection. Left carotid system: Left CCA patent from its origin to the bifurcation without stenosis. Moderate atheromatous irregularity and plaque present about the left carotid bulb/proximal left ICA with associated stenosis of up to approximately 60% by NASCET criteria (series 1095, image 4). Left ICA otherwise patent distally without stenosis or evidence for dissection. Vertebral arteries: Both vertebral arteries arise from the subclavian arteries. No proximal subclavian artery stenosis. Vertebral arteries are largely codominant widely patent without stenosis or evidence for dissection. Other: None IMPRESSION: MRI HEAD: 1. No acute intracranial abnormality. 2. Generalized cerebral atrophy with mild-to-moderate chronic small vessel ischemic disease with a few scattered remote lacunar infarcts involving the bilateral basal ganglia/corona radiata as well as the right dorsal pons. MRA HEAD: 1.  Negative intracranial MRA for large vessel occlusion.  2. Moderate short-segment stenoses involving the right P1/P2 junction and distal left P2 segment. 3. Mild diffuse small vessel atheromatous irregularity throughout the intracranial circulation. MRA NECK: 1. 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 2. No hemodynamically significant or correctable stenosis about the right carotid artery system. 3. Wide patency of both vertebral arteries within the neck. 4. No evidence for dissection or other acute vascular abnormality. Electronically Signed   By: Jeannine Boga M.D.   On: 02/22/2021 01:27   MR ORBITS W WO CONTRAST  Result Date: 02/23/2021 CLINICAL DATA:  Left ophthalmoplegia EXAM: MRI OF THE ORBITS WITHOUT AND WITH CONTRAST TECHNIQUE: Multiplanar, multi-echo pulse sequences of the orbits and surrounding structures were acquired including fat saturation techniques, before and after intravenous contrast administration. CONTRAST:  7.73mL GADAVIST GADOBUTROL 1 MMOL/ML IV SOLN COMPARISON:  MRI head 02/22/2021 FINDINGS: Orbits: No orbital mass or evidence of inflammation. Normal appearance of the globes, optic nerve-sheath complexes, extraocular muscles, orbital fat and lacrimal glands. Visualized sinuses: Clear. Soft tissues: Normal. Limited intracranial: Enhancement of the left third cranial nerve, extending from the cisternal portion to the cavernous sinus portion (series 14, images 11-17). The nerve is not significantly enlarged. 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 Electronically Signed   By: Merilyn Baba M.D.   On: 02/23/2021 04:09    Medications   Scheduled Meds:   stroke: mapping our early stages of recovery book   Does not apply Once   aspirin EC  81 mg Oral Daily   atorvastatin  40 mg Oral Daily   celecoxib  100 mg Oral BID   clopidogrel  75 mg Oral Daily    dextromethorphan-guaiFENesin  1 tablet Oral BID   heparin  5,000 Units Subcutaneous Q12H   hydrochlorothiazide  12.5 mg Oral Daily   loratadine  10 mg Oral Daily   losartan  50 mg Oral Daily   predniSONE  10 mg Oral 3 x daily with food   [START ON 02/24/2021] predniSONE  10 mg Oral 4X daily taper   predniSONE  20 mg Oral Nightly   pseudoephedrine  120 mg Oral BID   No recently discontinued medications to reconcile  LOS: 2 days   Richarda Osmond, DO [facility] 02/23/2021, 6:54 AM   Available by Epic secure chat 7AM-7PM. If 7PM-7AM, please contact night-coverage Refer to [email or web address] to contact the [facility] Attending or Consulting provider for this pt

## 2021-02-23 NOTE — Plan of Care (Signed)
The patient was discharged prior to my being able to see him this AM.   MRI orbits with contrast revealed left CN3 enhancement, which would be atypical for a cranial nerve infarction, but possible. The enhancement is highly unlikely to be due to sinus infection and there is also no evidence for sinusitis on MRI. The DDx for enhancing cranial nerve is broad, including the following, which are listed in order of what I feel are highest to lowest priorities to focus on in the context of a) probability of occurrence and b) level of concern with regards to potential morbidity/mortality:  Inflammatory (Tolosa-Hunt syndrome, unusual presentation of CIDP or AIDP, Behcet's syndrome, vasculitis, inflammatory granulomatosis, post-infective syndrome) Infectious (herpes zoster virus, human herpesvirus 1,  mycobacterial, Lyme disease, neurosyphilis) Other: Atypical presentation of ophthalmoplegic migraine  Unlikely to be a schwannoma or a neurofibroma due to acute presentation, but carcinomatosis secondary to CSF seeding of a primary malignancy or nerve root infiltration by leukemia/lymphoma is possible and could present with rapid onset. Marland Kitchen   He will need to return to the hospital for fluoro-guided LP to include the following: IgG index, oligoclonal bands to assess for inflammation, lyme titers, VDRL, cytology, fungal culture and bacterial culture in addition to protein, glucose and cell count  with differential.  Also will need serum RPR, ESR and C-reactive protein drawn.   Depending on results, he may benefit from steroid treatment or antibiotics. If LP is negative, then could be closely followed as outpatient.  I have spoken with the patient by telephone and advised readmission, which he has agreed to after the above was discussed with him. He will arrive to the ED tomorrow at 8 AM and ask the Triage RN to page Neurology. I have also advised him of the following.   - He is currently on prednisone for his  sinus symptoms. I have advised him to stop this for now as there does not appear to be a clear indication for this medication currently. If he is diagnosed with an inflammatory cranial neuritis, then steroids may be indicated.   - Eliquis was started this admission due to newly diagnosed atrial fibrillation seen on EKG as well as rhythm strip. 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 His home ASA for stroke prophylaxis was switched to Plavix this admission. He does not need to be on Plavix while anticoagulated as he has no stent. I have asked him to stop the Plavix. He has also been advised to hold his Eliquis doses tonight and tomorrow AM in anticipation of LP (must wait 24 hours after Eliquis for LP per guidelines). He has been advised that this medication should be restarted after the LP. There is a minimally increased stroke risk with the held Eliquis doses; overall benefits for information gained from LP outweigh the risks.   Also discussed with Dr. Ouida Sills by telephone.    Electronically signed: Dr. Kerney Elbe

## 2021-02-23 NOTE — Discharge Instructions (Addendum)
Follow up with your primary doctor within a week to discuss follow up of what was found during your hospitalization.   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 Please limit your sugar/starch intake and increase your exercise.  You have Atrial fibrillation (a-fib) which is an irregular rhythm of your heart and is a risk for stroke. We started eliquis ( a blood thinner) to help prevent this.   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 Please follow up with a neurological eye specialist as recommended by neurology. Dr. Michel Bickers of Neuroophthalmology at [facility]

## 2021-02-24 ENCOUNTER — Other Ambulatory Visit: Payer: Self-pay

## 2021-02-24 ENCOUNTER — Observation Stay
Admission: EM | Admit: 2021-02-24 | Discharge: 2021-02-25 | Disposition: A | Discharge status: Home / Self Care | Payer: Medicare Other | Attending: Internal Medicine | Admitting: Internal Medicine

## 2021-02-24 ENCOUNTER — Observation Stay: Payer: Medicare Other

## 2021-02-24 ENCOUNTER — Encounter: Payer: Self-pay | Admitting: Emergency Medicine

## 2021-02-24 DIAGNOSIS — H532 Diplopia: Secondary | ICD-10-CM | POA: Diagnosis not present

## 2021-02-24 DIAGNOSIS — I10 Essential (primary) hypertension: Secondary | ICD-10-CM | POA: Diagnosis not present

## 2021-02-24 DIAGNOSIS — F17210 Nicotine dependence, cigarettes, uncomplicated: Secondary | ICD-10-CM | POA: Insufficient documentation

## 2021-02-24 DIAGNOSIS — Z79899 Other long term (current) drug therapy: Secondary | ICD-10-CM | POA: Insufficient documentation

## 2021-02-24 DIAGNOSIS — Z7902 Long term (current) use of antithrombotics/antiplatelets: Secondary | ICD-10-CM | POA: Insufficient documentation

## 2021-02-24 DIAGNOSIS — I480 Paroxysmal atrial fibrillation: Secondary | ICD-10-CM | POA: Diagnosis present

## 2021-02-24 DIAGNOSIS — H02409 Unspecified ptosis of unspecified eyelid: Secondary | ICD-10-CM | POA: Diagnosis present

## 2021-02-24 DIAGNOSIS — Z20822 Contact with and (suspected) exposure to covid-19: Secondary | ICD-10-CM | POA: Insufficient documentation

## 2021-02-24 DIAGNOSIS — H02402 Unspecified ptosis of left eyelid: Secondary | ICD-10-CM | POA: Diagnosis present

## 2021-02-24 DIAGNOSIS — G459 Transient cerebral ischemic attack, unspecified: Secondary | ICD-10-CM | POA: Diagnosis present

## 2021-02-24 DIAGNOSIS — Z7901 Long term (current) use of anticoagulants: Secondary | ICD-10-CM | POA: Insufficient documentation

## 2021-02-24 DIAGNOSIS — E876 Hypokalemia: Secondary | ICD-10-CM

## 2021-02-24 DIAGNOSIS — Z8673 Personal history of transient ischemic attack (TIA), and cerebral infarction without residual deficits: Secondary | ICD-10-CM | POA: Diagnosis not present

## 2021-02-24 DIAGNOSIS — G039 Meningitis, unspecified: Secondary | ICD-10-CM

## 2021-02-24 DIAGNOSIS — Z87891 Personal history of nicotine dependence: Secondary | ICD-10-CM

## 2021-02-24 DIAGNOSIS — N179 Acute kidney failure, unspecified: Secondary | ICD-10-CM | POA: Diagnosis not present

## 2021-02-24 DIAGNOSIS — H4902 Third [oculomotor] nerve palsy, left eye: Secondary | ICD-10-CM | POA: Diagnosis not present

## 2021-02-24 DIAGNOSIS — I679 Cerebrovascular disease, unspecified: Secondary | ICD-10-CM | POA: Diagnosis present

## 2021-02-24 LAB — BASIC METABOLIC PANEL
Anion gap: 9 (ref 5–15)
BUN: 31 mg/dL — ABNORMAL HIGH (ref 8–23)
CO2: 25 mmol/L (ref 22–32)
Calcium: 9.3 mg/dL (ref 8.9–10.3)
Chloride: 102 mmol/L (ref 98–111)
Creatinine, Ser: 1.44 mg/dL — ABNORMAL HIGH (ref 0.61–1.24)
GFR, Estimated: 51 mL/min — ABNORMAL LOW (ref >60)
Glucose, Bld: 110 mg/dL — ABNORMAL HIGH (ref 70–99)
Potassium: 3.4 mmol/L — ABNORMAL LOW (ref 3.5–5.1)
Sodium: 136 mmol/L (ref 135–145)

## 2021-02-24 LAB — CSF CELL COUNT WITH DIFFERENTIAL
Eosinophils, CSF: 0 %
Lymphs, CSF: 73 %
Monocyte-Macrophage-Spinal Fluid: 19 %
RBC Count, CSF: 16113 /mm3 — ABNORMAL HIGH (ref 0–3)
Segmented Neutrophils-CSF: 8 %
Tube #: 3
WBC, CSF: 11 /mm3 (ref 0–5)

## 2021-02-24 LAB — RPR: RPR Ser Ql: NONREACTIVE

## 2021-02-24 LAB — APTT: aPTT: 27 seconds (ref 24–36)

## 2021-02-24 LAB — CBC
HCT: 45.6 % (ref 39.0–52.0)
Hemoglobin: 15 g/dL (ref 13.0–17.0)
MCH: 29.8 pg (ref 26.0–34.0)
MCHC: 32.9 g/dL (ref 30.0–36.0)
MCV: 90.7 fL (ref 80.0–100.0)
Platelets: 257 10*3/uL (ref 150–400)
RBC: 5.03 MIL/uL (ref 4.22–5.81)
RDW: 14.6 % (ref 11.5–15.5)
WBC: 9.2 10*3/uL (ref 4.0–10.5)
nRBC: 0 % (ref 0.0–0.2)

## 2021-02-24 LAB — RESP PANEL BY RT-PCR (FLU A&B, COVID) ARPGX2
Influenza A by PCR: NEGATIVE
Influenza B by PCR: NEGATIVE
SARS Coronavirus 2 by RT PCR: NEGATIVE

## 2021-02-24 LAB — PROTEIN, CSF: Total  Protein, CSF: 76 mg/dL — ABNORMAL HIGH (ref 15–45)

## 2021-02-24 LAB — C-REACTIVE PROTEIN: CRP: 0.5 mg/dL (ref <1.0)

## 2021-02-24 LAB — GLUCOSE, CSF: Glucose, CSF: 73 mg/dL — ABNORMAL HIGH (ref 40–70)

## 2021-02-24 LAB — MAGNESIUM: Magnesium: 2 mg/dL (ref 1.7–2.4)

## 2021-02-24 LAB — SEDIMENTATION RATE: Sed Rate: 6 mm/hr (ref 0–20)

## 2021-02-24 MED ORDER — HYDRALAZINE HCL 20 MG/ML IJ SOLN: 5.0000 mg | INTRAMUSCULAR | Status: DC | PRN

## 2021-02-24 MED ORDER — ACETAMINOPHEN 325 MG PO TABS
650.0000 mg | ORAL_TABLET | Freq: Four times a day (QID) | ORAL | Status: DC | PRN
  Administered 2021-02-25: 10:00:00 650 mg via ORAL
  Filled 2021-02-24: qty 2

## 2021-02-24 MED ORDER — POTASSIUM CHLORIDE IN NACL 20-0.9 MEQ/L-% IV SOLN
INTRAVENOUS | Status: DC
  Filled 2021-02-24 (×4): qty 1000

## 2021-02-24 MED ORDER — LORAZEPAM 2 MG/ML IJ SOLN: 0.5000 mg | Freq: Three times a day (TID) | INTRAMUSCULAR | Status: DC | PRN

## 2021-02-24 MED ORDER — SODIUM CHLORIDE 0.9 % IV BOLUS
500.0000 mL | Freq: Once | INTRAVENOUS | Status: AC
  Administered 2021-02-24: 14:00:00 500 mL via INTRAVENOUS

## 2021-02-24 MED ORDER — POTASSIUM CHLORIDE CRYS ER 20 MEQ PO TBCR
20.0000 meq | EXTENDED_RELEASE_TABLET | Freq: Once | ORAL | Status: AC
  Administered 2021-02-24: 14:00:00 20 meq via ORAL
  Filled 2021-02-24: qty 1

## 2021-02-24 MED ORDER — SODIUM CHLORIDE 0.9 % IV SOLN
INTRAVENOUS | Status: DC
Start: 2021-02-24 — End: 2021-02-24

## 2021-02-24 MED ORDER — APIXABAN 5 MG PO TABS
5.0000 mg | ORAL_TABLET | Freq: Two times a day (BID) | ORAL | Status: DC
  Filled 2021-02-24: qty 1

## 2021-02-24 MED ORDER — ASPIRIN EC 81 MG PO TBEC
81.0000 mg | DELAYED_RELEASE_TABLET | Freq: Every day | ORAL | Status: DC
  Administered 2021-02-24: 19:00:00 81 mg via ORAL
  Filled 2021-02-24 (×2): qty 1

## 2021-02-24 MED ORDER — ONDANSETRON HCL 4 MG/2ML IJ SOLN: 4.0000 mg | Freq: Three times a day (TID) | INTRAMUSCULAR | Status: DC | PRN

## 2021-02-24 MED ORDER — AMLODIPINE BESYLATE 5 MG PO TABS
5.0000 mg | ORAL_TABLET | Freq: Every day | ORAL | Status: DC
  Administered 2021-02-24 – 2021-02-25 (×2): 5 mg via ORAL
  Filled 2021-02-24 (×2): qty 1

## 2021-02-24 MED ORDER — NICOTINE 21 MG/24HR TD PT24
21.0000 mg | MEDICATED_PATCH | Freq: Every day | TRANSDERMAL | Status: DC
  Filled 2021-02-24: qty 1

## 2021-02-24 MED ORDER — ATORVASTATIN CALCIUM 20 MG PO TABS
40.0000 mg | ORAL_TABLET | Freq: Every day | ORAL | Status: DC
  Administered 2021-02-24 – 2021-02-25 (×2): 40 mg via ORAL
  Filled 2021-02-24 (×2): qty 2

## 2021-02-24 NOTE — ED Provider Notes (Signed)
[facility] Provider Note    Event Date/Time   First MD Initiated Contact with Patient 02/24/21 [phone]     (approximate)   History   Eye Problem   HPI  Jacob Velasquez is a 76 y.o. male with a past history of hypertension, atrial fibrillation and recent hospitalization within the last few days due to a suspected stroke who returns to the ED today at the request of neurology.  Reviewing recent records, patient was hospitalized with left facial palsy.  MRI/MRA was unremarkable, but MR of the orbits showed enhancement of the left 3rd nerve.  Patient was discharged home but neurology has instructed him to return to the ED for readmission for additional testing including lumbar puncture due to concerns for occult infection, vasculitis, or other potentially serious conditions.  Patient denies any new symptoms or issues since hospital discharge.  No severe headache.  No vision change.  No falls or trauma.  No fever, no neck pain or stiffness.     Physical Exam   Triage Vital Signs: ED Triage Vitals  Enc Vitals Group     BP 02/24/21 0802 121/75     Pulse Rate 02/24/21 0802 69     Resp 02/24/21 0802 16     Temp 02/24/21 0802 97.6 F (36.4 C)     Temp Source 02/24/21 0802 Oral     SpO2 02/24/21 0802 95 %     Weight 02/24/21 0757 216 lb 4.3 oz (98.1 kg)     Height 02/24/21 0757 6\' 2"  (1.88 m)     Head Circumference --      Peak Flow --      Pain Score 02/24/21 0757 0     Pain Loc --      Pain Edu? --      Excl. in [place]? --     Most recent vital signs: Vitals:   02/24/21 0802  BP: 121/75  Pulse: 69  Resp: 16  Temp: 97.6 F (36.4 C)  SpO2: 95%     General: Awake, no distress.  CV:  Good peripheral perfusion.  Regular rate Resp:  Normal effort.  Other:  Clear speech.  Left eye ptosis.  Symmetric eyebrow raise bilateral.   ED Results / Procedures / Treatments   Labs (all labs ordered are listed, but only abnormal results are displayed) Labs  Reviewed  RESP PANEL BY RT-PCR (FLU A&B, COVID) ARPGX2  IGG CSF INDEX  DRAW EXTRA CLOT TUBE  LYME DISEASE SEROLOGY W/REFLEX  LYME DISEASE DNA BY PCR(BORRELIA BURG)  RPR  SEDIMENTATION RATE  C-REACTIVE PROTEIN  CBC  BASIC METABOLIC PANEL  APTT     EKG     RADIOLOGY MRI from 02/22/2021 viewed and interpreted by me, shows no tumor, no intracranial hemorrhage.  Radiology report reviewed.    PROCEDURES:  Critical Care performed: No  Procedures   MEDICATIONS ORDERED IN ED: Medications  nicotine (NICODERM CQ - dosed in mg/24 hours) patch 21 mg (has no administration in time range)  ondansetron (ZOFRAN) injection 4 mg (has no administration in time range)  acetaminophen (TYLENOL) tablet 650 mg (has no administration in time range)  hydrALAZINE (APRESOLINE) injection 5 mg (has no administration in time range)     IMPRESSION / MDM / ASSESSMENT AND PLAN / ED COURSE  I reviewed the triage vital signs and the nursing notes.  Patient returns to hospital for admission as instructed by neurology.  No new acute issues.  Will discuss with hospitalist.  Neurology contacted as well.  ----------------------------------------- 9:17 AM on 02/24/2021 ----------------------------------------- Discussed with neurology Dr. Cheral Marker in the ED after his evaluation of the patient today.  Case discussed with the hospitalist.  We will proceed with admission.  Lumbar puncture and CSF labs ordered by neuro.      FINAL CLINICAL IMPRESSION(S) / ED DIAGNOSES   Final diagnoses:  Ptosis of eyelid, left     Rx / DC Orders   ED Discharge Orders     None        Note:  This document was prepared using Dragon voice recognition software and may include unintentional dictation errors.   Carrie Mew, MD 02/24/21 [phone]

## 2021-02-24 NOTE — ED Notes (Signed)
Pt is back from procedure.

## 2021-02-24 NOTE — ED Triage Notes (Signed)
Pt comes into the ED via POV and sent by Dr. Horatio Pel to come back in for re-admission.  Pt discharged yesterday prior to that MD being able to consult.  MD has informed him to come back in for a fluoro guided LP to r/o problems.  Pt in NAD at this time.

## 2021-02-24 NOTE — Assessment & Plan Note (Signed)
MRI of brain negative for stroke

## 2021-02-24 NOTE — Progress Notes (Signed)
Family and patient approached  nursing station expressing frustration and anger over the care. Meal tray brought into room by dietary according to ordered diet and patient became angry stating he was not supposed to eat. Primary MD and Neuro MD notified and clarification received to eat per MD. Notified son and he said he doesn't want [place] to talk to the primary doctor he only wants to see Dr. Cheral Marker.

## 2021-02-24 NOTE — ED Notes (Signed)
[facility] RN aware of assigned bed 

## 2021-02-24 NOTE — Plan of Care (Signed)
°  Problem: Health Behavior/Discharge Planning: Goal: Ability to manage health-related needs will improve Outcome: Progressing   Problem: Clinical Measurements: Goal: Will remain free from infection Outcome: Progressing   Problem: Clinical Measurements: Goal: Cardiovascular complication will be avoided Outcome: Progressing   Problem: Activity: Goal: Risk for activity intolerance will decrease Outcome: Progressing   Problem: Pain Managment: Goal: General experience of comfort will improve Outcome: Progressing   Problem: Safety: Goal: Ability to remain free from injury will improve Outcome: Progressing

## 2021-02-24 NOTE — Consult Note (Addendum)
NEURO HOSPITALIST CONSULT NOTE   Requestig physician: Dr. 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 male with a PMHx of HTN, TIA and currently with acute left CN3 palsy who was discharged from the hospital yesterday but was asked to return after MRI orbits with contrast revealed left CN3 enhancement. He had initially presented to the ED on Sunday evening with a c/c double visoin since awakening Sunday morning. The double vision was noted to be binocular, resolving if either eye was closed.  He denied recent injury. He did complain of sinusitis symptoms for the past 2-3 weeks with pain in the bilateral orbitofrontal region as well as in back of the nasal bridge that has improved during this admission with medication. He stated that he initially did have some pain with left eye movement, but that this component of his pain had since resolved. No symptoms or signs of orbital swelling or scleral injection.   MRI brain showed no acute lesions in the parenchyma of the brain. An MRI of orbits was then ordered and completed yesterday which revealed left CN3 enhancement. The patient was discharged prior to my being able to see him yesterday AM.    The DDx for a solitary enhancing cranial nerve is broad, including the following, which are listed in order of what I feel are highest to lowest priorities to focus on in the context of a) probability of occurrence and b) level of concern with regards to potential morbidity/mortality: Inflammatory (Tolosa-Hunt syndrome, unusual presentation of CIDP or AIDP, Behcet's syndrome, vasculitis, inflammatory granulomatosis, post-infective syndrome) Infectious (herpes zoster  virus, human herpesvirus 1,  mycobacterial, Lyme disease, neurosyphilis) Other: Atypical presentation of ophthalmoplegic migraine. Unlikely to be a schwannoma or a neurofibroma due to acute presentation, but carcinomatosis secondary to CSF seeding of a primary malignancy or nerve root infiltration by leukemia/lymphoma is possible and could present with rapid onset.   He was contacted via telephone and advised to return to the hospital for fluoro-guided LP to include the following: IgG index, oligoclonal bands to assess for inflammation, lyme titers, VDRL, cytology, fungal culture and bacterial culture in addition to protein, glucose and cell count  with differential. Also would need serum RPR, ESR and C-reactive protein drawn.   Of note, Eliquis was started during the last admission due to newly diagnosed atrial fibrillation seen on EKG as well as rhythm strip. 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 His home ASA for stroke prophylaxis was switched to Plavix the prior admission. It was noted that he does not need to be on Plavix while anticoagulated as he has no stent. I asked him to stop the Plavix. He was also advised to hold his Eliquis doses yesterday night and today AM in anticipation of LP (must wait 24 hours after Eliquis for LP per guidelines). He has been advised that this medication should be restarted after the LP. There is a minimally increased stroke risk with the held Eliquis doses; overall benefits for information gained from LP outweigh the risks.     He presented to the ED this AM for re-admission. Neurology was re-consulted.     Past Medical History:  Diagnosis Date   Atrial fibrillation ([place])    Hypertension    TIA (transient ischemic attack)     Past Surgical History:  Procedure Laterality Date   SPINAL FUSION N/A 07/30/2020   Procedure: FUSION POSTERIOR SPINAL MULTILEVEL Posterior  Thoracic Fusion T7-T11;  Surgeon: Meade Maw, MD;  Location: [facility] ORS;  Service: Neurosurgery;  Laterality: N/A;    History reviewed. No pertinent family history.              Social History:  reports that he has been smoking cigarettes. He has never used smokeless tobacco. He reports that he does not drink alcohol and does not use drugs.  No Known Allergies  MEDICATIONS:                                                                                                                     Prior to Admission:  Medications Prior to Admission  Medication Sig Dispense Refill Last Dose   atorvastatin (LIPITOR) 40 MG tablet Take 1 tablet (40 mg total) by mouth daily. 30 tablet 0 02/23/2021 at 2000   hydrochlorothiazide (HYDRODIURIL) 12.5 MG tablet Take 12.5 mg by mouth daily.   02/23/2021 at 0800   losartan (COZAAR) 50 MG tablet Take 50 mg by mouth daily.   02/23/2021 at 0800   acetaminophen (TYLENOL) 325 MG tablet Take 2 tablets (650 mg total) by mouth every 4 (four) hours as needed for mild pain (or temp > 37.5 C (99.5 F)).   prn at unknown   apixaban (ELIQUIS) 5 MG TABS tablet Take 1 tablet (5 mg total) by mouth 2 (two) times daily. 60 tablet 0    clopidogrel (PLAVIX) 75 MG tablet Take 1 tablet (75 mg total) by mouth daily. (Patient not taking: Reported on 02/24/2021) 30 tablet 0 Not Taking   dextromethorphan-guaiFENesin (MUCINEX DM) 30-600 MG 12hr tablet Take 1 tablet by mouth 2 (two) times daily. (Patient not taking: Reported on 02/24/2021)   Not Taking   fluticasone (FLONASE) 50 MCG/ACT nasal spray Place 2 sprays into both nostrils daily. (Patient not taking: Reported on 02/24/2021) 1 g 0 Not Taking   loratadine (CLARITIN) 10 MG tablet Take 1 tablet (  10 mg total) by mouth daily. (Patient not taking: Reported on 02/24/2021) 30 tablet 0 Not Taking   predniSONE (STERAPRED UNI-PAK 21 TAB) 10 MG (21) TBPK tablet Take as instructed on packaging (Patient not taking: Reported on 02/24/2021) 21 tablet 0  Not Taking   pseudoephedrine (SUDAFED) 120 MG 12 hr tablet Take 1 tablet (120 mg total) by mouth every 12 (twelve) hours as needed for congestion. (Patient not taking: Reported on 02/24/2021)   Not Taking     ROS:                                                                                                                                       No change to symptoms documented in prior notes.    Blood pressure 121/75, pulse 69, temperature 97.6 F (36.4 C), temperature source Oral, resp. rate 16, height _0  (1.88 m), weight 98.1 kg, SpO2 95 %.   General Examination:                                                                                                       Physical Exam  HEENT-  [place]/AT    Lungs- Respirations unlabored Extremities- No edema  Neurological Examination Mental Status:  Awake, alert and fully oriented. Speech fluent without evidence of aphasia. No dysarthria. Able to follow all commands without difficulty. Cranial Nerves: II: Visual fields intact bilaterally in all 4 quadrants. Pupils 3 mm, round and equally reactive.   III,IV, VI: Prominent left ptosis. Left eye  with inability to supraduct, infraduct or medially rotate past the midline. Left eye abduction is intact.   Right eye EOMI without ptosis.  V,VII: Smile symmetric, facial temp sensation normal bilaterally VIII: Hearing intact to voice IX,X: No hypophonia or hoarseness XI: Symmetric. Mild head titubation/tremor noted, which patient states is chronic.  XII: Midline tongue extension Motor: 5/5 BUE and BLE.  Sensory: FT intact x 4.  Deep Tendon Reflexes: 2+ and symmetric throughout Cerebellar: No ataxia with FNF bilaterally. Mild action tremor on the right.  Gait: Deferred   Lab Results: Basic Metabolic Panel: Recent Labs  Lab 02/21/21 1813  NA 136  K 3.6  CL 104  CO2 24  GLUCOSE 139*  BUN 17  CREATININE 1.24  CALCIUM 9.0    CBC: Recent Labs  Lab 02/21/21 1813  WBC 7.8  NEUTROABS  5.2  HGB 15.6  HCT 47.1  MCV 91.1  PLT 254    Cardiac Enzymes: No results for input(s):  CKTOTAL, CKMB, CKMBINDEX, TROPONINI in the last 168 hours.  Lipid Panel: Recent Labs  Lab 02/22/21 0439  CHOL 153  TRIG 56  HDL 52  CHOLHDL 2.9  VLDL 11  LDLCALC 90    Imaging: ECHOCARDIOGRAM COMPLETE BUBBLE STUDY  Result Date: 02/23/2021    ECHOCARDIOGRAM REPORT   Patient Name:   JADARRIUS MASELLI Date of Exam: 02/23/2021 Medical Rec #:  [id number]          Height:       74.0 in Accession #:    [id number]         Weight:       216.3 lb Date of Birth:  March 20, 1945          BSA:          2.247 m Patient Age:    89 years           BP:           152/82 mmHg Patient Gender: M                  HR:           78 bpm. Exam Location:  [facility] Procedure: 2D Echo, Color Doppler, Cardiac Doppler and Saline Contrast Bubble            Study Indications:     I63.9 Stroke  History:         Patient has prior history of Echocardiogram examinations.                  Stroke; Risk Factors:Hypertension.  Sonographer:     Charmayne Sheer Referring Phys:  [id number] Gretta Cool PATEL Diagnosing Phys: Yolonda Kida MD  Sonographer Comments: Suboptimal subcostal window. IMPRESSIONS  1. Left ventricular ejection fraction, by estimation, is 65 to 70%. The left ventricle has normal function. The left ventricle has no regional wall motion abnormalities. Left ventricular diastolic parameters are consistent with Grade III diastolic dysfunction (restrictive).  2. Right ventricular systolic function is normal. The right ventricular size is normal.  3. The mitral valve is normal in structure. Trivial mitral valve regurgitation.  4. The aortic valve is normal in structure. Aortic valve regurgitation is not visualized. FINDINGS  Left Ventricle: Left ventricular ejection fraction, by estimation, is 65 to 70%. The left ventricle has normal function. The left ventricle has no regional wall motion abnormalities. The left ventricular internal cavity size was  normal in size. There is  no left ventricular hypertrophy. Left ventricular diastolic parameters are consistent with Grade III diastolic dysfunction (restrictive). Right Ventricle: The right ventricular size is normal. No increase in right ventricular wall thickness. Right ventricular systolic function is normal. Left Atrium: Left atrial size was normal in size. Right Atrium: Right atrial size was normal in size. Pericardium: There is no evidence of pericardial effusion. Mitral Valve: The mitral valve is normal in structure. Trivial mitral valve regurgitation. MV peak gradient, 3.1 mmHg. The mean mitral valve gradient is 2.0 mmHg. Tricuspid Valve: The tricuspid valve is normal in structure. Tricuspid valve regurgitation is mild. Aortic Valve: The aortic valve is normal in structure. Aortic valve regurgitation is not visualized. Aortic valve mean gradient measures 4.0 mmHg. Aortic valve peak gradient measures 7.2 mmHg. Aortic valve area, by VTI measures 4.02 cm. Pulmonic Valve: The pulmonic valve was normal in structure. Pulmonic valve regurgitation is not visualized. Aorta: The ascending aorta was not well visualized. IAS/Shunts: No atrial level shunt detected by color flow Doppler. Agitated saline contrast  was given intravenously to evaluate for intracardiac shunting.  LEFT VENTRICLE PLAX 2D LVIDd:         4.78 cm   Diastology LVIDs:         3.06 cm   LV e' medial:    9.90 cm/s LV PW:         1.11 cm   LV E/e' medial:  8.9 LV IVS:        0.91 cm   LV e' lateral:   10.20 cm/s LVOT diam:     2.50 cm   LV E/e' lateral: 8.7 LV SV:         80 LV SV Index:   36 LVOT Area:     4.91 cm  RIGHT VENTRICLE RV Basal diam:  3.74 cm LEFT ATRIUM             Index        RIGHT ATRIUM           Index LA diam:        3.60 cm 1.60 cm/m   RA Area:     26.40 cm LA Vol (A2C):   51.4 ml 22.88 ml/m  RA Volume:   83.60 ml  37.21 ml/m LA Vol (A4C):   50.6 ml 22.52 ml/m LA Biplane Vol: 53.0 ml 23.59 ml/m  AORTIC VALVE AV Area (Vmax):     3.25 cm AV Area (Vmean):   3.63 cm AV Area (VTI):     4.02 cm AV Vmax:           134.00 cm/s AV Vmean:          85.900 cm/s AV VTI:            0.199 m AV Peak Grad:      7.2 mmHg AV Mean Grad:      4.0 mmHg LVOT Vmax:         88.80 cm/s LVOT Vmean:        63.500 cm/s LVOT VTI:          0.163 m LVOT/AV VTI ratio: 0.82  AORTA Ao Root diam: 3.20 cm MITRAL VALVE MV Area (PHT): 3.54 cm    SHUNTS MV Area VTI:   6.56 cm    Systemic VTI:  0.16 m MV Peak grad:  3.1 mmHg    Systemic Diam: 2.50 cm MV Mean grad:  2.0 mmHg MV Vmax:       0.88 m/s MV Vmean:      58.3 cm/s MV Decel Time: 214 msec MV E velocity: 88.25 cm/s MV A velocity: 34.30 cm/s MV E/A ratio:  2.57 Dwayne D Callwood MD Electronically signed by Yolonda Kida MD Signature Date/Time: 02/23/2021/2:05:37 PM    Final    MR ORBITS W WO CONTRAST  Result Date: 02/23/2021 CLINICAL DATA:  Left ophthalmoplegia EXAM: MRI OF THE ORBITS WITHOUT AND WITH CONTRAST TECHNIQUE: Multiplanar, multi-echo pulse sequences of the orbits and surrounding structures were acquired including fat saturation techniques, before and after intravenous contrast administration. CONTRAST:  7.31m GADAVIST GADOBUTROL 1 MMOL/ML IV SOLN COMPARISON:  MRI head 02/22/2021 FINDINGS: Orbits: No orbital mass or evidence of inflammation. Normal appearance of the globes, optic nerve-sheath complexes, extraocular muscles, orbital fat and lacrimal glands. Visualized sinuses: Clear. Soft tissues: Normal. Limited intracranial: Enhancement of the left third cranial nerve, extending from the cisternal portion to the cavernous sinus portion (series 14, images 11-17). The nerve is not significantly enlarged. IMPRESSION: Enhancement of the cisternal and cavernous sinus portions of  the left third cranial nerve, which is not enlarged. Electronically Signed   By: Merilyn Baba M.D.   On: 02/23/2021 04:09     Assessment:  76 year old male discharged from the hospital on Tuesday after initial diagnostic  testing for acute left CN3 palsy, re-presenting for LP after MRI orbits with contrast revealed enhancement of the left 3rd cranial nerve.  1. Exam this AM reveals no change to his left pupil-sparing CN3 palsy relative to Monday. No other deficits noted.  2. His CHAD2DS2-VASc score is 5, which corresponds to an annual stroke risk of approximately 7.2% per year.   Recommendations: 1. Fluoro-guided LP to include the following: IgG index, oligoclonal bands to assess for inflammation, lyme titers, VDRL, cytology, fungal culture and bacterial culture in addition to protein, glucose and cell count  with differential (all tests have been ordered).  2. Also will need serum RPR, ESR and C-reactive protein drawn (ordered) 3. Will need outpatient follow up with Dr. Michel Bickers of Neuroophthalmology at [facility] to confirm the diagnosis. Dr. Lehman Prom practice name and phone number have been provided to the patient.   Addendum: - Fluoro-guided LP with low volume bloody tap results. Mild elevation in WBC and protein most likely attributable to the gross blood in the CSF sample. CSF sample is non-diagnostic.  - Will need repeat fluoro-guided LP on Thursday AM. Will need to continue to hold Eliquis. ASA has been started to provide some stroke prophylaxis until LP can be completed.  - Per Radiology, the patient's low volume tap may have been due to poor hydration. IV NS with 20 meq/L of K at a rate of 125 cc/hr has been ordered.  - Can eat and drink with normal diet prior to midnight, after which he should be kept NPO.   Electronically signed: Dr. Kerney Elbe 02/24/2021, 8:54 AM

## 2021-02-24 NOTE — Procedures (Signed)
PROCEDURE SUMMARY:  Successful fluoroscopic guided Lumbar puncture. Yielded 6 ml of slightly blood tinged fluid, patient with low CSF pressure and frequent moving.  No immediate complications.  Pt tolerated well.  Would recommend if additional samples are needed to IV hydrate and repeat LP tomorrow only if needed.  Specimen was sent for labs.  EBL < 1 mL  Hedy Jacob PA-C 02/24/2021 11:47 AM

## 2021-02-24 NOTE — ED Notes (Addendum)
Pt being taken to XR at this time for lumbar puncture at this time.

## 2021-02-24 NOTE — H&P (Signed)
History and Physical    Jacob Velasquez DOB: 02/02/1945 DOA: 02/24/2021  Referring MD/NP/PA:   PCP: Baxter Hire, MD   Patient coming from:  The patient is coming from home.  At baseline, pt is independent for most of ADL.        Chief Complaint: left eye ptosis  HPI: Jacob Velasquez is a 76 y.o. male with medical history significant of TIA, hypertension, hyperlipidemia, newly diagnosis Afib on Eliquis, tobacco abuse, who presents with left eye ptosis  Pt was recently hospitalized from 01/20 - 1/24 due to left eyelid drooping and diplopia. He underwent stroke workup including CT head, MRI brain without contrast, MRA head and neck, and MRI orbits with and without contrast. It was largely negative for acute stroke. 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 Dr Cheral Marker of neurology recommended patient return to hospital for further workup including LP. Pt denies any injury. He had siusitis symptoms for the past 2-3 weeks with pain in the bilateral orbitofrontal region as well as in back of the nasal bridge that has improved during this admission with medication.  Patient does not have unilateral numbness or tinglings in extremities.  No hearing loss.  Denies chest pain, cough, shortness breath.  No fever or chills.  No nausea, vomiting, diarrhea or abdominal pain.  No symptoms of UTI.  Patient states he took his last dose of Eliquis yesterday morning   ED Course: pt was found to have WBC 9.2, potassium 3.4, AKI with creatinine 1.44, BUN with GFR 51 (baseline creatinine 1.24 and GFR > 60 on 02/21/2021), negative COVID PCR, temperature normal, blood pressure 121/75, heart rate 69, RR 16, oxygen saturation 95% on room air.  Patient is placed on MedSurg bed for position, Dr. Cheral Marker of neurology is consulted   EKG: Not done in ED, will get one  Review of Systems:   General: no fevers, chills, no body weight gain, fatigue HEENT: no blurry vision, hearing  changes or sore throat. Has left eye ptosis Respiratory: no dyspnea, coughing, wheezing CV: no chest pain, no palpitations GI: no nausea, vomiting, abdominal pain, diarrhea, constipation GU: no dysuria, burning on urination, increased urinary frequency, hematuria  Ext: no leg edema Neuro: no unilateral weakness, numbness, or tingling, hearing loss. Has left eye diplopia Skin: no rash, no skin tear. MSK: No muscle spasm, no deformity, no limitation of range of movement in spin Heme: No easy bruising.  Travel history: No recent long distant travel.   Allergy: No Known Allergies  Past Medical History:  Diagnosis Date   Atrial fibrillation ([place])    Hypertension    TIA (transient ischemic attack)     Past Surgical History:  Procedure Laterality Date   SPINAL FUSION N/A 07/30/2020   Procedure: FUSION POSTERIOR SPINAL MULTILEVEL Posterior Thoracic Fusion T7-T11;  Surgeon: Meade Maw, MD;  Location: [facility] ORS;  Service: Neurosurgery;  Laterality: N/A;    Social History:  reports that he has been smoking cigarettes. He has never used smokeless tobacco. He reports that he does not drink alcohol and does not use drugs.  Family History:  Family History  Problem Relation Age of Onset   Hypertension Mother      Prior to Admission medications   Medication Sig Start Date End Date Taking? Authorizing Provider  acetaminophen (TYLENOL) 325 MG tablet Take 2 tablets (650 mg total) by mouth every 4 (four) hours as needed for mild pain (or temp > 37.5 C (99.5 F)). 02/23/21  Richarda Osmond, MD  apixaban (ELIQUIS) 5 MG TABS tablet Take 1 tablet (5 mg total) by mouth 2 (two) times daily. 02/23/21 03/25/21  Richarda Osmond, MD  atorvastatin (LIPITOR) 40 MG tablet Take 1 tablet (40 mg total) by mouth daily. 01/02/20 07/30/20  Max Sane, MD  clopidogrel (PLAVIX) 75 MG tablet Take 1 tablet (75 mg total) by mouth daily. 02/24/21 03/26/21  Richarda Osmond, MD  dextromethorphan-guaiFENesin  [facility] DM) 30-600 MG 12hr tablet Take 1 tablet by mouth 2 (two) times daily. 02/23/21   Richarda Osmond, MD  fluticasone (FLONASE) 50 MCG/ACT nasal spray Place 2 sprays into both nostrils daily. 02/23/21 04/24/21  Richarda Osmond, MD  hydrochlorothiazide (HYDRODIURIL) 12.5 MG tablet Take 12.5 mg by mouth daily. 07/16/20   [provider]  loratadine (CLARITIN) 10 MG tablet Take 1 tablet (10 mg total) by mouth daily. 02/24/21 03/26/21  Richarda Osmond, MD  losartan (COZAAR) 50 MG tablet Take 50 mg by mouth daily. 05/25/20   [provider]  predniSONE (STERAPRED UNI-PAK 21 TAB) 10 MG (21) TBPK tablet Take as instructed on packaging 02/23/21   Richarda Osmond, MD  pseudoephedrine (SUDAFED) 120 MG 12 hr tablet Take 1 tablet (120 mg total) by mouth every 12 (twelve) hours as needed for congestion. 02/23/21   Richarda Osmond, MD    Physical Exam: Vitals:   02/24/21 0757 02/24/21 0802  BP:  121/75  Pulse:  69  Resp:  16  Temp:  97.6 F (36.4 C)  TempSrc:  Oral  SpO2:  95%  Weight: 98.1 kg   Height: $Remove'6\' 2"'pjpgfNY$  (1.88 m)    General: Not in acute distress HEENT:       Eyes: no scleral icterus.       ENT: No discharge from the ears and nose, no pharynx injection, no tonsillar enlargement.        Neck: No JVD, no bruit, no mass felt. Heme: No neck lymph node enlargement. Cardiac: S1/S2, RRR, No murmurs, No gallops or rubs. Respiratory: No rales, wheezing, rhonchi or rubs. GI: Soft, nondistended, nontender, no rebound pain, no organomegaly, BS present. GU: No hematuria Ext: No pitting leg edema bilaterally. 1+DP/PT pulse bilaterally. Musculoskeletal: No joint deformities, No joint redness or warmth, no limitation of ROM in spin. Skin: No rashes.  Neuro: Alert, oriented X3, cranial nerves II-XII grossly intact except for CN 3 nerve palsy, moves all extremities normally. Psych: Patient is not psychotic, no suicidal or hemocidal ideation.  Labs on Admission: I have  personally reviewed following labs and imaging studies  CBC: Recent Labs  Lab 02/21/21 1813 02/24/21 0930  WBC 7.8 9.2  NEUTROABS 5.2  --   HGB 15.6 15.0  HCT 47.1 45.6  MCV 91.1 90.7  PLT 254 412   Basic Metabolic Panel: Recent Labs  Lab 02/21/21 1813 02/24/21 0930  NA 136 136  K 3.6 3.4*  CL 104 102  CO2 24 25  GLUCOSE 139* 110*  BUN 17 31*  CREATININE 1.24 1.44*  CALCIUM 9.0 9.3   GFR: Estimated Creatinine Clearance: 51.5 mL/min (A) (by C-G formula based on SCr of 1.44 mg/dL (H)). Liver Function Tests: Recent Labs  Lab 02/21/21 1813  AST 21  ALT 16  ALKPHOS 68  BILITOT 1.4*  PROT 7.3  ALBUMIN 3.8   No results for input(s): LIPASE, AMYLASE in the last 168 hours. No results for input(s): AMMONIA in the last 168 hours. Coagulation Profile: Recent Labs  Lab 02/21/21 1813  INR 1.0   Cardiac Enzymes: No results for input(s): CKTOTAL, CKMB, CKMBINDEX, TROPONINI in the last 168 hours. BNP (last 3 results) No results for input(s): PROBNP in the last 8760 hours. HbA1C: Recent Labs    02/22/21 0439  HGBA1C 6.1*   CBG: Recent Labs  Lab 02/21/21 2048  GLUCAP 113*   Lipid Profile: Recent Labs    02/22/21 0439  CHOL 153  HDL 52  LDLCALC 90  TRIG 56  CHOLHDL 2.9   Thyroid Function Tests: No results for input(s): TSH, T4TOTAL, FREET4, T3FREE, THYROIDAB in the last 72 hours. Anemia Panel: No results for input(s): VITAMINB12, FOLATE, FERRITIN, TIBC, IRON, RETICCTPCT in the last 72 hours. Urine analysis:    Component Value Date/Time   COLORURINE YELLOW (A) 07/20/2019 0525   APPEARANCEUR HAZY (A) 07/20/2019 0525   LABSPEC 1.028 07/20/2019 0525   PHURINE 5.0 07/20/2019 0525   GLUCOSEU NEGATIVE 07/20/2019 0525   HGBUR NEGATIVE 07/20/2019 0525   BILIRUBINUR NEGATIVE 07/20/2019 0525   KETONESUR NEGATIVE 07/20/2019 0525   PROTEINUR NEGATIVE 07/20/2019 0525   NITRITE NEGATIVE 07/20/2019 0525   LEUKOCYTESUR NEGATIVE 07/20/2019 0525   Sepsis  Labs: $Remo'@[id number]'qXMAc$ (procalcitonin:4,lacticidven:4) ) Recent Results (from the past 240 hour(s))  Resp Panel by RT-PCR (Flu A&B, Covid) Nasopharyngeal Swab     Status: None   Collection Time: 02/24/21  9:30 AM   Specimen: Nasopharyngeal Swab; Nasopharyngeal(NP) swabs in vial transport medium  Result Value Ref Range Status   SARS Coronavirus 2 by RT PCR NEGATIVE NEGATIVE Final    Comment: (NOTE) SARS-CoV-2 target nucleic acids are NOT DETECTED.  The SARS-CoV-2 RNA is generally detectable in upper respiratory specimens during the acute phase of infection. The lowest concentration of SARS-CoV-2 viral copies this assay can detect is 138 copies/mL. A negative result does not preclude SARS-Cov-2 infection and should not be used as the sole basis for treatment or other patient management decisions. A negative result may occur with  improper specimen collection/handling, submission of specimen other than nasopharyngeal swab, presence of viral mutation(s) within the areas targeted by this assay, and inadequate number of viral copies(<138 copies/mL). A negative result must be combined with clinical observations, patient history, and epidemiological information. The expected result is Negative.  Fact Sheet for Patients:  EntrepreneurPulse.com.au  Fact Sheet for Healthcare Providers:  IncredibleEmployment.be  This test is no t yet approved or cleared by the [place] FDA and  has been authorized for detection and/or diagnosis of SARS-CoV-2 by FDA under an Emergency Use Authorization (EUA). This EUA will remain  in effect (meaning this test can be used) for the duration of the COVID-19 declaration under Section 564(b)(1) of the Act, 21 U.S.C.section 360bbb-3(b)(1), unless the authorization is terminated  or revoked sooner.       Influenza A by PCR NEGATIVE NEGATIVE Final   Influenza B by PCR NEGATIVE NEGATIVE Final    Comment: (NOTE) The Xpert Xpress  SARS-CoV-2/FLU/RSV plus assay is intended as an aid in the diagnosis of influenza from Nasopharyngeal swab specimens and should not be used as a sole basis for treatment. Nasal washings and aspirates are unacceptable for Xpert Xpress SARS-CoV-2/FLU/RSV testing.  Fact Sheet for Patients: EntrepreneurPulse.com.au  Fact Sheet for Healthcare Providers: IncredibleEmployment.be  This test is not yet approved or cleared by the [place] FDA and has been authorized for detection and/or diagnosis of SARS-CoV-2 by FDA under an Emergency Use Authorization (EUA). This EUA will remain in effect (meaning this test can be used) for the duration of the  COVID-19 declaration under Section 564(b)(1) of the Act, 21 U.S.C. section 360bbb-3(b)(1), unless the authorization is terminated or revoked.  Performed at [facility], [address] [place], [place] [address]      Radiological Exams on Admission: ECHOCARDIOGRAM COMPLETE BUBBLE STUDY  Result Date: 02/23/2021    ECHOCARDIOGRAM REPORT   Patient Name:   Jacob Velasquez Date of Exam: 02/23/2021 Medical Rec #:  [id number]          Height:       74.0 in Accession #:    [id number]         Weight:       216.3 lb Date of Birth:  02/02/1945          BSA:          2.247 m Patient Age:    51 years           BP:           152/82 mmHg Patient Gender: M                  HR:           78 bpm. Exam Location:  [facility] Procedure: 2D Echo, Color Doppler, Cardiac Doppler and Saline Contrast Bubble            Study Indications:     I63.9 Stroke  History:         Patient has prior history of Echocardiogram examinations.                  Stroke; Risk Factors:Hypertension.  Sonographer:     Charmayne Sheer Referring Phys:  [id number] Gretta Cool PATEL Diagnosing Phys: Yolonda Kida MD  Sonographer Comments: Suboptimal subcostal window. IMPRESSIONS  1. Left ventricular ejection fraction, by estimation, is 65 to 70%. The left ventricle has  normal function. The left ventricle has no regional wall motion abnormalities. Left ventricular diastolic parameters are consistent with Grade III diastolic dysfunction (restrictive).  2. Right ventricular systolic function is normal. The right ventricular size is normal.  3. The mitral valve is normal in structure. Trivial mitral valve regurgitation.  4. The aortic valve is normal in structure. Aortic valve regurgitation is not visualized. FINDINGS  Left Ventricle: Left ventricular ejection fraction, by estimation, is 65 to 70%. The left ventricle has normal function. The left ventricle has no regional wall motion abnormalities. The left ventricular internal cavity size was normal in size. There is  no left ventricular hypertrophy. Left ventricular diastolic parameters are consistent with Grade III diastolic dysfunction (restrictive). Right Ventricle: The right ventricular size is normal. No increase in right ventricular wall thickness. Right ventricular systolic function is normal. Left Atrium: Left atrial size was normal in size. Right Atrium: Right atrial size was normal in size. Pericardium: There is no evidence of pericardial effusion. Mitral Valve: The mitral valve is normal in structure. Trivial mitral valve regurgitation. MV peak gradient, 3.1 mmHg. The mean mitral valve gradient is 2.0 mmHg. Tricuspid Valve: The tricuspid valve is normal in structure. Tricuspid valve regurgitation is mild. Aortic Valve: The aortic valve is normal in structure. Aortic valve regurgitation is not visualized. Aortic valve mean gradient measures 4.0 mmHg. Aortic valve peak gradient measures 7.2 mmHg. Aortic valve area, by VTI measures 4.02 cm. Pulmonic Valve: The pulmonic valve was normal in structure. Pulmonic valve regurgitation is not visualized. Aorta: The ascending aorta was not well visualized. IAS/Shunts: No atrial level shunt detected by color flow Doppler. Agitated saline  contrast was given intravenously to evaluate  for intracardiac shunting.  LEFT VENTRICLE PLAX 2D LVIDd:         4.78 cm   Diastology LVIDs:         3.06 cm   LV e' medial:    9.90 cm/s LV PW:         1.11 cm   LV E/e' medial:  8.9 LV IVS:        0.91 cm   LV e' lateral:   10.20 cm/s LVOT diam:     2.50 cm   LV E/e' lateral: 8.7 LV SV:         80 LV SV Index:   36 LVOT Area:     4.91 cm  RIGHT VENTRICLE RV Basal diam:  3.74 cm LEFT ATRIUM             Index        RIGHT ATRIUM           Index LA diam:        3.60 cm 1.60 cm/m   RA Area:     26.40 cm LA Vol (A2C):   51.4 ml 22.88 ml/m  RA Volume:   83.60 ml  37.21 ml/m LA Vol (A4C):   50.6 ml 22.52 ml/m LA Biplane Vol: 53.0 ml 23.59 ml/m  AORTIC VALVE AV Area (Vmax):    3.25 cm AV Area (Vmean):   3.63 cm AV Area (VTI):     4.02 cm AV Vmax:           134.00 cm/s AV Vmean:          85.900 cm/s AV VTI:            0.199 m AV Peak Grad:      7.2 mmHg AV Mean Grad:      4.0 mmHg LVOT Vmax:         88.80 cm/s LVOT Vmean:        63.500 cm/s LVOT VTI:          0.163 m LVOT/AV VTI ratio: 0.82  AORTA Ao Root diam: 3.20 cm MITRAL VALVE MV Area (PHT): 3.54 cm    SHUNTS MV Area VTI:   6.56 cm    Systemic VTI:  0.16 m MV Peak grad:  3.1 mmHg    Systemic Diam: 2.50 cm MV Mean grad:  2.0 mmHg MV Vmax:       0.88 m/s MV Vmean:      58.3 cm/s MV Decel Time: 214 msec MV E velocity: 88.25 cm/s MV A velocity: 34.30 cm/s MV E/A ratio:  2.57 Dwayne D Callwood MD Electronically signed by Yolonda Kida MD Signature Date/Time: 02/23/2021/2:05:37 PM    Final    MR ORBITS W WO CONTRAST  Result Date: 02/23/2021 CLINICAL DATA:  Left ophthalmoplegia EXAM: MRI OF THE ORBITS WITHOUT AND WITH CONTRAST TECHNIQUE: Multiplanar, multi-echo pulse sequences of the orbits and surrounding structures were acquired including fat saturation techniques, before and after intravenous contrast administration. CONTRAST:  7.43mL GADAVIST GADOBUTROL 1 MMOL/ML IV SOLN COMPARISON:  MRI head 02/22/2021 FINDINGS: Orbits: No orbital mass or evidence of  inflammation. Normal appearance of the globes, optic nerve-sheath complexes, extraocular muscles, orbital fat and lacrimal glands. Visualized sinuses: Clear. Soft tissues: Normal. Limited intracranial: Enhancement of the left third cranial nerve, extending from the cisternal portion to the cavernous sinus portion (series 14, images 11-17). The nerve is not significantly enlarged. IMPRESSION: Enhancement of the cisternal and cavernous sinus  portions of the left third cranial nerve, which is not enlarged. Electronically Signed   By: Merilyn Baba M.D.   On: 02/23/2021 04:09      Assessment/Plan Principal Problem:   Diplopia Active Problems:   3rd cranial nerve palsy, left   AF (paroxysmal atrial fibrillation) (HCC)   Essential hypertension   History of tobacco abuse   TIA (transient ischemic attack)   AKI (acute kidney injury) (HCC)   Hypokalemia   Diplopia and 3rd cranial nerve palsy, left: Etiology is not clear.  Recent work-up is negative for stroke. 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  Differential diagnosis is very broad. Per Dr. Cheral Marker, it will include " inflammatory (Tolosa-Hunt syndrome, unusual presentation of CIDP or AIDP, Behcet's syndrome, vasculitis, inflammatory granulomatosis, post-infective syndrome), Infectious (herpes zoster virus, human herpesvirus 1,  mycobacterial, Lyme disease, neurosyphilis), Other: Atypical presentation of ophthalmoplegic migraine". Dr Cheral Marker of neurology recommended LP for further work-up, including "IgG index, oligoclonal bands to assess for inflammation, lyme titers, VDRL, cytology, fungal culture and bacterial culture in addition to protein, glucose and cell count  with differential. Also will need serum RPR, ESR and C-reactive protein drawn" Per Dr. Cheral Marker.  -will place on med-surg bed for obs -LP is done by IR -f/u CSF analysis.   AF (paroxysmal atrial fibrillation) ([place]): HR 69 -- restart Eliquis at the 6-hour after LP  is performed per Dr. Cheral Marker. Will start Eliquis at 18:00 PM.  Essential hypertension -H hold HCTZ and Cozaar due to AKI -will start amlodipine 5 mg daily now -IV hydralazine as needed  History of tobacco abuse -nicotine pathch  TIA (transient ischemic attack) - Lipitor -will restart Eliquis at 6:00 PM  AKI (acute kidney injury) ([place]) -hold HCTZ and Cozaar -IV fluid 500 cc normal saline  Hypokalemia: Potassium 3.4 -Repleted potassium -Check magnesium level     DVT ppx: on Eliquis Code Status: Full code (discussed with patient, I explained the meaning of CODE STATUS, patient wants to be full code) Family Communication: Yes, patient's son   at bed side.    Disposition Plan:  Anticipate discharge back to previous environment Consults called:  Dr. Cheral Marker Admission status and Level of care: Telemetry Medical:   for obs    Status is: Observation  The patient remains OBS appropriate and will d/c before 2 midnights.         Date of Service 02/24/2021    [facility]   If 7PM-7AM, please contact night-coverage [email or web address] 02/24/2021, 11:57 AM

## 2021-02-25 ENCOUNTER — Inpatient Hospital Stay: Payer: Medicare Other

## 2021-02-25 DIAGNOSIS — H02409 Unspecified ptosis of unspecified eyelid: Secondary | ICD-10-CM | POA: Diagnosis present

## 2021-02-25 DIAGNOSIS — H532 Diplopia: Secondary | ICD-10-CM | POA: Diagnosis not present

## 2021-02-25 DIAGNOSIS — H4902 Third [oculomotor] nerve palsy, left eye: Secondary | ICD-10-CM | POA: Diagnosis not present

## 2021-02-25 LAB — CSF CELL COUNT WITH DIFFERENTIAL
Eosinophils, CSF: 0 %
Lymphs, CSF: 38 %
Monocyte-Macrophage-Spinal Fluid: 15 %
RBC Count, CSF: 9792 /mm3 — ABNORMAL HIGH (ref 0–3)
Segmented Neutrophils-CSF: 47 %
Tube #: 3
WBC, CSF: 21 /mm3 (ref 0–5)

## 2021-02-25 LAB — BASIC METABOLIC PANEL
Anion gap: 5 (ref 5–15)
BUN: 25 mg/dL — ABNORMAL HIGH (ref 8–23)
CO2: 25 mmol/L (ref 22–32)
Calcium: 8.3 mg/dL — ABNORMAL LOW (ref 8.9–10.3)
Chloride: 106 mmol/L (ref 98–111)
Creatinine, Ser: 1.13 mg/dL (ref 0.61–1.24)
GFR, Estimated: >60 mL/min (ref >60)
Glucose, Bld: 98 mg/dL (ref 70–99)
Potassium: 4 mmol/L (ref 3.5–5.1)
Sodium: 136 mmol/L (ref 135–145)

## 2021-02-25 LAB — LYME DISEASE SEROLOGY W/REFLEX: Lyme Total Antibody EIA: NEGATIVE

## 2021-02-25 LAB — CYTOLOGY - NON PAP

## 2021-02-25 LAB — PROTEIN, CSF: Total  Protein, CSF: 99 mg/dL — ABNORMAL HIGH (ref 15–45)

## 2021-02-25 LAB — GLUCOSE, CSF: Glucose, CSF: 48 mg/dL (ref 40–70)

## 2021-02-25 MED ORDER — PREDNISONE 10 MG PO TABS: ORAL_TABLET | ORAL | 0 refills | Status: DC

## 2021-02-25 MED ORDER — PREDNISONE 50 MG PO TABS: 60.0000 mg | ORAL_TABLET | Freq: Every day | ORAL | Status: DC

## 2021-02-25 MED ORDER — POLYETHYLENE GLYCOL 3350 17 G PO PACK
17.0000 g | PACK | Freq: Every day | ORAL | Status: DC
  Administered 2021-02-25: 03:00:00 17 g via ORAL
  Filled 2021-02-25: qty 1

## 2021-02-25 NOTE — Care Management CC44 (Signed)
Condition Code 44 Documentation Completed  Patient Details  Name: Jacob Velasquez MRN: [id number] Date of Birth: Oct 27, 1945   Condition Code 44 given:  Yes Patient signature on Condition Code 44 notice:  Yes Documentation of 2 MD's agreement:  Yes Code 44 added to claim:  Yes    Candie Chroman, LCSW 02/25/2021, 4:46 PM

## 2021-02-25 NOTE — Progress Notes (Addendum)
Subjective: No changes to his symptoms of diplopia and ptosis.   Objective: Current vital signs: BP (!) 140/92 (BP Location: Left Arm)    Pulse 69    Temp 97.8 F (36.6 C) (Oral)    Resp 16    Ht $R'6\' 2"'Zq$  (1.88 m)    Wt 98.1 kg    SpO2 96%    BMI 27.77 kg/m  Vital signs in last 24 hours: Temp:  [97.8 F (36.6 C)-98.4 F (36.9 C)] 97.8 F (36.6 C) (01/26 0815) Pulse Rate:  [63-82] 69 (01/26 0815) Resp:  [16-20] 16 (01/26 0815) BP: (131-149)/(80-95) 140/92 (01/26 0815) SpO2:  [95 %-100 %] 96 % (01/26 0815)  Intake/Output from previous day: 01/25 0701 - 01/26 0700 In: 2187.2 [I.V.:1186.7; IV Piggyback:1000.5] Out: 0  Intake/Output this shift: Total I/O In: -  Out: 350 [Urine:350] Nutritional status:  Diet Order             Diet NPO time specified Except for: [employer], Sips with Meds  Diet effective midnight                   Physical Exam  HEENT-  [place]/AT    Lungs- Respirations unlabored Extremities- No edema   Neurological Examination Mental Status:  Awake, alert and fully oriented. Speech fluent without evidence of aphasia. No dysarthria. Able to follow all commands without difficulty. Cranial Nerves: II: Visual fields intact. Pupils 3 mm, round and equally reactive.   III,IV, VI: Prominent left ptosis. Left eye  with inability to supraduct, infraduct or medially rotate past the midline. Left eye abduction is intact.   Right eye EOMI without ptosis.  VII: Smile symmetric VIII: Hearing intact to voice IX,X: No hypophonia or hoarseness XI: Symmetric.   Motor: Normal strength. .  Cerebellar: No gross ataxia noted.   Gait: Normal gait and station.   Lab Results: Results for orders placed or performed during the hospital encounter of 02/24/21 (from the past 48 hour(s))  RPR     Status: None   Collection Time: 02/24/21  9:30 AM  Result Value Ref Range   RPR Ser Ql NON REACTIVE NON REACTIVE    Comment: Performed at [facility], [address],  [place], [place] [address]  Sedimentation rate     Status: None   Collection Time: 02/24/21  9:30 AM  Result Value Ref Range   Sed Rate 6 0 - 20 mm/hr    Comment: Performed at [facility], [place], [place] [address]  C-reactive protein     Status: None   Collection Time: 02/24/21  9:30 AM  Result Value Ref Range   CRP 0.5 <1.0 mg/dL    Comment: Performed at [facility], [place] [address] [place], [place] [address]  Resp Panel by RT-PCR (Flu A&B, Covid) Nasopharyngeal Swab     Status: None   Collection Time: 02/24/21  9:30 AM   Specimen: Nasopharyngeal Swab; Nasopharyngeal(NP) swabs in vial transport medium  Result Value Ref Range   SARS Coronavirus 2 by RT PCR NEGATIVE NEGATIVE    Comment: (NOTE) SARS-CoV-2 target nucleic acids are NOT DETECTED.  The SARS-CoV-2 RNA is generally detectable in upper respiratory specimens during the acute phase of infection. The lowest concentration of SARS-CoV-2 viral copies this assay can detect is 138 copies/mL. A negative result does not preclude SARS-Cov-2 infection and should not be used as the sole basis for treatment or other patient management decisions. A negative result may  occur with  improper specimen collection/handling, submission of specimen other than nasopharyngeal swab, presence of viral mutation(s) within the areas targeted by this assay, and inadequate number of viral copies(<138 copies/mL). A negative result must be combined with clinical observations, patient history, and epidemiological information. The expected result is Negative.  Fact Sheet for Patients:  EntrepreneurPulse.com.au  Fact Sheet for Healthcare Providers:  IncredibleEmployment.be  This test is no t yet approved or cleared by the [place] FDA and  has been authorized for detection and/or diagnosis of SARS-CoV-2 by FDA under an Emergency Use Authorization (EUA). This EUA will remain  in effect  (meaning this test can be used) for the duration of the COVID-19 declaration under Section 564(b)(1) of the Act, 21 U.S.C.section 360bbb-3(b)(1), unless the authorization is terminated  or revoked sooner.       Influenza A by PCR NEGATIVE NEGATIVE   Influenza B by PCR NEGATIVE NEGATIVE    Comment: (NOTE) The Xpert Xpress SARS-CoV-2/FLU/RSV plus assay is intended as an aid in the diagnosis of influenza from Nasopharyngeal swab specimens and should not be used as a sole basis for treatment. Nasal washings and aspirates are unacceptable for Xpert Xpress SARS-CoV-2/FLU/RSV testing.  Fact Sheet for Patients: EntrepreneurPulse.com.au  Fact Sheet for Healthcare Providers: IncredibleEmployment.be  This test is not yet approved or cleared by the [place] FDA and has been authorized for detection and/or diagnosis of SARS-CoV-2 by FDA under an Emergency Use Authorization (EUA). This EUA will remain in effect (meaning this test can be used) for the duration of the COVID-19 declaration under Section 564(b)(1) of the Act, 21 U.S.C. section 360bbb-3(b)(1), unless the authorization is terminated or revoked.  Performed at [facility] [address] [place], [place] [address]   CBC     Status: None   Collection Time: 02/24/21  9:30 AM  Result Value Ref Range   WBC 9.2 4.0 - 10.5 K/uL   RBC 5.03 4.22 - 5.81 MIL/uL   Hemoglobin 15.0 13.0 - 17.0 g/dL   HCT 45.6 39.0 - 52.0 %   MCV 90.7 80.0 - 100.0 fL   MCH 29.8 26.0 - 34.0 pg   MCHC 32.9 30.0 - 36.0 g/dL   RDW 14.6 11.5 - 15.5 %   Platelets 257 150 - 400 K/uL   nRBC 0.0 0.0 - 0.2 %    Comment: Performed at [facility], [address] [place], [place] [address]  Basic metabolic panel     Status: Abnormal   Collection Time: 02/24/21  9:30 AM  Result Value Ref Range   Sodium 136 135 - 145 mmol/L   Potassium 3.4 (L) 3.5 - 5.1 mmol/L   Chloride 102 98 - 111 mmol/L   CO2 25  22 - 32 mmol/L   Glucose, Bld 110 (H) 70 - 99 mg/dL    Comment: Glucose reference range applies only to samples taken after fasting for at least 8 hours.   BUN 31 (H) 8 - 23 mg/dL   Creatinine, Ser 1.44 (H) 0.61 - 1.24 mg/dL   Calcium 9.3 8.9 - 10.3 mg/dL   GFR, Estimated 51 (L) >60 mL/min    Comment: (NOTE) Calculated using the CKD-EPI Creatinine Equation (2021)    Anion gap 9 5 - 15    Comment: Performed at [facility], [address] [place], [place] [address]  APTT     Status: None   Collection Time: 02/24/21  9:30 AM  Result Value Ref Range   aPTT 27  24 - 36 seconds    Comment: Performed at [facility], [address] [place], [place] [address]  Magnesium     Status: None   Collection Time: 02/24/21  9:30 AM  Result Value Ref Range   Magnesium 2.0 1.7 - 2.4 mg/dL    Comment: Performed at [facility], [address] [place], [place] [address]  Glucose, CSF     Status: Abnormal   Collection Time: 02/24/21 11:16 AM  Result Value Ref Range   Glucose, CSF 73 (H) 40 - 70 mg/dL    Comment: Performed at [facility], [address] [place], [place] [address]  Protein, CSF     Status: Abnormal   Collection Time: 02/24/21 11:16 AM  Result Value Ref Range   Total  Protein, CSF 76 (H) 15 - 45 mg/dL    Comment: Performed at [facility], [address] [place], [place] [address]  CSF cell count with differential     Status: Abnormal   Collection Time: 02/24/21 11:16 AM  Result Value Ref Range   Tube # 3    Color, CSF PINK (A) COLORLESS   Appearance, CSF HAZY (A) CLEAR   Supernatant CLEAR    RBC Count, CSF 16,113 (H) 0 - 3 /cu mm   WBC, CSF 11 (HH) 0 - 5 /cu mm    Comment: CRITICAL RESULT CALLED TO, READ BACK BY AND VERIFIED WITH: PAISLEY DEMAIO, RN 1300 02/24/21 GM    Segmented Neutrophils-CSF 8 %   Lymphs, CSF 73 %   Monocyte-Macrophage-Spinal Fluid 19 %   Eosinophils, CSF 0 %    Comment: Performed at [facility], [address] [place], [place] [address]  CSF culture w Gram Stain     Status: None (Preliminary result)   Collection Time: 02/24/21 11:16 AM   Specimen: PATH Cytology CSF; Cerebrospinal Fluid  Result Value Ref Range   Specimen Description      CSF Performed at [facility], [address], [place], [place] [address]    Special Requests      NONE Performed at [facility], [address] [place], [place] [address]    Gram Stain      NO ORGANISMS SEEN WBC SEEN RED BLOOD CELLS GRAM STAIN REVIEWED-AGREE WITH RESULT    Culture      NO GROWTH < 12 HOURS Performed at [place] [address], [place], [place] [address]    Report Status PENDING   Basic metabolic panel     Status: Abnormal   Collection Time: 02/25/21  7:43 AM  Result Value Ref Range   Sodium 136 135 - 145 mmol/L   Potassium 4.0 3.5 - 5.1 mmol/L   Chloride 106 98 - 111 mmol/L   CO2 25 22 - 32 mmol/L   Glucose, Bld 98 70 - 99 mg/dL    Comment: Glucose reference range applies only to samples taken after fasting for at least 8 hours.   BUN 25 (H) 8 - 23 mg/dL   Creatinine, Ser 1.13 0.61 - 1.24 mg/dL   Calcium 8.3 (L) 8.9 - 10.3 mg/dL   GFR, Estimated >60 >60 mL/min    Comment: (NOTE) Calculated using the CKD-EPI Creatinine Equation (2021)    Anion gap 5 5 - 15    Comment: Performed at [facility], [address] [place],  [address]    Recent Results (from the past 240 hour(s))  Resp Panel by RT-PCR (  Flu A&B, Covid) Nasopharyngeal Swab     Status: None   Collection Time: 02/24/21  9:30 AM   Specimen: Nasopharyngeal Swab; Nasopharyngeal(NP) swabs in vial transport medium  Result Value Ref Range Status   SARS Coronavirus 2 by RT PCR NEGATIVE NEGATIVE Final    Comment: (NOTE) SARS-CoV-2 target nucleic acids are NOT DETECTED.  The SARS-CoV-2 RNA is generally detectable in upper respiratory specimens during the acute phase of infection. The  lowest concentration of SARS-CoV-2 viral copies this assay can detect is 138 copies/mL. A negative result does not preclude SARS-Cov-2 infection and should not be used as the sole basis for treatment or other patient management decisions. A negative result may occur with  improper specimen collection/handling, submission of specimen other than nasopharyngeal swab, presence of viral mutation(s) within the areas targeted by this assay, and inadequate number of viral copies(<138 copies/mL). A negative result must be combined with clinical observations, patient history, and epidemiological information. The expected result is Negative.  Fact Sheet for Patients:  EntrepreneurPulse.com.au  Fact Sheet for Healthcare Providers:  IncredibleEmployment.be  This test is no t yet approved or cleared by the [place] FDA and  has been authorized for detection and/or diagnosis of SARS-CoV-2 by FDA under an Emergency Use Authorization (EUA). This EUA will remain  in effect (meaning this test can be used) for the duration of the COVID-19 declaration under Section 564(b)(1) of the Act, 21 U.S.C.section 360bbb-3(b)(1), unless the authorization is terminated  or revoked sooner.       Influenza A by PCR NEGATIVE NEGATIVE Final   Influenza B by PCR NEGATIVE NEGATIVE Final    Comment: (NOTE) The Xpert Xpress SARS-CoV-2/FLU/RSV plus assay is intended as an aid in the diagnosis of influenza from Nasopharyngeal swab specimens and should not be used as a sole basis for treatment. Nasal washings and aspirates are unacceptable for Xpert Xpress SARS-CoV-2/FLU/RSV testing.  Fact Sheet for Patients: EntrepreneurPulse.com.au  Fact Sheet for Healthcare Providers: IncredibleEmployment.be  This test is not yet approved or cleared by the [place] FDA and has been authorized for detection and/or diagnosis of SARS-CoV-2 by FDA under  an Emergency Use Authorization (EUA). This EUA will remain in effect (meaning this test can be used) for the duration of the COVID-19 declaration under Section 564(b)(1) of the Act, 21 U.S.C. section 360bbb-3(b)(1), unless the authorization is terminated or revoked.  Performed at [facility], [address] [place], [place] [address]   CSF culture w Gram Stain     Status: None (Preliminary result)   Collection Time: 02/24/21 11:16 AM   Specimen: PATH Cytology CSF; Cerebrospinal Fluid  Result Value Ref Range Status   Specimen Description   Final    CSF Performed at [facility], [address] [place], [place] [address]    Special Requests   Final    NONE Performed at [facility], [place], [place] [address]    Gram Stain   Final    NO ORGANISMS SEEN WBC SEEN RED BLOOD CELLS GRAM STAIN REVIEWED-AGREE WITH RESULT    Culture   Final    NO GROWTH < 12 HOURS Performed at [facility], [place] [address], [place], [place] [address]    Report Status PENDING  Incomplete    Lipid Panel No results for input(s): CHOL, TRIG, HDL, CHOLHDL, VLDL, LDLCALC in the last 72 hours.  Studies/Results: DG FL GUIDED LUMBAR PUNCTURE  Result Date: 02/24/2021 CLINICAL DATA:  Patient with left  eye ptosis and diplopia concern for meningitis request received for diagnostic lumbar puncture. EXAM: DIAGNOSTIC LUMBAR PUNCTURE UNDER FLUOROSCOPIC GUIDANCE COMPARISON:  CT head imaging performed 02/21/2021 and MRI brain imaging performed 02/22/2021 report reviewed prior to the procedure. FLUOROSCOPY TIME:  Fluoroscopy Time:  0.3 minute Radiation Exposure Index (if provided by the fluoroscopic device): 9.20 mGy Number of Acquired Spot Images: 3 PROCEDURE: Informed consent was obtained from the patient/patient's legal guardian prior to the procedure, including potential complications of bleeding, infection, paresthesias, nerve damage, CSF leak requiring additional  procedures, post procedure requirement to lay flat for several hours after the procedure, headache, allergy, and pain. With the patient prone, the lower back was prepped with Betadine. 1% Lidocaine was used for local anesthesia. Lumbar puncture was performed at the L5-S1 level using a 20 gauge needle with return of slightly blood-tinged CSF secondary to patient moving. Six ml of CSF were obtained for laboratory studies, unable to collect additional fluid due to low CSF pressure. The inner stylet was placed back into the needle and the needle was removed in its entirety. The patient tolerated the procedure well and there were no apparent complications. A sterile bandage was applied. IMPRESSION: Technically successful fluoroscopic guided lumbar puncture. This exam was performed by Tsosie Billing PA-C, and was supervised and interpreted by Dr. Nyoka Cowden. Electronically Signed   By: Marijo Conception M.D.   On: 02/24/2021 12:12    Medications: Scheduled:  amLODipine  5 mg Oral Daily   apixaban  5 mg Oral BID   aspirin EC  81 mg Oral Daily   atorvastatin  40 mg Oral Daily   nicotine  21 mg Transdermal Daily   polyethylene glycol  17 g Oral Daily   Continuous:  0.9 % NaCl with KCl 20 mEq / L 30 mL/hr at 02/25/21 7412    Assessment:  76 year old male with newly diagnosed atrial fibrillation discharged from the hospital on Tuesday after initial diagnostic testing for acute left CN3 palsy, re-presenting for LP after MRI orbits with contrast revealed enhancement of the left 3rd cranial nerve. First attempt at LP under fluoro yesterday (Wednesday) was unsuccessful, with low volume bloody tap that was nondiagnostic. Second LP attempt today.  1. Exam this AM reveals no change to his left pupil-sparing CN3 palsy relative to Monday. No other deficits noted.  2. His CHAD2DS2-VASc score is 5, which corresponds to an annual stroke risk of approximately 7.2% per year.  3. RPR non-reactive 4. ESR normal. C-reactive protein  normal.    Recommendations: 1. Repeat attempt at fluoro-guided LP to include the following: IgG index, oligoclonal bands to assess for inflammation, lyme titers, VDRL, cytology, fungal culture and bacterial culture in addition to protein, glucose and cell count  with differential (all tests have been ordered). May need to be attempted at one level higher or lower than the initial attempt. Query fibrosis at the prior attempted level resulting in the bloody tap. Of note, Eliquis has been held for 48 hours and the patient has been hydrated IV overnight at a rate of 125 cc/hr to improve likelihood of a successful tap.  2. Will need outpatient follow up with Dr. Michel Bickers of Neuroophthalmology at [facility] after discharge. Dr. Lehman Prom practice name and phone number have been provided to the patient.  3. Radiology has been called. He is on the list for repeat LP attempt today.    Addendum: - LP completed with blood in second tap. WBC 21 but estimated artifactual  WBC increase due to RBC count of 9792 (ratio of 1 WBC per 700 RBC) would be 14, therefore estimated CSF WBC per microliter if the tap was not bloody would be 7, which is slightly greater than the normal range.  - Estimated artifactual increase in CSF protein content due to the blood (ratio of 1 mg/dL protein for every 1000 cell increase in CSF RBC count) would be 89, which is significantly abnormally high (twice the upper range of normal).  - Given that the patient has no infectious symptoms or fever, as well as normal peripheral blood leukocyte count, the CSF findings are not consistent with infection, but the elevated protein count is concerning for an inflammatory process. Microvascular ischemia (risk factors include HTN and atherosclerosis) that is not inflammatory in nature is still relatively high on the DDx despite the left CN3 enhancement seen on MRI. However, benefit/risk profile overall is felt to favor empiric steroid  treatment as an inflammatory etiology is felt to be highest on the DDx.  - May benefit from oral steroid followed by tapering for total of 2 weeks based on case reports in the literature. Recommend prednisone 60 mg po qd x 9 days followed by tapering by 10 mg per day (50, 40, 30, 20, 10, then stop).  - Will need follow up of remaining CSF labs, most of which will take > 24 hours to result.  - Will need follow up appointment with Neuroophthalmologist Dr. Chinita Pester of [facility]. Long term follow up after that with Dr. Humphrey Rolls of Neurology at the [facility].   Initial CSF results:    LOS: 0 days   _0  signed: Dr. Kerney Elbe 02/25/2021  10:05 AM

## 2021-02-25 NOTE — Evaluation (Signed)
Occupational Therapy Evaluation Patient Details Name: Jacob Velasquez MRN: [id number] DOB: 09-30-1945 Today's Date: 02/25/2021   History of Present Illness Pt is a 76 y/o M with PMH: CVA on aspirin, HTN, DLD. Pt also with recent adm to [facility] on 1/22 d/t double vision. He was w/u for stroke which was r/o on imaging. He re-presented at the advice of Neurologist who was consulted on pt, but unable to f/u before d/c on 1/24. Recommended for fluoro guided LP. Pt still with ptosis of L eyelid.   Clinical Impression   Pt seen for OT evaluation this date in setting of acute hospitalization d/t continued concerns r/t L eyelid/vision. Pt appropriate with all commands, reports being INDEP at baseline, and presents with no balance deficits this date. When occluding his L eye, he does report some short-lived relief from diplopia. He is able to get by w/o occluding but admits that it can be irritating and disorienting after a period of time. However, attempted use of eye-patch last admission with OT, with little reported improvement. OT ed with pt re: oculomotor ROM of L eye to prevent atrophy. No other functional deficits that can be addressed in an acute setting by OT remain at this time. No f/u needs perceived. Will complete OT order at this time as pt is able to complete fxl mobility and ADLs with MOD I. Did complete safety education with pt and recommending pt have assist for all transportation and building entrances until/if further improvement of vision can be obtained. Pt with good understanding.     Recommendations for follow up therapy are one component of a multi-disciplinary discharge planning process, led by the attending physician.  Recommendations may be updated based on patient status, additional functional criteria and insurance authorization.   Follow Up Recommendations  No OT follow up    Assistance Recommended at Discharge Set up Supervision/Assistance  Patient can return home with the  following Assist for transportation;Help with stairs or ramp for entrance    Functional Status Assessment  Patient has not had a recent decline in their functional status  Equipment Recommendations  None recommended by OT    Recommendations for Other Services       Precautions / Restrictions Precautions Precautions: None Restrictions Weight Bearing Restrictions: No      Mobility Bed Mobility Overal bed mobility: Independent                  Transfers Overall transfer level: Independent                        Balance Overall balance assessment: Independent   Sitting balance-Leahy Scale: Normal       Standing balance-Leahy Scale: Good                             ADL either performed or assessed with clinical judgement   ADL Overall ADL's : Independent;At baseline                                             Vision   Vision Assessment?: Yes Tracking/Visual Pursuits: Decreased smoothness of eye movement to LEFT superior field;Decreased smoothness of eye movement to LEFT inferior field;Requires cues, head turns, or add eye shifts to track;Left eye does not track laterally;Left eye does not track medially Saccades:  Additional eye shifts occurred during testing;Additional head turns occurred during testing;Decreased speed of saccadic movement Diplopia Assessment: Disappears with one eye closed     Perception     Praxis      Pertinent Vitals/Pain Pain Assessment Pain Assessment: No/denies pain     Hand Dominance Right   Extremity/Trunk Assessment Upper Extremity Assessment Upper Extremity Assessment: Overall WFL for tasks assessed   Lower Extremity Assessment Lower Extremity Assessment: Overall WFL for tasks assessed   Cervical / Trunk Assessment Cervical / Trunk Assessment: Normal   Communication Communication Communication: No difficulties   Cognition Arousal/Alertness: Awake/alert Behavior During  Therapy: WFL for tasks assessed/performed Overall Cognitive Status: Within Functional Limits for tasks assessed                                       General Comments       Exercises Other Exercises Other Exercises: OT ed with pt re: occulomotor ROM to prevent atrophy. No further education required.   Shoulder Instructions      Home Living Family/patient expects to be discharged to:: Private residence Living Arrangements: Children Available Help at Discharge: Family;Available PRN/intermittently Type of Home: House Home Access: Stairs to enter [employer] of Steps: 1 Entrance Stairs-Rails: None Home Layout: One level     Bathroom Shower/Tub: Tub/shower unit;Walk-in Psychologist, prison and probation services: Standard     Home Equipment: None      Lives With: Spouse    Prior Functioning/Environment Prior Level of Function : Independent/Modified Independent                        OT Problem List: Impaired vision/perception      OT Treatment/Interventions:      OT Goals(Current goals can be found in the care plan section) Acute Rehab OT Goals Patient Stated Goal: to go home OT Goal Formulation: All assessment and education complete, DC therapy  OT Frequency:      Co-evaluation              AM-PAC OT "6 Clicks" Daily Activity     Outcome Measure Help from another person eating meals?: None Help from another person taking care of personal grooming?: None Help from another person toileting, which includes using toliet, bedpan, or urinal?: None Help from another person bathing (including washing, rinsing, drying)?: None Help from another person to put on and taking off regular upper body clothing?: None Help from another person to put on and taking off regular lower body clothing?: None 6 Click Score: 24   End of Session Nurse Communication: Mobility status  Activity Tolerance: Patient tolerated treatment well Patient left: Other  (comment) (to restroom I'ly, taking IV pole)  OT Visit Diagnosis: Other (comment) (H53.2 Diplopia)                Time: 3888-2800 OT Time Calculation (min): 14 min Charges:  OT General Charges $OT Visit: 1 Visit OT Evaluation $OT Eval Low Complexity: [place], [place], OTR/L ascom [phone] 02/25/21, 4:17 PM

## 2021-02-25 NOTE — Progress Notes (Signed)
PT Cancellation Note  Patient Details Name: Jacob Velasquez MRN: [id number] DOB: [birth year]   Cancelled Treatment:    Reason Eval/Treat Not Completed: PT screened, no needs identified, will sign off. PT spoke w/ RN prior to entering room. RN reports Pt has been up and walking around the unit independently. Pt states he feels like he is at baseline and does not require PT, PT to sign off.    Jonnie Kind, SPT 02/25/2021, 1:18 PM

## 2021-02-25 NOTE — Progress Notes (Signed)
PROGRESS NOTE    Merric Yost  PZW:[id number] DOB: 06/27/1945 DOA: 02/24/2021 PCP: Baxter Hire, MD   Chief Complaint  Patient presents with   Eye Problem  Brief Narrative/Hospital Course: Bonnye Fava, 76 y.o. male with PMH of TIA, hypertension, hyperlipidemia, newly diagnosis Afib on Eliquis, tobacco abuse, who presents with left eye ptosis. Recent hospitalization 1/20-1/24 due to left eyelid droop and diplopia underwent extensive work-up with CT head, MRI brain without contrast, MRA head and neck, and MRI orbits with and without contrast. It was largely negative for acute stroke. 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   Dr Cheral Marker of neurology recommended patient return to hospital for further workup including LP. Pt denies any injury. He had sinusitis symptoms for the past 2-3 weeks with pain in the bilateral orbitofrontal region as well as in back of the nasal bridge that has improved during this admission with medication.    In ED:WBC 9.2, potassium 3.4, AKI with creatinine 1.44, BUN with GFR 51 (baseline creatinine 1.24 and GFR > 60 on 02/21/2021), negative COVID PCR, temperature normal, blood pressure 121/75, heart rate 69, RR 16, oxygen saturation 95% on room air.  Seen by neurology underwent LP and admitted for further management   subjective: Seen and examined this morning.  No new complaint.  Left eyelid ptosis unable to look inward with left eye  Assessment & Plan:  Diplopia: Neurology following underwent further work-up with LP recent MRI CT scan for stroke work-up was unremarkable.  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 S/P LP 1/25 going for repeat LP continue to hold anticoagulation discussed with neurology.  Follow-up"IgG index, oligoclonal bands to assess for inflammation, lyme titers, VDRL, cytology, fungal culture and bacterial culture in addition to protein, glucose and cell count  with differential. F/u  serum RPR, ESR and C-reactive protein drawn"  Essential hypertension: BP controlled on amlodipine Cozaar and HCTZ on hold due to AKI  History of tobacco abuse-cessation counseling  PAF: Rate controlled, Eliquis remains on hold for LP.  Resume once okay postprocedure  AKI: Cozaar and HCTZ held on admission given IV fluids.  Follow-up labs with improved renal function, cut down IV fluids.  Slowly resume home meds Recent Labs  Lab 02/21/21 1813 02/24/21 0930 02/25/21 0743  BUN 17 31* 25*  CREATININE 1.24 1.44* 1.13     Hypokalemia: Replaced  TIA continue with asa  DVT prophylaxis: SCDs Start: 02/24/21 0901Eliquis Code Status:   Code Status: Full Code Family Communication: plan of care discussed with patient at bedside. Status is: Admitted as observation Remains hospitalized for ongoing work-up with repeat LP and will need at least 2 midnight stay Disposition: Currently not medically stable for discharge. Anticipated Disposition: TBD  Total time spent in the care of this patient 50 MINUTES Objective: Vitals last 24 hrs: Vitals:   02/24/21 1536 02/24/21 1937 02/25/21 0527 02/25/21 0815  BP: (!) 142/93 131/80 (!) 144/95 (!) 140/92  Pulse: 68 82 63 69  Resp: $Remo'16 20 20 16  'RiNfS$ Temp: 98 F (36.7 C) 97.9 F (36.6 C) 98.4 F (36.9 C) 97.8 F (36.6 C)  TempSrc:  Oral Oral Oral  SpO2: 98% 98% 95% 96%  Weight:      Height:       Weight  change:   Intake/Output Summary (Last 24 hours) at 02/25/2021 0945 Last data filed at 02/25/2021 8250 Gross per 24 hour  Intake 2187.15 ml  Output 350 ml  Net 1837.15 ml   Net IO Since Admission: 1,837.15 mL [02/25/21 0945]   Physical  Examination: General exam: AA0X3, weak,older than stated age. HEENT:Oral mucosa moist, Ear/Nose WNL grossly,dentition normal. Respiratory system: B/l diminished BS, no use of accessory muscle, non tender. Cardiovascular system: S1 & S2 +,No JVD. Gastrointestinal system: Abdomen soft, NT,ND, BS+. Nervous System:Alert, awake, moving extremities.  Left eye with ptosis Extremities: edema none, distal peripheral pulses palpable.  Skin: No rashes, no icterus. MSK: Normal muscle bulk, tone, power.  Medications reviewed:  Scheduled Meds:  amLODipine  5 mg Oral Daily   apixaban  5 mg Oral BID   aspirin EC  81 mg Oral Daily   atorvastatin  40 mg Oral Daily   nicotine  21 mg Transdermal Daily   polyethylene glycol  17 g Oral Daily   Continuous Infusions:  0.9 % NaCl with KCl 20 mEq / L 30 mL/hr at 02/25/21 5397   Diet Order             Diet NPO time specified Except for: Ice Chips, Sips with Meds  Diet effective midnight                          Weight change:   Wt Readings from Last 3 Encounters:  02/24/21 98.1 kg  02/22/21 98.1 kg  07/30/20 99.8 kg     Consultants:see note  Procedures:see note Antimicrobials: Anti-infectives (From admission, onward)    None      Culture/Microbiology    Component Value Date/Time   SDES  02/24/2021 1116    CSF Performed at [facility], [address] [place], [place] [address]    [place]  02/24/2021 1116    NONE Performed at [facility], [address], [place], [place] [address]    CULT  02/24/2021 1116    NO GROWTH < 12 HOURS Performed at [facility] [address], [place], [place] [address]    REPTSTATUS PENDING 02/24/2021 1116    Other culture-see note  Unresulted Labs (From admission, onward)     Start     Ordered   02/26/21 3532  Basic metabolic panel  Daily,   R     Question:  Specimen collection method  Answer:  Lab=Lab collect   02/25/21 0729   02/24/21 1120  VDRL, CSF   RELEASE UPON ORDERING,   STAT        02/24/21 1120   02/24/21 1120  Fungus Culture With Stain  RELEASE UPON ORDERING,   STAT        02/24/21 1120   02/24/21 1120  Oligoclonal bands, CSF + serm  RELEASE UPON ORDERING,   STAT        02/24/21 1120   02/24/21 1120  Draw extra clot tube  RELEASE UPON ORDERING,   STAT        02/24/21 1120   02/24/21 1000  IgG CSF index  Once,   R        02/24/21 1000   02/24/21 1000  Lyme disease dna by pcr(borrelia burg)  Once,   R        02/24/21 1000   02/24/21 0853  Draw extra clot tube  (IgG CSF Index, CSF + Serum panel)  Once,   STAT  02/24/21 0852   02/24/21 0853  Lyme Disease Serology w/Reflex  Once,   STAT        02/24/21 7564           Data Reviewed: I have personally reviewed following labs and imaging studies CBC: Recent Labs  Lab 02/21/21 1813 02/24/21 0930  WBC 7.8 9.2  NEUTROABS 5.2  --   HGB 15.6 15.0  HCT 47.1 45.6  MCV 91.1 90.7  PLT 254 332   Basic Metabolic Panel: Recent Labs  Lab 02/21/21 1813 02/24/21 0930 02/25/21 0743  NA 136 136 136  K 3.6 3.4* 4.0  CL 104 102 106  CO2 $Re'24 25 25  'HhO$ GLUCOSE 139* 110* 98  BUN 17 31* 25*  CREATININE 1.24 1.44* 1.13  CALCIUM 9.0 9.3 8.3*  MG  --  2.0  --    GFR: Estimated Creatinine Clearance: 65.7 mL/min (by C-G formula based on SCr of 1.13 mg/dL). Liver Function Tests: Recent Labs  Lab 02/21/21 1813  AST 21  ALT 16  ALKPHOS 68  BILITOT 1.4*  PROT 7.3  ALBUMIN 3.8   No results for input(s): LIPASE, AMYLASE in the last 168 hours. No results for input(s): AMMONIA in the last 168 hours. Coagulation Profile: Recent Labs  Lab 02/21/21 1813  INR 1.0   Cardiac Enzymes: No results for input(s): CKTOTAL, CKMB, CKMBINDEX, TROPONINI in the last 168 hours. BNP (last 3 results) No results for input(s): PROBNP in the last 8760 hours. HbA1C: No results for input(s): HGBA1C in the last 72 hours. CBG: Recent Labs  Lab 02/21/21 2048  GLUCAP 113*   Lipid Profile: No  results for input(s): CHOL, HDL, LDLCALC, TRIG, CHOLHDL, LDLDIRECT in the last 72 hours. Thyroid Function Tests: No results for input(s): TSH, T4TOTAL, FREET4, T3FREE, THYROIDAB in the last 72 hours. Anemia Panel: No results for input(s): VITAMINB12, FOLATE, FERRITIN, TIBC, IRON, RETICCTPCT in the last 72 hours. Sepsis Labs: No results for input(s): PROCALCITON, LATICACIDVEN in the last 168 hours.  Recent Results (from the past 240 hour(s))  Resp Panel by RT-PCR (Flu A&B, Covid) Nasopharyngeal Swab     Status: None   Collection Time: 02/24/21  9:30 AM   Specimen: Nasopharyngeal Swab; Nasopharyngeal(NP) swabs in vial transport medium  Result Value Ref Range Status   SARS Coronavirus 2 by RT PCR NEGATIVE NEGATIVE Final    Comment: (NOTE) SARS-CoV-2 target nucleic acids are NOT DETECTED.  The SARS-CoV-2 RNA is generally detectable in upper respiratory specimens during the acute phase of infection. The lowest concentration of SARS-CoV-2 viral copies this assay can detect is 138 copies/mL. A negative result does not preclude SARS-Cov-2 infection and should not be used as the sole basis for treatment or other patient management decisions. A negative result may occur with  improper specimen collection/handling, submission of specimen other than nasopharyngeal swab, presence of viral mutation(s) within the areas targeted by this assay, and inadequate number of viral copies(<138 copies/mL). A negative result must be combined with clinical observations, patient history, and epidemiological information. The expected result is Negative.  Fact Sheet for Patients:  EntrepreneurPulse.com.au  Fact Sheet for Healthcare Providers:  IncredibleEmployment.be  This test is no t yet approved or cleared by the [place] FDA and  has been authorized for detection and/or diagnosis of SARS-CoV-2 by FDA under an Emergency Use Authorization (EUA). This EUA will remain   in effect (meaning this test can be used) for the duration of the COVID-19 declaration under Section 564(b)(1) of the Act, 21  U.S.C.section 360bbb-3(b)(1), unless the authorization is terminated  or revoked sooner.       Influenza A by PCR NEGATIVE NEGATIVE Final   Influenza B by PCR NEGATIVE NEGATIVE Final    Comment: (NOTE) The Xpert Xpress SARS-CoV-2/FLU/RSV plus assay is intended as an aid in the diagnosis of influenza from Nasopharyngeal swab specimens and should not be used as a sole basis for treatment. Nasal washings and aspirates are unacceptable for Xpert Xpress SARS-CoV-2/FLU/RSV testing.  Fact Sheet for Patients: EntrepreneurPulse.com.au  Fact Sheet for Healthcare Providers: IncredibleEmployment.be  This test is not yet approved or cleared by the [place] FDA and has been authorized for detection and/or diagnosis of SARS-CoV-2 by FDA under an Emergency Use Authorization (EUA). This EUA will remain in effect (meaning this test can be used) for the duration of the COVID-19 declaration under Section 564(b)(1) of the Act, 21 U.S.C. section 360bbb-3(b)(1), unless the authorization is terminated or revoked.  Performed at [facility], [address] [place], [place] [address]   CSF culture w Gram Stain     Status: None (Preliminary result)   Collection Time: 02/24/21 11:16 AM   Specimen: PATH Cytology CSF; Cerebrospinal Fluid  Result Value Ref Range Status   Specimen Description   Final    CSF Performed at [facility], [address], [place], [place] [address]    Special Requests   Final    NONE Performed at [facility], [place], [place] [address]    Gram Stain   Final    NO ORGANISMS SEEN WBC SEEN RED BLOOD CELLS GRAM STAIN REVIEWED-AGREE WITH RESULT    Culture   Final    NO GROWTH < 12 HOURS Performed at [facility], [address], [place], [place]  [address]    Report Status PENDING  Incomplete     Radiology Studies: ECHOCARDIOGRAM COMPLETE BUBBLE STUDY  Result Date: 02/23/2021    ECHOCARDIOGRAM REPORT   Patient Name:   MARSALIS BEAULIEU Date of Exam: 02/23/2021 Medical Rec #:  [id number]          Height:       74.0 in Accession #:    [id number]         Weight:       216.3 lb Date of Birth:  [birth year]          BSA:          2.247 m Patient Age:    66 years           BP:           152/82 mmHg Patient Gender: M                  HR:           78 bpm. Exam Location:  [facility] Procedure: 2D Echo, Color Doppler, Cardiac Doppler and Saline Contrast Bubble            Study Indications:     I63.9 Stroke  History:         Patient has prior history of Echocardiogram examinations.                  Stroke; Risk Factors:Hypertension.  Sonographer:     Charmayne Sheer Referring Phys:  [id number] Gretta Cool PATEL Diagnosing Phys: Yolonda Kida MD  Sonographer Comments: Suboptimal subcostal window. IMPRESSIONS  1. Left ventricular ejection fraction, by estimation, is 65 to 70%. The left ventricle has  normal function. The left ventricle has no regional wall motion abnormalities. Left ventricular diastolic parameters are consistent with Grade III diastolic dysfunction (restrictive).  2. Right ventricular systolic function is normal. The right ventricular size is normal.  3. The mitral valve is normal in structure. Trivial mitral valve regurgitation.  4. The aortic valve is normal in structure. Aortic valve regurgitation is not visualized. FINDINGS  Left Ventricle: Left ventricular ejection fraction, by estimation, is 65 to 70%. The left ventricle has normal function. The left ventricle has no regional wall motion abnormalities. The left ventricular internal cavity size was normal in size. There is  no left ventricular hypertrophy. Left ventricular diastolic parameters are consistent with Grade III diastolic dysfunction (restrictive). Right Ventricle: The right ventricular size is normal.  No increase in right ventricular wall thickness. Right ventricular systolic function is normal. Left Atrium: Left atrial size was normal in size. Right Atrium: Right atrial size was normal in size. Pericardium: There is no evidence of pericardial effusion. Mitral Valve: The mitral valve is normal in structure. Trivial mitral valve regurgitation. MV peak gradient, 3.1 mmHg. The mean mitral valve gradient is 2.0 mmHg. Tricuspid Valve: The tricuspid valve is normal in structure. Tricuspid valve regurgitation is mild. Aortic Valve: The aortic valve is normal in structure. Aortic valve regurgitation is not visualized. Aortic valve mean gradient measures 4.0 mmHg. Aortic valve peak gradient measures 7.2 mmHg. Aortic valve area, by VTI measures 4.02 cm. Pulmonic Valve: The pulmonic valve was normal in structure. Pulmonic valve regurgitation is not visualized. Aorta: The ascending aorta was not well visualized. IAS/Shunts: No atrial level shunt detected by color flow Doppler. Agitated saline contrast was given intravenously to evaluate for intracardiac shunting.  LEFT VENTRICLE PLAX 2D LVIDd:         4.78 cm   Diastology LVIDs:         3.06 cm   LV e' medial:    9.90 cm/s LV PW:         1.11 cm   LV E/e' medial:  8.9 LV IVS:        0.91 cm   LV e' lateral:   10.20 cm/s LVOT diam:     2.50 cm   LV E/e' lateral: 8.7 LV SV:         80 LV SV Index:   36 LVOT Area:     4.91 cm  RIGHT VENTRICLE RV Basal diam:  3.74 cm LEFT ATRIUM             Index        RIGHT ATRIUM           Index LA diam:        3.60 cm 1.60 cm/m   RA Area:     26.40 cm LA Vol (A2C):   51.4 ml 22.88 ml/m  RA Volume:   83.60 ml  37.21 ml/m LA Vol (A4C):   50.6 ml 22.52 ml/m LA Biplane Vol: 53.0 ml 23.59 ml/m  AORTIC VALVE AV Area (Vmax):    3.25 cm AV Area (Vmean):   3.63 cm AV Area (VTI):     4.02 cm AV Vmax:           134.00 cm/s AV Vmean:          85.900 cm/s AV VTI:            0.199 m AV Peak Grad:      7.2 mmHg AV Mean Grad:      4.0  mmHg LVOT  Vmax:         88.80 cm/s LVOT Vmean:        63.500 cm/s LVOT VTI:          0.163 m LVOT/AV VTI ratio: 0.82  AORTA Ao Root diam: 3.20 cm MITRAL VALVE MV Area (PHT): 3.54 cm    SHUNTS MV Area VTI:   6.56 cm    Systemic VTI:  0.16 m MV Peak grad:  3.1 mmHg    Systemic Diam: 2.50 cm MV Mean grad:  2.0 mmHg MV Vmax:       0.88 m/s MV Vmean:      58.3 cm/s MV Decel Time: 214 msec MV E velocity: 88.25 cm/s MV A velocity: 34.30 cm/s MV E/A ratio:  2.57 Dwayne D Callwood MD Electronically signed by Yolonda Kida MD Signature Date/Time: 02/23/2021/2:05:37 PM    Final    DG FL GUIDED LUMBAR PUNCTURE  Result Date: 02/24/2021 CLINICAL DATA:  Patient with left eye ptosis and diplopia concern for meningitis request received for diagnostic lumbar puncture. EXAM: DIAGNOSTIC LUMBAR PUNCTURE UNDER FLUOROSCOPIC GUIDANCE COMPARISON:  CT head imaging performed 02/21/2021 and MRI brain imaging performed 02/22/2021 report reviewed prior to the procedure. FLUOROSCOPY TIME:  Fluoroscopy Time:  0.3 minute Radiation Exposure Index (if provided by the fluoroscopic device): 9.20 mGy Number of Acquired Spot Images: 3 PROCEDURE: Informed consent was obtained from the patient/patient's legal guardian prior to the procedure, including potential complications of bleeding, infection, paresthesias, nerve damage, CSF leak requiring additional procedures, post procedure requirement to lay flat for several hours after the procedure, headache, allergy, and pain. With the patient prone, the lower back was prepped with Betadine. 1% Lidocaine was used for local anesthesia. Lumbar puncture was performed at the L5-S1 level using a 20 gauge needle with return of slightly blood-tinged CSF secondary to patient moving. Six ml of CSF were obtained for laboratory studies, unable to collect additional fluid due to low CSF pressure. The inner stylet was placed back into the needle and the needle was removed in its entirety. The patient tolerated the procedure  well and there were no apparent complications. A sterile bandage was applied. IMPRESSION: Technically successful fluoroscopic guided lumbar puncture. This exam was performed by Tsosie Billing PA-C, and was supervised and interpreted by Dr. Nyoka Cowden. Electronically Signed   By: Marijo Conception M.D.   On: 02/24/2021 12:12     LOS: 0 days   Antonieta Pert, MD [facility]  02/25/2021, 9:45 AM

## 2021-02-25 NOTE — Care Management Obs Status (Signed)
[place] NOTIFICATION   Patient Details  Name: Jacob Velasquez MRN: [id number] Date of Birth: [birth year]   Medicare Observation Status Notification Given:  Yes    Candie Chroman, LCSW 02/25/2021, 4:46 PM

## 2021-02-25 NOTE — Discharge Summary (Signed)
Physician Discharge Summary  Jacob Velasquez WUJ:[id number] DOB: [birth year] DOA: 02/24/2021  PCP: Baxter Hire, MD  Admit date: 02/24/2021 Discharge date: 02/25/2021  Admitted From: home Disposition:  home  Recommendations for Outpatient Follow-up:  Follow up with PCP in 1-2 weeks Please obtain BMP/CBC in one week Please follow up on the following pending results:  Home Health:no  Equipment/Devices: none  Discharge Condition: Stable Code Status:   Code Status: Full Code Diet recommendation:  Diet Order             Diet Heart Room service appropriate? Yes; Fluid consistency: Thin  Diet effective now                    Brief/Interim Summary:  76 y.o. male with PMH of TIA, hypertension, hyperlipidemia, newly diagnosis Afib on Eliquis, tobacco abuse, who presents with left eye ptosis. Recent hospitalization 1/20-1/24 due to left eyelid droop and diplopia underwent extensive work-up with CT head, MRI brain without contrast, MRA head and neck, and MRI orbits with and without contrast. It was largely negative for acute stroke. 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    Dr Cheral Marker of neurology recommended patient return to hospital for further workup including LP. Pt denies any injury. He had sinusitis symptoms for the past 2-3 weeks with pain in the bilateral orbitofrontal region as well as in back of the nasal bridge that has improved during this admission with medication.    In ED:WBC 9.2, potassium 3.4, AKI with creatinine 1.44, BUN with GFR 51 (baseline creatinine 1.24 and GFR > 60 on 02/21/2021), negative COVID PCR, temperature normal, blood pressure 121/75, heart rate 69, RR 16, oxygen saturation 95% on room air.  Seen by neurology underwent LP and admitted for further management   Patient underwent LP second tap WBC 21 but accounting for RBC it would be 7 slightly greater than the normal range, it appears he does have elevated protein discussed  neurology, since no active infectious etiology suspected.  Could be inflammatory-advise a course of prednisone this with the patient and he is in agreement.  See neuro recommendation as below.  He will follow-up with neurology as outpatient  " Elevated protein count is concerning for an inflammatory process. Microvascular ischemia (risk factors include HTN and atherosclerosis) that is not inflammatory in nature is still relatively high on the DDx despite the left CN3 enhancement seen on MRI. However, benefit/risk profile overall is felt to favor empiric steroid treatment as an inflammatory etiology is felt to be highest on the DDx.  - May benefit from oral steroid followed by tapering for total of 2 weeks based on case reports in the literature. Recommend prednisone 60 mg po qd x 9 days followed by tapering by 10 mg per day (50, 40, 30, 20, 10, then stop).  - Will need follow up of remaining CSF labs, most of which will take > 24 hours to result.  - Will need follow up appointment with Neuroophthalmologist Dr. Chinita Pester of [facility]. Long term follow up after that with Dr. Humphrey Rolls of Neurology at the [facility]. "  Discharge Diagnoses:  CN III palsy left pupil sparing, with ptosis, diplopia, ESR normal CRP normal RPR nonreactive.  Underwent extensive work-up as above with LP and being discharged on oral steroids with outpatient follow-up with neurology  Essential hypertension: BP controlled - resume home Cozaar and HCTZ on d/c. History of tobacco abuse-cessation counseling PAF: Rate controlled, resume Eliquis on d/c.  TTS:[id number] off ivf.  Can resume home Cozaar and HCTZ  Hypokalemia: Replaced TIA continue home Eliquis.  Consults: Neurology  Subjective: Alert awake oriented, wanting to go home today after completing second LP Discharge Exam: Vitals:   02/25/21 1238 02/25/21 1537  BP: (!) 136/92 (!) 146/93  Pulse: 81 62  Resp: 18 17  Temp: 98.2 F (36.8 C) (!) 97.5 F  (36.4 C)  SpO2: 98% 97%   General: Pt is alert, awake, not in acute distress Cardiovascular: RRR, S1/S2 +, no rubs, no gallops Respiratory: CTA bilaterally, no wheezing, no rhonchi Abdominal: Soft, NT, ND, bowel sounds + Extremities: no edema, no cyanosis  Discharge Instructions  Discharge Instructions     Discharge instructions   Complete by: As directed    Please call call MD or return to ER for similar or worsening recurring problem that brought you to hospital or if any fever,nausea/vomiting,abdominal pain, uncontrolled pain, chest pain,  shortness of breath or any other alarming symptoms.  Follow up with neurology as instructed.  Please follow-up your doctor as instructed in a week time and call the office for appointment.  Please avoid alcohol, smoking, or any other illicit substance and maintain healthy habits including taking your regular medications as prescribed.  You were cared for by a hospitalist during your hospital stay. If you have any questions about your discharge medications or the care you received while you were in the hospital after you are discharged, you can call the unit and ask to speak with the hospitalist on call if the hospitalist that took care of you is not available.  Once you are discharged, your primary care physician will handle any further medical issues. Please note that NO REFILLS for any discharge medications will be authorized once you are discharged, as it is imperative that you return to your primary care physician (or establish a relationship with a primary care physician if you do not have one) for your aftercare needs so that they can reassess your need for medications and monitor your lab values   Increase activity slowly   Complete by: As directed       Allergies as of 02/25/2021   No Known Allergies      Medication List     STOP taking these medications    clopidogrel 75 MG tablet Commonly known as: PLAVIX   loratadine 10 MG  tablet Commonly known as: CLARITIN   predniSONE 10 MG (21) Tbpk tablet Commonly known as: STERAPRED UNI-PAK 21 TAB Replaced by: predniSONE 10 MG tablet   pseudoephedrine 120 MG 12 hr tablet Commonly known as: SUDAFED       TAKE these medications    acetaminophen 325 MG tablet Commonly known as: TYLENOL Take 2 tablets (650 mg total) by mouth every 4 (four) hours as needed for mild pain (or temp > 37.5 C (99.5 F)).   apixaban 5 MG Tabs tablet Commonly known as: ELIQUIS Take 1 tablet (5 mg total) by mouth 2 (two) times daily.   atorvastatin 40 MG tablet Commonly known as: Lipitor Take 1 tablet (40 mg total) by mouth daily.   dextromethorphan-guaiFENesin 30-600 MG 12hr tablet Commonly known as: MUCINEX DM Take 1 tablet by mouth 2 (two) times daily.   fluticasone 50 MCG/ACT nasal spray Commonly known as: FLONASE Place 2 sprays into both nostrils daily.   hydrochlorothiazide 12.5 MG tablet Commonly known as: HYDRODIURIL Take 12.5 mg by mouth daily.   losartan 50 MG tablet Commonly known as: COZAAR Take 50  mg by mouth daily.   predniSONE 10 MG tablet Commonly known as: DELTASONE Take 60 mg ( 6 tabs) po daily x 8 days then 5 tabs x1 day, 4 tabs x 1 day,3 tabs x1 day,2 tabs x1 day then 1 tab x1day then stop Replaces: predniSONE 10 MG (21) Tbpk tablet        Follow-up Information     Jennelle Human, MD. Call in 3 day(s).   Specialty: Ophthalmology Contact information: [address] [place] [address] [phone]         Vladimir Crofts, MD Follow up in 1 week(s).   Specialty: Neurology Contact information: [place] [facility] [place] [address] [phone]         Baxter Hire, MD Follow up in 1 week(s).   Specialty: Internal Medicine Contact information: [place] [address] [phone]                No Known Allergies  The results of significant diagnostics from this  hospitalization (including imaging, microbiology, ancillary and laboratory) are listed below for reference.    Microbiology: Recent Results (from the past 240 hour(s))  Resp Panel by RT-PCR (Flu A&B, Covid) Nasopharyngeal Swab     Status: None   Collection Time: 02/24/21  9:30 AM   Specimen: Nasopharyngeal Swab; Nasopharyngeal(NP) swabs in vial transport medium  Result Value Ref Range Status   SARS Coronavirus 2 by RT PCR NEGATIVE NEGATIVE Final    Comment: (NOTE) SARS-CoV-2 target nucleic acids are NOT DETECTED.  The SARS-CoV-2 RNA is generally detectable in upper respiratory specimens during the acute phase of infection. The lowest concentration of SARS-CoV-2 viral copies this assay can detect is 138 copies/mL. A negative result does not preclude SARS-Cov-2 infection and should not be used as the sole basis for treatment or other patient management decisions. A negative result may occur with  improper specimen collection/handling, submission of specimen other than nasopharyngeal swab, presence of viral mutation(s) within the areas targeted by this assay, and inadequate number of viral copies(<138 copies/mL). A negative result must be combined with clinical observations, patient history, and epidemiological information. The expected result is Negative.  Fact Sheet for Patients:  EntrepreneurPulse.com.au  Fact Sheet for Healthcare Providers:  IncredibleEmployment.be  This test is no t yet approved or cleared by the [place] FDA and  has been authorized for detection and/or diagnosis of SARS-CoV-2 by FDA under an Emergency Use Authorization (EUA). This EUA will remain  in effect (meaning this test can be used) for the duration of the COVID-19 declaration under Section 564(b)(1) of the Act, 21 U.S.C.section 360bbb-3(b)(1), unless the authorization is terminated  or revoked sooner.       Influenza A by PCR NEGATIVE NEGATIVE Final    Influenza B by PCR NEGATIVE NEGATIVE Final    Comment: (NOTE) The Xpert Xpress SARS-CoV-2/FLU/RSV plus assay is intended as an aid in the diagnosis of influenza from Nasopharyngeal swab specimens and should not be used as a sole basis for treatment. Nasal washings and aspirates are unacceptable for Xpert Xpress SARS-CoV-2/FLU/RSV testing.  Fact Sheet for Patients: EntrepreneurPulse.com.au  Fact Sheet for Healthcare Providers: IncredibleEmployment.be  This test is not yet approved or cleared by the [place] FDA and has been authorized for detection and/or diagnosis of SARS-CoV-2 by FDA under an Emergency Use Authorization (EUA). This EUA will remain in effect (meaning this test can be used) for the duration of the COVID-19 declaration under Section  564(b)(1) of the Act, 21 U.S.C. section 360bbb-3(b)(1), unless the authorization is terminated or revoked.  Performed at [facility], [address] [place], [place] [address]   CSF culture w Gram Stain     Status: None (Preliminary result)   Collection Time: 02/24/21 11:16 AM   Specimen: PATH Cytology CSF; Cerebrospinal Fluid  Result Value Ref Range Status   Specimen Description   Final    CSF Performed at [facility], [address] [place], [place] [address]    Special Requests   Final    NONE Performed at [facility], [place], [place] [address]    Gram Stain   Final    NO ORGANISMS SEEN WBC SEEN RED BLOOD CELLS GRAM STAIN REVIEWED-AGREE WITH RESULT    Culture   Final    NO GROWTH < 12 HOURS Performed at [facility], [place] [address], [place], [place] [address]    Report Status PENDING  Incomplete  CSF culture w Gram Stain     Status: None (Preliminary result)   Collection Time: 02/25/21 12:05 PM   Specimen: PATH Cytology CSF; Cerebrospinal Fluid  Result Value Ref Range Status   Specimen Description CSF  Final   Special  Requests NONE  Final   Gram Stain   Final    NO ORGANISMS SEEN WBC SEEN Performed at [facility], [address] [place], [place] [address]    Culture PENDING  Incomplete   Report Status PENDING  Incomplete    Procedures/Studies: CT HEAD WO CONTRAST  Result Date: 02/21/2021 CLINICAL DATA:  Seizure, new-onset, no history of trauma. Double vision. EXAM: CT HEAD WITHOUT CONTRAST TECHNIQUE: Contiguous axial images were obtained from the base of the skull through the vertex without intravenous contrast. RADIATION DOSE REDUCTION: This exam was performed according to the departmental dose-optimization program which includes automated exposure control, adjustment of the mA and/or kV according to patient size and/or use of iterative reconstruction technique. COMPARISON:  07/30/2020 FINDINGS: Brain: There is atrophy and chronic small vessel disease changes. No acute intracranial abnormality. Specifically, no hemorrhage, hydrocephalus, mass lesion, acute infarction, or significant intracranial injury. Vascular: No hyperdense vessel or unexpected calcification. Skull: No acute calvarial abnormality. Sinuses/Orbits: No acute findings Other: None IMPRESSION: Atrophy, chronic microvascular disease. No acute intracranial abnormality. Electronically Signed   By: Rolm Baptise M.D.   On: 02/21/2021 18:39   MR ANGIO HEAD WO CONTRAST  Result Date: 02/22/2021 CLINICAL DATA:  Initial evaluation for acute neuro deficit, stroke suspected, double vision. EXAM: MRI HEAD WITHOUT CONTRAST MRA HEAD WITHOUT CONTRAST MRA NECK WITHOUT AND WITH CONTRAST TECHNIQUE: Multiplanar, multi-echo pulse sequences of the brain and surrounding structures were acquired without intravenous contrast. Angiographic images of the Circle of Willis were acquired using MRA technique without intravenous contrast. Angiographic images of the neck were acquired using MRA technique without and with intravenous contrast. Carotid stenosis  measurements (when applicable) are obtained utilizing NASCET criteria, using the distal internal carotid diameter as the denominator. CONTRAST:  4mL GADAVIST GADOBUTROL 1 MMOL/ML IV SOLN COMPARISON:  Prior head CT from 02/21/2021. FINDINGS: MRI HEAD FINDINGS Brain: Diffuse prominence of the CSF containing spaces compatible generalized cerebral atrophy. Patchy and confluent T2/FLAIR hyperintensity involving the periventricular and deep white matter both cerebral hemispheres most consistent with chronic small vessel ischemic disease, mild-to-moderate in nature. Few scattered superimposed remote lacunar infarcts present about the bilateral basal ganglia/corona radiata. Small remote lacunar infarct noted at the right dorsal pons (series  10, image 8). Few probable tiny remote cerebellar infarcts noted as well. No abnormal foci of restricted diffusion to suggest acute or subacute ischemia. Gray-white matter differentiation maintained. No encephalomalacia to suggest chronic cortical infarction. No acute or chronic intracranial blood products. No mass lesion, midline shift or mass effect no hydrocephalus or extra-axial fluid collection. Pituitary gland suprasellar region normal. Midline structures intact. Vascular: Major intracranial vascular flow voids are maintained. 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 Bone marrow signal intensity within normal limits. No scalp soft tissue abnormality. Sinuses/Orbits: Globes and orbital soft tissues demonstrate no acute finding. Paranasal sinuses are largely clear. No mastoid effusion. Inner ear structures grossly normal. Other: None. MRA HEAD FINDINGS Anterior circulation: Visualized distal cervical segments of the internal carotid arteries are patent with antegrade flow. Petrous segments patent bilaterally. Mild atheromatous irregularity seen throughout the carotid siphons without hemodynamically significant stenosis or other  abnormality. A1 segments patent bilaterally. Normal anterior communicating artery complex. Anterior cerebral arteries patent without significant stenosis. No M1 stenosis or occlusion. Normal MCA bifurcations. Distal MCA branches well perfused and symmetric. Mild diffuse small vessel atheromatous irregularity. Posterior circulation: Both vertebral arteries widely patent to the vertebrobasilar junction without stenosis. Left PICA origin patent. Right PICA not seen. Basilar patent to its distal aspect without stenosis. Superior cerebellar arteries patent bilaterally. Both PCA supplied via the basilar as well as small bowel posterior to the arteries. Short-segment moderate stenosis at the right P1/P2 junction (series 1052, image 9). Smooth moderate stenosis noted involving the distal left P2 segment (series 1058, image 4). PCAs otherwise patent to their distal aspects without stenosis. Distal small vessel atheromatous irregularity noted. Anatomic variants: None significant.  No aneurysm. MRA NECK FINDINGS Aortic arch: Visualized aortic arch normal caliber with normal branch pattern. No visible stenosis seen about the origin of the great vessels. Right carotid system: Right CCA patent from its origin to the bifurcation without stenosis. Atheromatous irregularity about the right carotid bulb/proximal right ICA without hemodynamically significant stenosis. Right ICA patent distally without stenosis or evidence for dissection. Left carotid system: Left CCA patent from its origin to the bifurcation without stenosis. Moderate atheromatous irregularity and plaque present about the left carotid bulb/proximal left ICA with associated stenosis of up to approximately 60% by NASCET criteria (series 1095, image 4). Left ICA otherwise patent distally without stenosis or evidence for dissection. Vertebral arteries: Both vertebral arteries arise from the subclavian arteries. No proximal subclavian artery stenosis. Vertebral arteries  are largely codominant widely patent without stenosis or evidence for dissection. Other: None IMPRESSION: MRI HEAD: 1. No acute intracranial abnormality. 2. Generalized cerebral atrophy with mild-to-moderate chronic small vessel ischemic disease with a few scattered remote lacunar infarcts involving the bilateral basal ganglia/corona radiata as well as the right dorsal pons. MRA HEAD: 1. Negative intracranial MRA for large vessel occlusion. 2. Moderate short-segment stenoses involving the right P1/P2 junction and distal left P2 segment. 3. Mild diffuse small vessel atheromatous irregularity throughout the intracranial circulation. MRA NECK: 1. 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 2. No hemodynamically significant or correctable stenosis about the right carotid artery system. 3. Wide patency of both vertebral arteries within the neck. 4. No evidence for dissection or other acute vascular abnormality. Electronically Signed   By: Jeannine Boga M.D.   On: 02/22/2021 01:27   MR ANGIO NECK W WO CONTRAST  Result Date: 02/22/2021 CLINICAL DATA:  Initial evaluation for acute neuro deficit, stroke  suspected, double vision. EXAM: MRI HEAD WITHOUT CONTRAST MRA HEAD WITHOUT CONTRAST MRA NECK WITHOUT AND WITH CONTRAST TECHNIQUE: Multiplanar, multi-echo pulse sequences of the brain and surrounding structures were acquired without intravenous contrast. Angiographic images of the Circle of Willis were acquired using MRA technique without intravenous contrast. Angiographic images of the neck were acquired using MRA technique without and with intravenous contrast. Carotid stenosis measurements (when applicable) are obtained utilizing NASCET criteria, using the distal internal carotid diameter as the denominator. CONTRAST:  28mL GADAVIST GADOBUTROL 1 MMOL/ML IV SOLN COMPARISON:  Prior head CT from 02/21/2021. FINDINGS: MRI HEAD FINDINGS Brain: Diffuse prominence of the CSF  containing spaces compatible generalized cerebral atrophy. Patchy and confluent T2/FLAIR hyperintensity involving the periventricular and deep white matter both cerebral hemispheres most consistent with chronic small vessel ischemic disease, mild-to-moderate in nature. Few scattered superimposed remote lacunar infarcts present about the bilateral basal ganglia/corona radiata. Small remote lacunar infarct noted at the right dorsal pons (series 10, image 8). Few probable tiny remote cerebellar infarcts noted as well. No abnormal foci of restricted diffusion to suggest acute or subacute ischemia. Gray-white matter differentiation maintained. No encephalomalacia to suggest chronic cortical infarction. No acute or chronic intracranial blood products. No mass lesion, midline shift or mass effect no hydrocephalus or extra-axial fluid collection. Pituitary gland suprasellar region normal. Midline structures intact. Vascular: Major intracranial vascular flow voids are maintained. 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 Bone marrow signal intensity within normal limits. No scalp soft tissue abnormality. Sinuses/Orbits: Globes and orbital soft tissues demonstrate no acute finding. Paranasal sinuses are largely clear. No mastoid effusion. Inner ear structures grossly normal. Other: None. MRA HEAD FINDINGS Anterior circulation: Visualized distal cervical segments of the internal carotid arteries are patent with antegrade flow. Petrous segments patent bilaterally. Mild atheromatous irregularity seen throughout the carotid siphons without hemodynamically significant stenosis or other abnormality. A1 segments patent bilaterally. Normal anterior communicating artery complex. Anterior cerebral arteries patent without significant stenosis. No M1 stenosis or occlusion. Normal MCA bifurcations. Distal MCA branches well perfused and symmetric. Mild diffuse small vessel atheromatous  irregularity. Posterior circulation: Both vertebral arteries widely patent to the vertebrobasilar junction without stenosis. Left PICA origin patent. Right PICA not seen. Basilar patent to its distal aspect without stenosis. Superior cerebellar arteries patent bilaterally. Both PCA supplied via the basilar as well as small bowel posterior to the arteries. Short-segment moderate stenosis at the right P1/P2 junction (series 1052, image 9). Smooth moderate stenosis noted involving the distal left P2 segment (series 1058, image 4). PCAs otherwise patent to their distal aspects without stenosis. Distal small vessel atheromatous irregularity noted. Anatomic variants: None significant.  No aneurysm. MRA NECK FINDINGS Aortic arch: Visualized aortic arch normal caliber with normal branch pattern. No visible stenosis seen about the origin of the great vessels. Right carotid system: Right CCA patent from its origin to the bifurcation without stenosis. Atheromatous irregularity about the right carotid bulb/proximal right ICA without hemodynamically significant stenosis. Right ICA patent distally without stenosis or evidence for dissection. Left carotid system: Left CCA patent from its origin to the bifurcation without stenosis. Moderate atheromatous irregularity and plaque present about the left carotid bulb/proximal left ICA with associated stenosis of up to approximately 60% by NASCET criteria (series 1095, image 4). Left ICA otherwise patent distally without stenosis or evidence for dissection. Vertebral arteries: Both vertebral arteries arise from the subclavian arteries. No proximal subclavian artery stenosis. Vertebral arteries are largely codominant widely patent without stenosis  or evidence for dissection. Other: None IMPRESSION: MRI HEAD: 1. No acute intracranial abnormality. 2. Generalized cerebral atrophy with mild-to-moderate chronic small vessel ischemic disease with a few scattered remote lacunar infarcts  involving the bilateral basal ganglia/corona radiata as well as the right dorsal pons. MRA HEAD: 1. Negative intracranial MRA for large vessel occlusion. 2. Moderate short-segment stenoses involving the right P1/P2 junction and distal left P2 segment. 3. Mild diffuse small vessel atheromatous irregularity throughout the intracranial circulation. MRA NECK: 1. 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 2. No hemodynamically significant or correctable stenosis about the right carotid artery system. 3. Wide patency of both vertebral arteries within the neck. 4. No evidence for dissection or other acute vascular abnormality. Electronically Signed   By: Jeannine Boga M.D.   On: 02/22/2021 01:27   MR BRAIN WO CONTRAST  Result Date: 02/22/2021 CLINICAL DATA:  Initial evaluation for acute neuro deficit, stroke suspected, double vision. EXAM: MRI HEAD WITHOUT CONTRAST MRA HEAD WITHOUT CONTRAST MRA NECK WITHOUT AND WITH CONTRAST TECHNIQUE: Multiplanar, multi-echo pulse sequences of the brain and surrounding structures were acquired without intravenous contrast. Angiographic images of the Circle of Willis were acquired using MRA technique without intravenous contrast. Angiographic images of the neck were acquired using MRA technique without and with intravenous contrast. Carotid stenosis measurements (when applicable) are obtained utilizing NASCET criteria, using the distal internal carotid diameter as the denominator. CONTRAST:  73mL GADAVIST GADOBUTROL 1 MMOL/ML IV SOLN COMPARISON:  Prior head CT from 02/21/2021. FINDINGS: MRI HEAD FINDINGS Brain: Diffuse prominence of the CSF containing spaces compatible generalized cerebral atrophy. Patchy and confluent T2/FLAIR hyperintensity involving the periventricular and deep white matter both cerebral hemispheres most consistent with chronic small vessel ischemic disease, mild-to-moderate in nature. Few scattered superimposed  remote lacunar infarcts present about the bilateral basal ganglia/corona radiata. Small remote lacunar infarct noted at the right dorsal pons (series 10, image 8). Few probable tiny remote cerebellar infarcts noted as well. No abnormal foci of restricted diffusion to suggest acute or subacute ischemia. Gray-white matter differentiation maintained. No encephalomalacia to suggest chronic cortical infarction. No acute or chronic intracranial blood products. No mass lesion, midline shift or mass effect no hydrocephalus or extra-axial fluid collection. Pituitary gland suprasellar region normal. Midline structures intact. Vascular: Major intracranial vascular flow voids are maintained. 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 Bone marrow signal intensity within normal limits. No scalp soft tissue abnormality. Sinuses/Orbits: Globes and orbital soft tissues demonstrate no acute finding. Paranasal sinuses are largely clear. No mastoid effusion. Inner ear structures grossly normal. Other: None. MRA HEAD FINDINGS Anterior circulation: Visualized distal cervical segments of the internal carotid arteries are patent with antegrade flow. Petrous segments patent bilaterally. Mild atheromatous irregularity seen throughout the carotid siphons without hemodynamically significant stenosis or other abnormality. A1 segments patent bilaterally. Normal anterior communicating artery complex. Anterior cerebral arteries patent without significant stenosis. No M1 stenosis or occlusion. Normal MCA bifurcations. Distal MCA branches well perfused and symmetric. Mild diffuse small vessel atheromatous irregularity. Posterior circulation: Both vertebral arteries widely patent to the vertebrobasilar junction without stenosis. Left PICA origin patent. Right PICA not seen. Basilar patent to its distal aspect without stenosis. Superior cerebellar arteries patent bilaterally. Both PCA supplied via  the basilar as well as small bowel posterior to the arteries. Short-segment moderate stenosis at the right P1/P2 junction (series 1052, image 9). Smooth moderate stenosis noted involving the distal left P2 segment (series  1058, image 4). PCAs otherwise patent to their distal aspects without stenosis. Distal small vessel atheromatous irregularity noted. Anatomic variants: None significant.  No aneurysm. MRA NECK FINDINGS Aortic arch: Visualized aortic arch normal caliber with normal branch pattern. No visible stenosis seen about the origin of the great vessels. Right carotid system: Right CCA patent from its origin to the bifurcation without stenosis. Atheromatous irregularity about the right carotid bulb/proximal right ICA without hemodynamically significant stenosis. Right ICA patent distally without stenosis or evidence for dissection. Left carotid system: Left CCA patent from its origin to the bifurcation without stenosis. Moderate atheromatous irregularity and plaque present about the left carotid bulb/proximal left ICA with associated stenosis of up to approximately 60% by NASCET criteria (series 1095, image 4). Left ICA otherwise patent distally without stenosis or evidence for dissection. Vertebral arteries: Both vertebral arteries arise from the subclavian arteries. No proximal subclavian artery stenosis. Vertebral arteries are largely codominant widely patent without stenosis or evidence for dissection. Other: None IMPRESSION: MRI HEAD: 1. No acute intracranial abnormality. 2. Generalized cerebral atrophy with mild-to-moderate chronic small vessel ischemic disease with a few scattered remote lacunar infarcts involving the bilateral basal ganglia/corona radiata as well as the right dorsal pons. MRA HEAD: 1. Negative intracranial MRA for large vessel occlusion. 2. Moderate short-segment stenoses involving the right P1/P2 junction and distal left P2 segment. 3. Mild diffuse small vessel atheromatous  irregularity throughout the intracranial circulation. MRA NECK: 1. 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 2. No hemodynamically significant or correctable stenosis about the right carotid artery system. 3. Wide patency of both vertebral arteries within the neck. 4. No evidence for dissection or other acute vascular abnormality. Electronically Signed   By: Jeannine Boga M.D.   On: 02/22/2021 01:27   ECHOCARDIOGRAM COMPLETE BUBBLE STUDY  Result Date: 02/23/2021    ECHOCARDIOGRAM REPORT   Patient Name:   JEAN SKOW Date of Exam: 02/23/2021 Medical Rec #:  [id number]          Height:       74.0 in Accession #:    [id number]         Weight:       216.3 lb Date of Birth:  [birth year]          BSA:          2.247 m Patient Age:    76 years           BP:           152/82 mmHg Patient Gender: M                  HR:           78 bpm. Exam Location:  [facility] Procedure: 2D Echo, Color Doppler, Cardiac Doppler and Saline Contrast Bubble            Study Indications:     I63.9 Stroke  History:         Patient has prior history of Echocardiogram examinations.                  Stroke; Risk Factors:Hypertension.  Sonographer:     Charmayne Sheer Referring Phys:  [id number] Gretta Cool PATEL Diagnosing Phys: Yolonda Kida MD  Sonographer Comments: Suboptimal subcostal window. IMPRESSIONS  1. Left ventricular ejection fraction, by estimation, is 65 to 70%. The left ventricle has normal function. The left ventricle has no regional wall motion abnormalities. Left ventricular diastolic  parameters are consistent with Grade III diastolic dysfunction (restrictive).  2. Right ventricular systolic function is normal. The right ventricular size is normal.  3. The mitral valve is normal in structure. Trivial mitral valve regurgitation.  4. The aortic valve is normal in structure. Aortic valve regurgitation is not visualized. FINDINGS  Left Ventricle: Left ventricular ejection fraction, by  estimation, is 65 to 70%. The left ventricle has normal function. The left ventricle has no regional wall motion abnormalities. The left ventricular internal cavity size was normal in size. There is  no left ventricular hypertrophy. Left ventricular diastolic parameters are consistent with Grade III diastolic dysfunction (restrictive). Right Ventricle: The right ventricular size is normal. No increase in right ventricular wall thickness. Right ventricular systolic function is normal. Left Atrium: Left atrial size was normal in size. Right Atrium: Right atrial size was normal in size. Pericardium: There is no evidence of pericardial effusion. Mitral Valve: The mitral valve is normal in structure. Trivial mitral valve regurgitation. MV peak gradient, 3.1 mmHg. The mean mitral valve gradient is 2.0 mmHg. Tricuspid Valve: The tricuspid valve is normal in structure. Tricuspid valve regurgitation is mild. Aortic Valve: The aortic valve is normal in structure. Aortic valve regurgitation is not visualized. Aortic valve mean gradient measures 4.0 mmHg. Aortic valve peak gradient measures 7.2 mmHg. Aortic valve area, by VTI measures 4.02 cm. Pulmonic Valve: The pulmonic valve was normal in structure. Pulmonic valve regurgitation is not visualized. Aorta: The ascending aorta was not well visualized. IAS/Shunts: No atrial level shunt detected by color flow Doppler. Agitated saline contrast was given intravenously to evaluate for intracardiac shunting.  LEFT VENTRICLE PLAX 2D LVIDd:         4.78 cm   Diastology LVIDs:         3.06 cm   LV e' medial:    9.90 cm/s LV PW:         1.11 cm   LV E/e' medial:  8.9 LV IVS:        0.91 cm   LV e' lateral:   10.20 cm/s LVOT diam:     2.50 cm   LV E/e' lateral: 8.7 LV SV:         80 LV SV Index:   36 LVOT Area:     4.91 cm  RIGHT VENTRICLE RV Basal diam:  3.74 cm LEFT ATRIUM             Index        RIGHT ATRIUM           Index LA diam:        3.60 cm 1.60 cm/m   RA Area:     26.40 cm  LA Vol (A2C):   51.4 ml 22.88 ml/m  RA Volume:   83.60 ml  37.21 ml/m LA Vol (A4C):   50.6 ml 22.52 ml/m LA Biplane Vol: 53.0 ml 23.59 ml/m  AORTIC VALVE AV Area (Vmax):    3.25 cm AV Area (Vmean):   3.63 cm AV Area (VTI):     4.02 cm AV Vmax:           134.00 cm/s AV Vmean:          85.900 cm/s AV VTI:            0.199 m AV Peak Grad:      7.2 mmHg AV Mean Grad:      4.0 mmHg LVOT Vmax:         88.80 cm/s  LVOT Vmean:        63.500 cm/s LVOT VTI:          0.163 m LVOT/AV VTI ratio: 0.82  AORTA Ao Root diam: 3.20 cm MITRAL VALVE MV Area (PHT): 3.54 cm    SHUNTS MV Area VTI:   6.56 cm    Systemic VTI:  0.16 m MV Peak grad:  3.1 mmHg    Systemic Diam: 2.50 cm MV Mean grad:  2.0 mmHg MV Vmax:       0.88 m/s MV Vmean:      58.3 cm/s MV Decel Time: 214 msec MV E velocity: 88.25 cm/s MV A velocity: 34.30 cm/s MV E/A ratio:  2.57 Dwayne D Callwood MD Electronically signed by Yolonda Kida MD Signature Date/Time: 02/23/2021/2:05:37 PM    Final    DG FL GUIDED LUMBAR PUNCTURE  Result Date: 02/25/2021 CLINICAL DATA:  Chronic right shoulder pain EXAM: DIAGNOSTIC LUMBAR PUNCTURE UNDER FLUOROSCOPIC GUIDANCE COMPARISON:  None FLUOROSCOPY TIME:  Fluoroscopy Time:  0.3 minute Radiation Exposure Index (if provided by the fluoroscopic device): 3.5 mGy Number of Acquired Spot Images: 0 PROCEDURE: Informed consent was obtained from the patient prior to the procedure, including potential complications of headache, allergy, and pain. With the patient prone, the lower back was prepped with Betadine. 1% Lidocaine was used for local anesthesia. Lumbar puncture was performed at the L5-S1 level using a 22 gauge needle with return of serosanguineous CSF which remained serosanguineous throughout the entire collection. 6 ml of CSF were obtained for laboratory studies. The patient tolerated the procedure well and there were no apparent complications. IMPRESSION: Successful fluoroscopic guided lumbar puncture. Electronically Signed    By: Kathreen Devoid M.D.   On: 02/25/2021 12:51   DG FL GUIDED LUMBAR PUNCTURE  Result Date: 02/24/2021 CLINICAL DATA:  Patient with left eye ptosis and diplopia concern for meningitis request received for diagnostic lumbar puncture. EXAM: DIAGNOSTIC LUMBAR PUNCTURE UNDER FLUOROSCOPIC GUIDANCE COMPARISON:  CT head imaging performed 02/21/2021 and MRI brain imaging performed 02/22/2021 report reviewed prior to the procedure. FLUOROSCOPY TIME:  Fluoroscopy Time:  0.3 minute Radiation Exposure Index (if provided by the fluoroscopic device): 9.20 mGy Number of Acquired Spot Images: 3 PROCEDURE: Informed consent was obtained from the patient/patient's legal guardian prior to the procedure, including potential complications of bleeding, infection, paresthesias, nerve damage, CSF leak requiring additional procedures, post procedure requirement to lay flat for several hours after the procedure, headache, allergy, and pain. With the patient prone, the lower back was prepped with Betadine. 1% Lidocaine was used for local anesthesia. Lumbar puncture was performed at the L5-S1 level using a 20 gauge needle with return of slightly blood-tinged CSF secondary to patient moving. Six ml of CSF were obtained for laboratory studies, unable to collect additional fluid due to low CSF pressure. The inner stylet was placed back into the needle and the needle was removed in its entirety. The patient tolerated the procedure well and there were no apparent complications. A sterile bandage was applied. IMPRESSION: Technically successful fluoroscopic guided lumbar puncture. This exam was performed by Tsosie Billing PA-C, and was supervised and interpreted by Dr. Nyoka Cowden. Electronically Signed   By: Marijo Conception M.D.   On: 02/24/2021 12:12   MR ORBITS W WO CONTRAST  Result Date: 02/23/2021 CLINICAL DATA:  Left ophthalmoplegia EXAM: MRI OF THE ORBITS WITHOUT AND WITH CONTRAST TECHNIQUE: Multiplanar, multi-echo pulse sequences of the orbits  and surrounding structures were acquired including fat saturation techniques, before and after  intravenous contrast administration. CONTRAST:  7.77mL GADAVIST GADOBUTROL 1 MMOL/ML IV SOLN COMPARISON:  MRI head 02/22/2021 FINDINGS: Orbits: No orbital mass or evidence of inflammation. Normal appearance of the globes, optic nerve-sheath complexes, extraocular muscles, orbital fat and lacrimal glands. Visualized sinuses: Clear. Soft tissues: Normal. Limited intracranial: Enhancement of the left third cranial nerve, extending from the cisternal portion to the cavernous sinus portion (series 14, images 11-17). The nerve is not significantly enlarged. 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 Electronically Signed   By: Merilyn Baba M.D.   On: 02/23/2021 04:09    Labs: BNP (last 3 results) No results for input(s): BNP in the last 8760 hours. Basic Metabolic Panel: Recent Labs  Lab 02/21/21 1813 02/24/21 0930 02/25/21 0743  NA 136 136 136  K 3.6 3.4* 4.0  CL 104 102 106  CO2 $Re'24 25 25  'KaP$ GLUCOSE 139* 110* 98  BUN 17 31* 25*  CREATININE 1.24 1.44* 1.13  CALCIUM 9.0 9.3 8.3*  MG  --  2.0  --    Liver Function Tests: Recent Labs  Lab 02/21/21 1813  AST 21  ALT 16  ALKPHOS 68  BILITOT 1.4*  PROT 7.3  ALBUMIN 3.8   No results for input(s): LIPASE, AMYLASE in the last 168 hours. No results for input(s): AMMONIA in the last 168 hours. CBC: Recent Labs  Lab 02/21/21 1813 02/24/21 0930  WBC 7.8 9.2  NEUTROABS 5.2  --   HGB 15.6 15.0  HCT 47.1 45.6  MCV 91.1 90.7  PLT 254 257   Cardiac Enzymes: No results for input(s): CKTOTAL, CKMB, CKMBINDEX, TROPONINI in the last 168 hours. BNP: Invalid input(s): POCBNP CBG: Recent Labs  Lab 02/21/21 2048  GLUCAP 113*   D-Dimer No results for input(s): DDIMER in the last 72 hours. Hgb A1c No results for input(s): HGBA1C in the last 72 hours. Lipid Profile No  results for input(s): CHOL, HDL, LDLCALC, TRIG, CHOLHDL, LDLDIRECT in the last 72 hours. Thyroid function studies No results for input(s): TSH, T4TOTAL, T3FREE, THYROIDAB in the last 72 hours.  Invalid input(s): FREET3 Anemia work up No results for input(s): VITAMINB12, FOLATE, FERRITIN, TIBC, IRON, RETICCTPCT in the last 72 hours. Urinalysis    Component Value Date/Time   COLORURINE YELLOW (A) 07/20/2019 0525   APPEARANCEUR HAZY (A) 07/20/2019 0525   LABSPEC 1.028 07/20/2019 0525   PHURINE 5.0 07/20/2019 0525   GLUCOSEU NEGATIVE 07/20/2019 0525   HGBUR NEGATIVE 07/20/2019 0525   BILIRUBINUR NEGATIVE 07/20/2019 0525   KETONESUR NEGATIVE 07/20/2019 0525   PROTEINUR NEGATIVE 07/20/2019 0525   NITRITE NEGATIVE 07/20/2019 0525   LEUKOCYTESUR NEGATIVE 07/20/2019 0525   Sepsis Labs Invalid input(s): PROCALCITONIN,  WBC,  LACTICIDVEN Microbiology Recent Results (from the past 240 hour(s))  Resp Panel by RT-PCR (Flu A&B, Covid) Nasopharyngeal Swab     Status: None   Collection Time: 02/24/21  9:30 AM   Specimen: Nasopharyngeal Swab; Nasopharyngeal(NP) swabs in vial transport medium  Result Value Ref Range Status   SARS Coronavirus 2 by RT PCR NEGATIVE NEGATIVE Final    Comment: (NOTE) SARS-CoV-2 target nucleic acids are NOT DETECTED.  The SARS-CoV-2 RNA is generally detectable in upper respiratory specimens during the acute phase of infection. The lowest concentration of SARS-CoV-2 viral copies this assay can detect is 138 copies/mL. A negative result does not preclude SARS-Cov-2 infection and should not be used as the sole basis for treatment or other patient management  decisions. A negative result may occur with  improper specimen collection/handling, submission of specimen other than nasopharyngeal swab, presence of viral mutation(s) within the areas targeted by this assay, and inadequate number of viral copies(<138 copies/mL). A negative result must be combined with clinical  observations, patient history, and epidemiological information. The expected result is Negative.  Fact Sheet for Patients:  EntrepreneurPulse.com.au  Fact Sheet for Healthcare Providers:  IncredibleEmployment.be  This test is no t yet approved or cleared by the [place] FDA and  has been authorized for detection and/or diagnosis of SARS-CoV-2 by FDA under an Emergency Use Authorization (EUA). This EUA will remain  in effect (meaning this test can be used) for the duration of the COVID-19 declaration under Section 564(b)(1) of the Act, 21 U.S.C.section 360bbb-3(b)(1), unless the authorization is terminated  or revoked sooner.       Influenza A by PCR NEGATIVE NEGATIVE Final   Influenza B by PCR NEGATIVE NEGATIVE Final    Comment: (NOTE) The Xpert Xpress SARS-CoV-2/FLU/RSV plus assay is intended as an aid in the diagnosis of influenza from Nasopharyngeal swab specimens and should not be used as a sole basis for treatment. Nasal washings and aspirates are unacceptable for Xpert Xpress SARS-CoV-2/FLU/RSV testing.  Fact Sheet for Patients: EntrepreneurPulse.com.au  Fact Sheet for Healthcare Providers: IncredibleEmployment.be  This test is not yet approved or cleared by the [place] FDA and has been authorized for detection and/or diagnosis of SARS-CoV-2 by FDA under an Emergency Use Authorization (EUA). This EUA will remain in effect (meaning this test can be used) for the duration of the COVID-19 declaration under Section 564(b)(1) of the Act, 21 U.S.C. section 360bbb-3(b)(1), unless the authorization is terminated or revoked.  Performed at [facility], [address] [place], [place] [address]   CSF culture w Gram Stain     Status: None (Preliminary result)   Collection Time: 02/24/21 11:16 AM   Specimen: PATH Cytology CSF; Cerebrospinal Fluid  Result Value Ref Range Status    Specimen Description   Final    CSF Performed at [facility], [address], [place], [place] [address]    Special Requests   Final    NONE Performed at [facility], [place], [place] [address]    Gram Stain   Final    NO ORGANISMS SEEN WBC SEEN RED BLOOD CELLS GRAM STAIN REVIEWED-AGREE WITH RESULT    Culture   Final    NO GROWTH < 12 HOURS Performed at [facility], [place] [address], [place], [place] [address]    Report Status PENDING  Incomplete  CSF culture w Gram Stain     Status: None (Preliminary result)   Collection Time: 02/25/21 12:05 PM   Specimen: PATH Cytology CSF; Cerebrospinal Fluid  Result Value Ref Range Status   Specimen Description CSF  Final   Special Requests NONE  Final   Gram Stain   Final    NO ORGANISMS SEEN WBC SEEN Performed at [facility], [address], [place],  [address]    Culture PENDING  Incomplete   Report Status PENDING  Incomplete     Time coordinating discharge: 25 minutes  SIGNED: Antonieta Pert, MD  [facility] 02/25/2021, 4:21 PM  If 7PM-7AM, please contact night-coverage [email or web address]

## 2021-02-25 NOTE — Progress Notes (Addendum)
Pt is requesting something to help with moving bowels. NP Foust made aware. Will continue to monitor.  Update 0149: Np Foust ordered miralax/glycolax 14 g packet oral daily. Will continue to monitor.  Update 0257: Miralax administered. Will notify incoming shift. Will continue to monitor.

## 2021-02-26 LAB — CYTOLOGY - NON PAP

## 2021-02-26 LAB — OLIGOCLONAL BANDS, CSF + SERM

## 2021-02-27 LAB — VDRL, CSF: VDRL Quant, CSF: NONREACTIVE

## 2021-02-28 LAB — CSF CULTURE W GRAM STAIN
Culture: NO GROWTH
Culture: NO GROWTH
Gram Stain: NONE SEEN
Gram Stain: NONE SEEN

## 2021-03-02 LAB — VDRL, CSF: VDRL Quant, CSF: NONREACTIVE

## 2021-03-02 LAB — OLIGOCLONAL BANDS, CSF + SERM

## 2021-03-05 LAB — IGG CSF INDEX
Albumin CSF-mCnc: 52 mg/dL (ref 15–55)
Albumin: 4.2 g/dL (ref 3.7–4.7)
CSF IgG Index: >26.7 — ABNORMAL HIGH (ref 0.0–0.7)
IgG (Immunoglobin G), Serum: <30 mg/dL — ABNORMAL LOW (ref 603–1613)
IgG, CSF: 9.9 mg/dL (ref 0.0–10.3)
IgG/Alb Ratio, CSF: 0.19 (ref 0.00–0.25)

## 2021-03-07 LAB — LYME DISEASE DNA BY PCR(BORRELIA BURG): Lyme Disease(B.burgdorferi)PCR: NEGATIVE

## 2021-03-24 LAB — FUNGUS CULTURE WITH STAIN

## 2021-03-24 LAB — FUNGUS CULTURE RESULT

## 2021-03-24 LAB — FUNGAL ORGANISM REFLEX

## 2021-03-25 LAB — FUNGUS CULTURE WITH STAIN

## 2021-03-25 LAB — FUNGUS CULTURE RESULT

## 2021-03-25 LAB — FUNGAL ORGANISM REFLEX

## 2021-03-28 ENCOUNTER — Other Ambulatory Visit: Payer: Self-pay | Admitting: Student in an Organized Health Care Education/Training Program

## 2021-04-04 IMAGING — CR DG CHEST 2V
2 series · 2 of 2 positions shown · non-contrast
Comparison: None.

CLINICAL DATA: Fatigue and dizziness

EXAM:
CHEST - 2 VIEW

[chest pa]
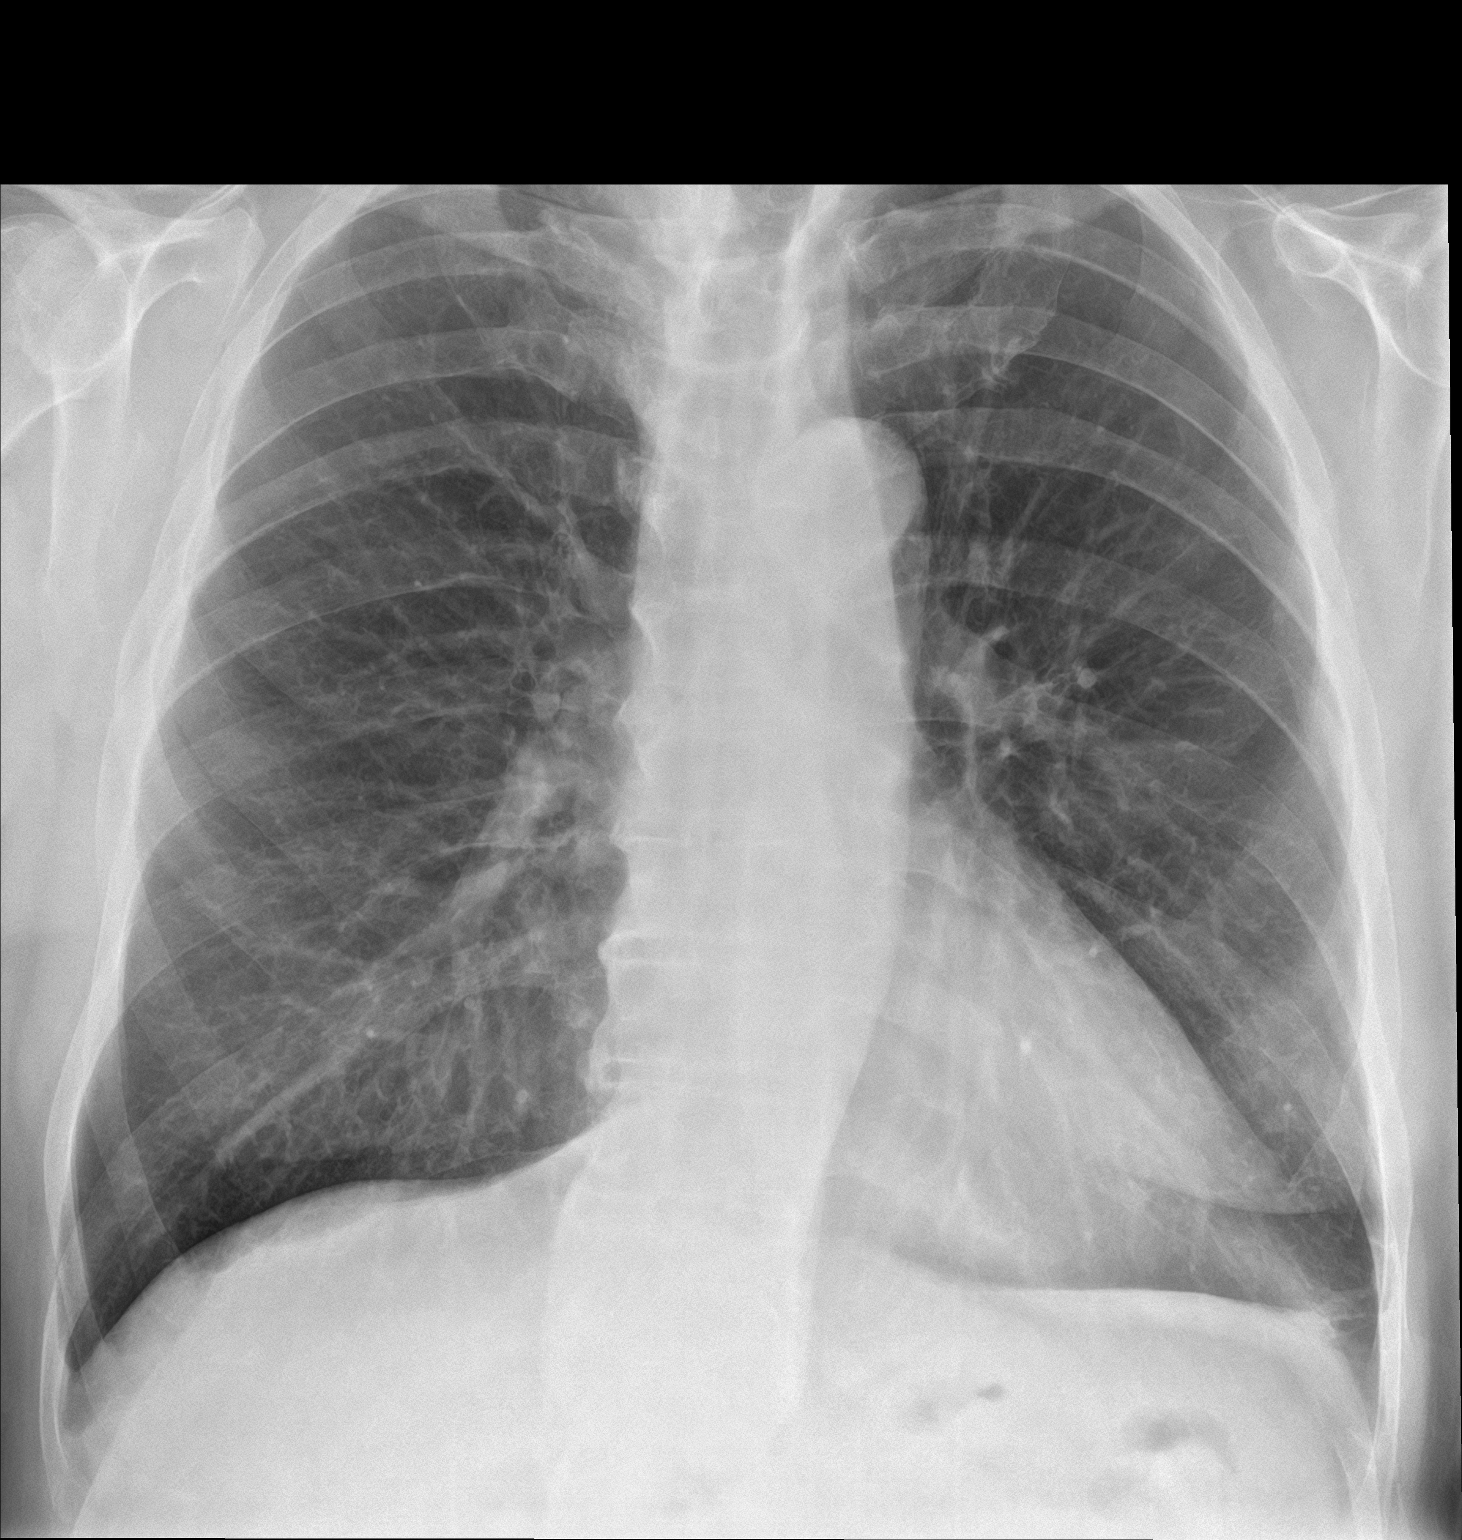

[chest lat]
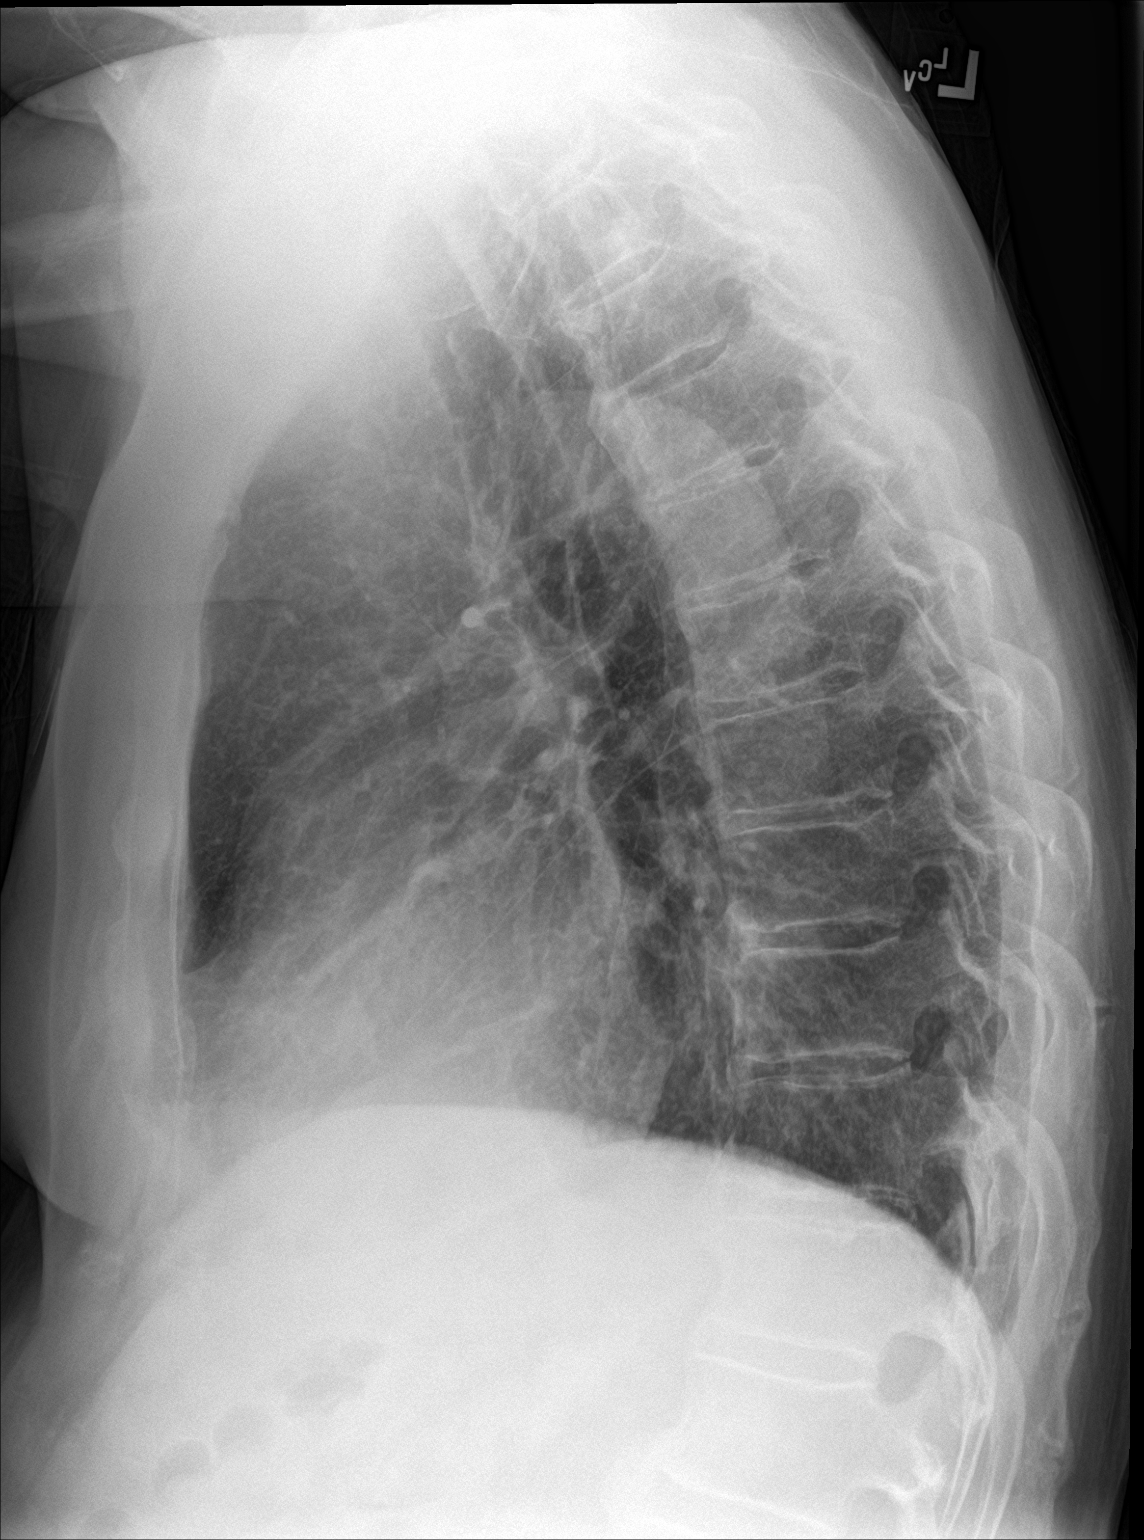

[2 of 2 positions shown; findings below may reference images not displayed]

FINDINGS: Normal mediastinum and cardiac silhouette. Normal pulmonary
vasculature. No evidence of effusion, infiltrate, or pneumothorax.
No acute bony abnormality. Lungs are hyperinflated. Small focus of
scarring at the LEFT lung base.
IMPRESSION: No acute cardiopulmonary process.

## 2021-06-14 NOTE — Anesthesia Preprocedure Evaluation (Addendum)
Anesthesia Evaluation  ?Patient identified by MRN, date of birth, ID band ?Patient awake ? ? ? ?Reviewed: ?Allergy & Precautions, NPO status , Patient's Chart, lab work & pertinent test results ? ?History of Anesthesia Complications ?Negative for: history of anesthetic complications ? ?Airway ?Mallampati: IV ? ? ?Neck ROM: Full ? ? ? Dental ? ?(+) Missing ?  ?Pulmonary ?sleep apnea , former smoker (quit 2 years ago),  ?  ?Pulmonary exam normal ?breath sounds clear to auscultation ? ? ? ? ? ? Cardiovascular ?hypertension, + dysrhythmias (a fib on Eliquis)  ?Rhythm:Irregular Rate:Normal ? ?ECG 05/12/21:  ?NORMAL LEFT VENTRICULAR SYSTOLIC FUNCTION  ?NORMAL RIGHT VENTRICULAR SYSTOLIC FUNCTION  ?NO VALVULAR STENOSIS  ?MILD MR, TR  ?TRIVIAL PR  ?EF 55%  ? ?ECG 05/06/21:  ??Normal myocardial perfusion scan no evidence of stress-induced myocardial ischemia ejection fraction 56% conclusion negative scan ?  ?Neuro/Psych ?TIA  ? GI/Hepatic ?negative GI ROS, Neg liver ROS,   ?Endo/Other  ?negative endocrine ROS ? Renal/GU ?negative Renal ROS  ? ?  ?Musculoskeletal ? ? Abdominal ?  ?Peds ? Hematology ?negative hematology ROS ?(+)   ?Anesthesia Other Findings ? ? Reproductive/Obstetrics ? ?  ? ? ? ? ? ? ? ? ? ? ? ? ? ?  ?  ? ? ? ? ? ? ? ?Anesthesia Physical ?Anesthesia Plan ? ?ASA: 3 ? ?Anesthesia Plan: General  ? ?Post-op Pain Management:   ? ?Induction: Intravenous ? ?PONV Risk Score and Plan: 1 and Propofol infusion, TIVA and Treatment may vary due to age or medical condition ? ?Airway Management Planned: Natural Airway ? ?Additional Equipment:  ? ?Intra-op Plan:  ? ?Post-operative Plan:  ? ?Informed Consent: I have reviewed the patients History and Physical, chart, labs and discussed the procedure including the risks, benefits and alternatives for the proposed anesthesia with the patient or authorized representative who has indicated his/her understanding and acceptance.  ? ? ? ? ? ?Plan  Discussed with: CRNA ? ?Anesthesia Plan Comments: (LMA/GETA backup discussed.  Patient consented for risks of anesthesia including but not limited to:  ?- adverse reactions to medications ?- damage to eyes, teeth, lips or other oral mucosa ?- nerve damage due to positioning  ?- sore throat or hoarseness ?- damage to heart, brain, nerves, lungs, other parts of body or loss of life ? ?Informed patient about role of CRNA in peri- and intra-operative care.  Patient voiced understanding.)  ? ? ? ? ? ?Anesthesia Quick Evaluation ? ?

## 2021-06-15 ENCOUNTER — Encounter
Admission: RE | Disposition: A | Discharge status: Home / Self Care | Payer: Self-pay | Source: Home / Self Care | Attending: Internal Medicine

## 2021-06-15 ENCOUNTER — Ambulatory Visit: Payer: Medicare Other | Admitting: Anesthesiology

## 2021-06-15 ENCOUNTER — Other Ambulatory Visit: Payer: Self-pay

## 2021-06-15 ENCOUNTER — Ambulatory Visit
Admission: RE | Admit: 2021-06-15 | Discharge: 2021-06-15 | Disposition: A | Discharge status: Home / Self Care | Payer: Medicare Other | Attending: Internal Medicine | Admitting: Internal Medicine

## 2021-06-15 ENCOUNTER — Encounter: Payer: Self-pay | Admitting: Internal Medicine

## 2021-06-15 DIAGNOSIS — I4891 Unspecified atrial fibrillation: Secondary | ICD-10-CM | POA: Insufficient documentation

## 2021-06-15 DIAGNOSIS — I444 Left anterior fascicular block: Secondary | ICD-10-CM | POA: Diagnosis not present

## 2021-06-15 DIAGNOSIS — I491 Atrial premature depolarization: Secondary | ICD-10-CM | POA: Diagnosis not present

## 2021-06-15 HISTORY — PX: CARDIOVERSION: SHX1299

## 2021-06-15 SURGERY — CARDIOVERSION
Anesthesia: General

## 2021-06-15 MED ORDER — PROPOFOL 10 MG/ML IV BOLUS
INTRAVENOUS | Status: AC
  Filled 2021-06-15: qty 20

## 2021-06-15 MED ORDER — SODIUM CHLORIDE 0.9 % IV SOLN: INTRAVENOUS | Status: DC

## 2021-06-15 MED ORDER — PROPOFOL 10 MG/ML IV BOLUS
INTRAVENOUS | Status: DC | PRN
  Administered 2021-06-15: 80 mg via INTRAVENOUS

## 2021-06-15 NOTE — Anesthesia Postprocedure Evaluation (Signed)
Anesthesia Post Note ? ?Patient: Jacob Velasquez ? ?Procedure(s) Performed: CARDIOVERSION ? ?Patient location during evaluation: PACU ?Anesthesia Type: General ?Level of consciousness: awake and alert, oriented and patient cooperative ?Pain management: pain level controlled ?Vital Signs Assessment: post-procedure vital signs reviewed and stable ?Respiratory status: spontaneous breathing, nonlabored ventilation and respiratory function stable ?Cardiovascular status: blood pressure returned to baseline and stable ?Postop Assessment: adequate PO intake ?Anesthetic complications: no ? ? ?No notable events documented. ? ? ?Last Vitals:  ?Vitals:  ? 06/15/21 0800 06/15/21 0815  ?BP: 124/86 (!) 151/99  ?Pulse: 78 78  ?Resp: 18 12  ?Temp:    ?SpO2: 96% 97%  ?  ?Last Pain:  ?Vitals:  ? 06/15/21 0815  ?TempSrc:   ?PainSc: 0-No pain  ? ? ?  ?  ?  ?  ?  ?  ? ?Darrin Nipper ? ? ? ? ?

## 2021-06-15 NOTE — CV Procedure (Signed)
Electrical Cardioversion Procedure Note ? ? ?Procedure: Electrical Cardioversion ?Indications:  Atrial Fibrillation ? ?Procedure Details ?Consent: Risks of procedure as well as the alternatives and risks of each were explained to the (patient/caregiver).  Consent for procedure obtained. ?Time Out: Verified patient identification, verified procedure, site/side was marked, verified correct patient position, special equipment/implants available, medications/allergies/relevent history reviewed, required imaging and test results available.  Performed ? ?Patient placed on cardiac monitor, pulse oximetry, supplemental oxygen as necessary.  ?Sedation given:  As per anesthesia with propofol ?Pacer pads placed anterior and posterior chest. ? ?Cardioverted 1 time(s).  ?Cardioverted at 120J. ? ?Evaluation ?Findings: Post procedure EKG shows: NSR ?Complications: None ?Patient did tolerate procedure well. ? ? ? ?Lujean Amel MD ?06/12/2014, 8:02 AM  ?

## 2021-06-15 NOTE — Transfer of Care (Signed)
Immediate Anesthesia Transfer of Care Note ? ?Patient: Jacob Velasquez ? ?Procedure(s) Performed: CARDIOVERSION ? ?Patient Location: Cardiology Short Stay Procedure Area ? ?Anesthesia Type:General ? ?Level of Consciousness: drowsy ? ?Airway & Oxygen Therapy: Patient Spontanous Breathing and Patient connected to nasal cannula oxygen ? ?Post-op Assessment: Report given to RN, Post -op Vital signs reviewed and stable and Patient moving all extremities ? ?Post vital signs: Reviewed and stable ? ?Last Vitals:  ?Vitals Value Taken Time  ?BP 124/86 06/15/21 0745  ?Temp    ?Pulse 78 06/15/21 0749  ?Resp 25 06/15/21 0749  ?SpO2 97 % 06/15/21 0749  ?Vitals shown include unvalidated device data. ? ?Last Pain:  ?Vitals:  ? 06/15/21 0716  ?TempSrc: Oral  ?PainSc: 0-No pain  ?   ? ?  ? ?Complications: No notable events documented. ?

## 2021-06-16 ENCOUNTER — Encounter: Payer: Self-pay | Admitting: Internal Medicine

## 2022-12-08 ENCOUNTER — Other Ambulatory Visit: Payer: Self-pay | Admitting: Nephrology

## 2022-12-08 DIAGNOSIS — N1831 Chronic kidney disease, stage 3a: Secondary | ICD-10-CM

## 2022-12-14 ENCOUNTER — Ambulatory Visit: Payer: Medicare Other

## 2023-04-12 MED ORDER — SODIUM CHLORIDE 0.9 % IV SOLN: INTRAVENOUS | Status: DC

## 2023-04-13 ENCOUNTER — Encounter: Payer: Self-pay | Admitting: Certified Registered Nurse Anesthetist

## 2023-04-13 ENCOUNTER — Encounter
Admission: RE | Disposition: A | Discharge status: Home / Self Care | Payer: Self-pay | Source: Home / Self Care | Attending: Internal Medicine

## 2023-04-13 ENCOUNTER — Ambulatory Visit
Admission: RE | Admit: 2023-04-13 | Discharge: 2023-04-13 | Disposition: A | Discharge status: Home / Self Care | Attending: Internal Medicine | Admitting: Internal Medicine

## 2023-04-13 ENCOUNTER — Other Ambulatory Visit: Payer: Self-pay

## 2023-04-13 DIAGNOSIS — Z538 Procedure and treatment not carried out for other reasons: Secondary | ICD-10-CM | POA: Insufficient documentation

## 2023-04-13 DIAGNOSIS — J449 Chronic obstructive pulmonary disease, unspecified: Secondary | ICD-10-CM | POA: Diagnosis not present

## 2023-04-13 DIAGNOSIS — I2510 Atherosclerotic heart disease of native coronary artery without angina pectoris: Secondary | ICD-10-CM | POA: Insufficient documentation

## 2023-04-13 DIAGNOSIS — I482 Chronic atrial fibrillation, unspecified: Secondary | ICD-10-CM | POA: Insufficient documentation

## 2023-04-13 DIAGNOSIS — Z7901 Long term (current) use of anticoagulants: Secondary | ICD-10-CM | POA: Diagnosis not present

## 2023-04-13 DIAGNOSIS — Z8673 Personal history of transient ischemic attack (TIA), and cerebral infarction without residual deficits: Secondary | ICD-10-CM | POA: Insufficient documentation

## 2023-04-13 DIAGNOSIS — Z87891 Personal history of nicotine dependence: Secondary | ICD-10-CM | POA: Insufficient documentation

## 2023-04-13 DIAGNOSIS — E782 Mixed hyperlipidemia: Secondary | ICD-10-CM | POA: Insufficient documentation

## 2023-04-13 DIAGNOSIS — I4819 Other persistent atrial fibrillation: Secondary | ICD-10-CM

## 2023-04-13 DIAGNOSIS — I10 Essential (primary) hypertension: Secondary | ICD-10-CM | POA: Diagnosis not present

## 2023-04-13 DIAGNOSIS — Z79899 Other long term (current) drug therapy: Secondary | ICD-10-CM | POA: Diagnosis not present

## 2023-04-13 HISTORY — PX: CARDIOVERSION: SHX1299

## 2023-04-13 SURGERY — CARDIOVERSION
Anesthesia: General

## 2023-04-13 NOTE — Progress Notes (Signed)
 On arrival to specials for cardioversion, pt has eaten breakfast at 0700. He also does not have transportation home. Anesthesia talked with pt and pt is rescheduling. Dr. Juliann Pares aware. Educated pt for next time.

## 2023-04-13 NOTE — Anesthesia Preprocedure Evaluation (Addendum)
 Anesthesia Evaluation    Airway        Dental   Pulmonary sleep apnea , former smoker (quit 4 years ago)          Cardiovascular hypertension, + dysrhythmias (a fib on Eliquis)   Echo 05/12/21:  NORMAL LEFT VENTRICULAR SYSTOLIC FUNCTION  NORMAL RIGHT VENTRICULAR SYSTOLIC FUNCTION  NO VALVULAR STENOSIS  MILD MR, TR  TRIVIAL PR  EF 55%   Myocardial perfusion 05/06/21: Normal myocardial perfusion scan no evidence of stress-induced myocardial ischemia ejection fraction 56% conclusion negative scan     Neuro/Psych TIACVA    GI/Hepatic   Endo/Other    Renal/GU      Musculoskeletal   Abdominal   Peds  Hematology   Anesthesia Other Findings   Reproductive/Obstetrics                             Anesthesia Physical Anesthesia Plan  ASA: 3  Anesthesia Plan: General   Post-op Pain Management:    Induction:   PONV Risk Score and Plan:   Airway Management Planned:   Additional Equipment:   Intra-op Plan:   Post-operative Plan:   Informed Consent:   Plan Discussed with:   Anesthesia Plan Comments: (Case canceled in preop for NPO violation and no ride home.  Pt will reschedule.)        Anesthesia Quick Evaluation

## 2023-04-14 ENCOUNTER — Encounter: Payer: Self-pay | Admitting: Internal Medicine

## 2023-04-19 ENCOUNTER — Ambulatory Visit
Admission: RE | Admit: 2023-04-19 | Discharge: 2023-04-19 | Disposition: A | Discharge status: Home / Self Care | Attending: Internal Medicine | Admitting: Internal Medicine

## 2023-04-19 ENCOUNTER — Ambulatory Visit: Admitting: Anesthesiology

## 2023-04-19 ENCOUNTER — Encounter: Payer: Self-pay | Admitting: Internal Medicine

## 2023-04-19 ENCOUNTER — Encounter
Admission: RE | Disposition: A | Discharge status: Home / Self Care | Payer: Self-pay | Source: Home / Self Care | Attending: Internal Medicine

## 2023-04-19 ENCOUNTER — Other Ambulatory Visit: Payer: Self-pay

## 2023-04-19 DIAGNOSIS — Z7901 Long term (current) use of anticoagulants: Secondary | ICD-10-CM | POA: Insufficient documentation

## 2023-04-19 DIAGNOSIS — Z79899 Other long term (current) drug therapy: Secondary | ICD-10-CM | POA: Insufficient documentation

## 2023-04-19 DIAGNOSIS — I4891 Unspecified atrial fibrillation: Secondary | ICD-10-CM | POA: Diagnosis not present

## 2023-04-19 DIAGNOSIS — Z87891 Personal history of nicotine dependence: Secondary | ICD-10-CM | POA: Diagnosis not present

## 2023-04-19 DIAGNOSIS — I10 Essential (primary) hypertension: Secondary | ICD-10-CM | POA: Diagnosis not present

## 2023-04-19 DIAGNOSIS — J449 Chronic obstructive pulmonary disease, unspecified: Secondary | ICD-10-CM | POA: Insufficient documentation

## 2023-04-19 DIAGNOSIS — I491 Atrial premature depolarization: Secondary | ICD-10-CM | POA: Insufficient documentation

## 2023-04-19 DIAGNOSIS — Z01810 Encounter for preprocedural cardiovascular examination: Secondary | ICD-10-CM | POA: Diagnosis not present

## 2023-04-19 HISTORY — DX: Fusion of spine, site unspecified: M43.20

## 2023-04-19 HISTORY — DX: Chronic obstructive pulmonary disease, unspecified: J44.9

## 2023-04-19 HISTORY — PX: CARDIOVERSION: SHX1299

## 2023-04-19 SURGERY — CARDIOVERSION
Anesthesia: General

## 2023-04-19 MED ORDER — SODIUM CHLORIDE 0.9 % IV SOLN: INTRAVENOUS | Status: DC

## 2023-04-19 MED ORDER — PROPOFOL 10 MG/ML IV BOLUS
INTRAVENOUS | Status: DC | PRN
  Administered 2023-04-19: 20 mg via INTRAVENOUS
  Administered 2023-04-19: 60 mg via INTRAVENOUS

## 2023-04-19 NOTE — Anesthesia Preprocedure Evaluation (Signed)
 Anesthesia Evaluation  Patient identified by MRN, date of birth, ID band Patient awake    Reviewed: Allergy & Precautions, NPO status , Patient's Chart, lab work & pertinent test results  History of Anesthesia Complications Negative for: history of anesthetic complications  Airway Mallampati: III  TM Distance: <3 FB Neck ROM: full    Dental  (+) Chipped, Poor Dentition   Pulmonary neg shortness of breath, COPD, former smoker   Pulmonary exam normal        Cardiovascular Exercise Tolerance: Good hypertension, (-) angina (-) Past MI  Rhythm:irregular Rate:Normal     Neuro/Psych TIA Neuromuscular disease CVA  negative psych ROS   GI/Hepatic negative GI ROS, Neg liver ROS,,,  Endo/Other  negative endocrine ROS    Renal/GU Renal disease  negative genitourinary   Musculoskeletal   Abdominal   Peds  Hematology negative hematology ROS (+)   Anesthesia Other Findings Past Medical History: No date: Atrial fibrillation (HCC) No date: COPD (chronic obstructive pulmonary disease) (HCC) No date: Fusion of spine No date: Hypertension No date: TIA (transient ischemic attack)  Past Surgical History: 06/15/2021: CARDIOVERSION; N/A     Comment:  Procedure: CARDIOVERSION;  Surgeon: Alwyn Pea,               MD;  Location: [facility] ORS;  Service: Cardiovascular;                Laterality: N/A; 04/13/2023: CARDIOVERSION; N/A     Comment:  Procedure: CARDIOVERSION;  Surgeon: Alwyn Pea,               MD;  Location: [facility] ORS;  Service: Cardiovascular;                Laterality: N/A; 07/30/2020: SPINAL FUSION; N/A     Comment:  Procedure: FUSION POSTERIOR SPINAL MULTILEVEL Posterior               Thoracic Fusion T7-T11;  Surgeon: Venetia Night, MD;              Location: [facility] ORS;  Service: Neurosurgery;  Laterality:               N/A;  BMI    Body Mass Index: 26.91 kg/m      Reproductive/Obstetrics negative  OB ROS                             Anesthesia Physical Anesthesia Plan  ASA: 3  Anesthesia Plan: General   Post-op Pain Management:    Induction: Intravenous  PONV Risk Score and Plan: Propofol infusion and TIVA  Airway Management Planned: Natural Airway and Nasal Cannula  Additional Equipment:   Intra-op Plan:   Post-operative Plan:   Informed Consent: I have reviewed the patients History and Physical, chart, labs and discussed the procedure including the risks, benefits and alternatives for the proposed anesthesia with the patient or authorized representative who has indicated his/her understanding and acceptance.     Dental Advisory Given  Plan Discussed with: Anesthesiologist, CRNA and Surgeon  Anesthesia Plan Comments: (Patient consented for risks of anesthesia including but not limited to:  - adverse reactions to medications - risk of airway placement if required - damage to eyes, teeth, lips or other oral mucosa - nerve damage due to positioning  - sore throat or hoarseness - Damage to heart, brain, nerves, lungs, other parts of body or loss of life  Patient voiced understanding and assent.)  Anesthesia Quick Evaluation

## 2023-04-19 NOTE — Transfer of Care (Signed)
 Immediate Anesthesia Transfer of Care Note  Patient: Jacob Velasquez  Procedure(s) Performed: CARDIOVERSION  Patient Location:  spu  Anesthesia Type:General  Level of Consciousness: awake, alert , and oriented  Airway & Oxygen Therapy: Patient Spontanous Breathing and Patient connected to nasal cannula oxygen  Post-op Assessment: Report given to RN and Post -op Vital signs reviewed and stable  Post vital signs: Reviewed  Last Vitals:  Vitals Value Taken Time  BP 134/76 04/19/23 1205  Temp    Pulse 53 04/19/23 1207  Resp 22 04/19/23 1207  SpO2 98 % 04/19/23 1207    Last Pain:  Vitals:   04/19/23 1108  PainSc: 0-No pain         Complications: No notable events documented.

## 2023-04-19 NOTE — CV Procedure (Signed)
 Electrical Cardioversion Procedure Note   Procedure: Electrical Cardioversion Indications:  Atrial Fibrillation  Procedure Details Consent: Risks of procedure as well as the alternatives and risks of each were explained to the (patient/caregiver).  Consent for procedure obtained. Time Out: Verified patient identification, verified procedure, site/side was marked, verified correct patient position, special equipment/implants available, medications/allergies/relevent history reviewed, required imaging and test results available.  Performed  Patient placed on cardiac monitor, pulse oximetry, supplemental oxygen as necessary.  Sedation given:  Propofol as per anesthesia Pacer pads placed anterior and posterior chest.  Cardioverted 1 time(s).  Cardioverted at 120J.  Evaluation Findings: Post procedure EKG shows: NSR Complications: None Patient did tolerate procedure well.  Jacob Peng MD 04/19/2023

## 2023-04-19 NOTE — Anesthesia Postprocedure Evaluation (Signed)
 Anesthesia Post Note  Patient: Jacob Velasquez  Procedure(s) Performed: CARDIOVERSION  Patient location during evaluation: [facility] Anesthesia Type: General Level of consciousness: awake and alert Pain management: pain level controlled Vital Signs Assessment: post-procedure vital signs reviewed and stable Respiratory status: spontaneous breathing, nonlabored ventilation, respiratory function stable and patient connected to nasal cannula oxygen Cardiovascular status: blood pressure returned to baseline and stable Postop Assessment: no apparent nausea or vomiting Anesthetic complications: no   No notable events documented.   Last Vitals:  Vitals:   04/19/23 1207 04/19/23 1215  BP:  112/76  Pulse: (!) 53 (!) 51  Resp: (!) 22 13  SpO2: 98% 96%    Last Pain:  Vitals:   04/19/23 1215  PainSc: 0-No pain                 Cleda Mccreedy Shain Pauwels

## 2023-04-20 ENCOUNTER — Encounter: Payer: Self-pay | Admitting: Internal Medicine

## 2023-07-23 ENCOUNTER — Other Ambulatory Visit: Payer: Self-pay

## 2023-07-23 ENCOUNTER — Observation Stay
Admission: EM | Admit: 2023-07-23 | Discharge: 2023-07-24 | Disposition: A | Discharge status: Home / Self Care | Attending: Student in an Organized Health Care Education/Training Program | Admitting: Student in an Organized Health Care Education/Training Program

## 2023-07-23 ENCOUNTER — Emergency Department

## 2023-07-23 DIAGNOSIS — N179 Acute kidney failure, unspecified: Secondary | ICD-10-CM | POA: Insufficient documentation

## 2023-07-23 DIAGNOSIS — Z8673 Personal history of transient ischemic attack (TIA), and cerebral infarction without residual deficits: Secondary | ICD-10-CM | POA: Diagnosis not present

## 2023-07-23 DIAGNOSIS — J449 Chronic obstructive pulmonary disease, unspecified: Secondary | ICD-10-CM | POA: Insufficient documentation

## 2023-07-23 DIAGNOSIS — I10 Essential (primary) hypertension: Secondary | ICD-10-CM | POA: Diagnosis present

## 2023-07-23 DIAGNOSIS — Z79899 Other long term (current) drug therapy: Secondary | ICD-10-CM | POA: Insufficient documentation

## 2023-07-23 DIAGNOSIS — N1831 Chronic kidney disease, stage 3a: Secondary | ICD-10-CM | POA: Diagnosis not present

## 2023-07-23 DIAGNOSIS — I480 Paroxysmal atrial fibrillation: Secondary | ICD-10-CM | POA: Diagnosis not present

## 2023-07-23 DIAGNOSIS — Z87891 Personal history of nicotine dependence: Secondary | ICD-10-CM | POA: Diagnosis not present

## 2023-07-23 DIAGNOSIS — Z7901 Long term (current) use of anticoagulants: Secondary | ICD-10-CM | POA: Diagnosis not present

## 2023-07-23 DIAGNOSIS — I129 Hypertensive chronic kidney disease with stage 1 through stage 4 chronic kidney disease, or unspecified chronic kidney disease: Secondary | ICD-10-CM | POA: Diagnosis not present

## 2023-07-23 DIAGNOSIS — I679 Cerebrovascular disease, unspecified: Secondary | ICD-10-CM | POA: Diagnosis present

## 2023-07-23 DIAGNOSIS — R531 Weakness: Secondary | ICD-10-CM | POA: Diagnosis not present

## 2023-07-23 DIAGNOSIS — R509 Fever, unspecified: Principal | ICD-10-CM | POA: Insufficient documentation

## 2023-07-23 DIAGNOSIS — N183 Chronic kidney disease, stage 3 unspecified: Secondary | ICD-10-CM | POA: Insufficient documentation

## 2023-07-23 LAB — CBC
HCT: 40.8 % (ref 39.0–52.0)
Hemoglobin: 13.4 g/dL (ref 13.0–17.0)
MCH: 29.6 pg (ref 26.0–34.0)
MCHC: 32.8 g/dL (ref 30.0–36.0)
MCV: 90.3 fL (ref 80.0–100.0)
Platelets: 251 10*3/uL (ref 150–400)
RBC: 4.52 MIL/uL (ref 4.22–5.81)
RDW: 14.6 % (ref 11.5–15.5)
WBC: 9.1 10*3/uL (ref 4.0–10.5)
nRBC: 0 % (ref 0.0–0.2)

## 2023-07-23 LAB — URINALYSIS, W/ REFLEX TO CULTURE (INFECTION SUSPECTED)
Bacteria, UA: NONE SEEN
Bilirubin Urine: NEGATIVE
Glucose, UA: NEGATIVE mg/dL
Hgb urine dipstick: NEGATIVE
Ketones, ur: 5 mg/dL — AB
Leukocytes,Ua: NEGATIVE
Nitrite: NEGATIVE
Protein, ur: NEGATIVE mg/dL
Specific Gravity, Urine: 1.027 (ref 1.005–1.030)
Squamous Epithelial / HPF: 0 /HPF (ref 0–5)
pH: 5 (ref 5.0–8.0)

## 2023-07-23 LAB — RESP PANEL BY RT-PCR (RSV, FLU A&B, COVID)  RVPGX2
Influenza A by PCR: NEGATIVE
Influenza B by PCR: NEGATIVE
Resp Syncytial Virus by PCR: NEGATIVE
SARS Coronavirus 2 by RT PCR: NEGATIVE

## 2023-07-23 LAB — COMPREHENSIVE METABOLIC PANEL WITH GFR
ALT: 12 U/L (ref 0–44)
AST: 19 U/L (ref 15–41)
Albumin: 3.9 g/dL (ref 3.5–5.0)
Alkaline Phosphatase: 40 U/L (ref 38–126)
Anion gap: 11 (ref 5–15)
BUN: 33 mg/dL — ABNORMAL HIGH (ref 8–23)
CO2: 24 mmol/L (ref 22–32)
Calcium: 8.9 mg/dL (ref 8.9–10.3)
Chloride: 99 mmol/L (ref 98–111)
Creatinine, Ser: 2.01 mg/dL — ABNORMAL HIGH (ref 0.61–1.24)
GFR, Estimated: 33 mL/min — ABNORMAL LOW (ref >60)
Glucose, Bld: 142 mg/dL — ABNORMAL HIGH (ref 70–99)
Potassium: 3.6 mmol/L (ref 3.5–5.1)
Sodium: 134 mmol/L — ABNORMAL LOW (ref 135–145)
Total Bilirubin: 1.4 mg/dL — ABNORMAL HIGH (ref 0.0–1.2)
Total Protein: 7 g/dL (ref 6.5–8.1)

## 2023-07-23 LAB — PROTIME-INR
INR: 1.4 — ABNORMAL HIGH (ref 0.8–1.2)
Prothrombin Time: 17 s — ABNORMAL HIGH (ref 11.4–15.2)

## 2023-07-23 LAB — LACTIC ACID, PLASMA: Lactic Acid, Venous: 1.1 mmol/L (ref 0.5–1.9)

## 2023-07-23 MED ORDER — SODIUM CHLORIDE 0.9 % IV SOLN: INTRAVENOUS | Status: DC

## 2023-07-23 MED ORDER — HYDROCHLOROTHIAZIDE 12.5 MG PO TABS
12.5000 mg | ORAL_TABLET | Freq: Every day | ORAL | Status: DC
  Administered 2023-07-24: 12.5 mg via ORAL
  Filled 2023-07-23: qty 1

## 2023-07-23 MED ORDER — ACETAMINOPHEN 650 MG RE SUPP: 650.0000 mg | Freq: Four times a day (QID) | RECTAL | Status: DC | PRN

## 2023-07-23 MED ORDER — VANCOMYCIN HCL IN DEXTROSE 1-5 GM/200ML-% IV SOLN: 1000.0000 mg | Freq: Once | INTRAVENOUS | Status: DC

## 2023-07-23 MED ORDER — SODIUM CHLORIDE 0.9 % IV SOLN
2.0000 g | INTRAVENOUS | Status: DC
  Administered 2023-07-24: 2 g via INTRAVENOUS
  Filled 2023-07-23 (×3): qty 20

## 2023-07-23 MED ORDER — SODIUM CHLORIDE 0.9 % IV SOLN
2.0000 g | Freq: Once | INTRAVENOUS | Status: AC
  Administered 2023-07-23: 2 g via INTRAVENOUS
  Filled 2023-07-23: qty 12.5

## 2023-07-23 MED ORDER — ONDANSETRON HCL 4 MG/2ML IJ SOLN: 4.0000 mg | Freq: Four times a day (QID) | INTRAMUSCULAR | Status: DC | PRN

## 2023-07-23 MED ORDER — SODIUM CHLORIDE 0.9 % IV BOLUS (SEPSIS)
1000.0000 mL | Freq: Once | INTRAVENOUS | Status: AC
  Administered 2023-07-23: 1000 mL via INTRAVENOUS

## 2023-07-23 MED ORDER — APIXABAN 5 MG PO TABS
5.0000 mg | ORAL_TABLET | Freq: Two times a day (BID) | ORAL | Status: DC
  Administered 2023-07-24 (×2): 5 mg via ORAL
  Filled 2023-07-23 (×2): qty 1

## 2023-07-23 MED ORDER — ATORVASTATIN CALCIUM 20 MG PO TABS
40.0000 mg | ORAL_TABLET | Freq: Every day | ORAL | Status: DC
  Administered 2023-07-24: 40 mg via ORAL
  Filled 2023-07-23: qty 2

## 2023-07-23 MED ORDER — AMIODARONE HCL 200 MG PO TABS: 200.0000 mg | ORAL_TABLET | Freq: Every day | ORAL | Status: DC

## 2023-07-23 MED ORDER — IRBESARTAN 150 MG PO TABS
75.0000 mg | ORAL_TABLET | Freq: Every day | ORAL | Status: DC
  Administered 2023-07-24: 75 mg via ORAL
  Filled 2023-07-23: qty 1

## 2023-07-23 MED ORDER — OXYCODONE HCL 5 MG PO TABS: 5.0000 mg | ORAL_TABLET | ORAL | Status: DC | PRN

## 2023-07-23 MED ORDER — SODIUM CHLORIDE 0.9 % IV BOLUS
1000.0000 mL | Freq: Once | INTRAVENOUS | Status: AC
  Administered 2023-07-23: 1000 mL via INTRAVENOUS

## 2023-07-23 MED ORDER — SODIUM CHLORIDE 0.9 % IV SOLN
100.0000 mg | Freq: Two times a day (BID) | INTRAVENOUS | Status: DC
  Administered 2023-07-24 (×2): 100 mg via INTRAVENOUS
  Filled 2023-07-23 (×4): qty 100

## 2023-07-23 MED ORDER — ACETAMINOPHEN 325 MG PO TABS: 650.0000 mg | ORAL_TABLET | Freq: Four times a day (QID) | ORAL | Status: DC | PRN

## 2023-07-23 MED ORDER — ONDANSETRON HCL 4 MG PO TABS: 4.0000 mg | ORAL_TABLET | Freq: Four times a day (QID) | ORAL | Status: DC | PRN

## 2023-07-23 MED ORDER — VANCOMYCIN HCL IN DEXTROSE 1-5 GM/200ML-% IV SOLN
1000.0000 mg | Freq: Once | INTRAVENOUS | Status: AC
  Administered 2023-07-23: 1000 mg via INTRAVENOUS
  Filled 2023-07-23: qty 200

## 2023-07-23 MED ORDER — METRONIDAZOLE 500 MG/100ML IV SOLN
500.0000 mg | Freq: Once | INTRAVENOUS | Status: AC
  Administered 2023-07-23: 500 mg via INTRAVENOUS
  Filled 2023-07-23: qty 100

## 2023-07-23 NOTE — Sepsis Progress Note (Signed)
 Sepsis protocol is being followed by [facility].

## 2023-07-23 NOTE — ED Notes (Signed)
 Pt refused in and out cath. Pt was bladder scanned and it showed . Pt was able to void .

## 2023-07-23 NOTE — Assessment & Plan Note (Addendum)
 Cr elevated relative to baseline.  Unclear if this is new baseline CKD IIIb or if this is mild AKI - IV fluids and trend

## 2023-07-23 NOTE — ED Triage Notes (Signed)
 Pt family sts that pt has been running a fever. Son sts that pt has been having a fever since Thursday. Pt son gave pt tylenol  at 1600. Pt sts that he had a tick bite yrs ago and the same thing happened.

## 2023-07-23 NOTE — Assessment & Plan Note (Addendum)
 Continue apixaban, amiodarone.

## 2023-07-23 NOTE — ED Provider Notes (Signed)
 [facility] Provider Note    Event Date/Time   First MD Initiated Contact with Patient 07/23/23 1651     (approximate)  History   Chief Complaint: Fever  HPI  Jacob Velasquez is a 78 y.o. male with a past medical history of atrial fibrillation on Eliquis , COPD, hypertension, TIA, presents to the emergency department for generalized fatigue weakness and fever.  According to the patient approximately 1 week ago he had some dental work performed, states no symptoms in the mouth no pain or swelling.  He went to the beach and upon his return on Thursday had been feeling very weak and had a fever per son.  Since then patient has had intermittent fever seem to be going away patient has been receiving Tylenol , however this morning patient's temperature was back up to 102.9 per son, gave Tylenol  and brought the patient to the emergency department this afternoon as he continued to feel very weak.  Here patient has a low-grade temperature 99.8 otherwise reassuring vital signs.  Patient denies any cough congestion nausea vomiting diarrhea abdominal pain or chest pain, no urinary symptoms.  Patient does state decreased appetite.  Physical Exam   Triage Vital Signs: ED Triage Vitals  Encounter Vitals Group     BP 07/23/23 1645 107/64     Girls Systolic BP Percentile --      Girls Diastolic BP Percentile --      Boys Systolic BP Percentile --      Boys Diastolic BP Percentile --      Pulse Rate 07/23/23 1645 79     Resp 07/23/23 1645 17     Temp 07/23/23 1645 99.8 F (37.7 C)     Temp Source 07/23/23 1645 Oral     SpO2 07/23/23 1645 96 %     Weight 07/23/23 1646 215 lb (97.5 kg)     Height 07/23/23 1646 6' 1 (1.854 m)     Head Circumference --      Peak Flow --      Pain Score 07/23/23 1646 0     Pain Loc --      Pain Education --      Exclude from Growth Chart --     Most recent vital signs: Vitals:   07/23/23 1645  BP: 107/64  Pulse: 79  Resp: 17  Temp:  99.8 F (37.7 C)  SpO2: 96%    General: Awake, no distress.  CV:  Good peripheral perfusion.  Regular rate and rhythm  Resp:  Normal effort.  Equal breath sounds bilaterally.  Abd:  No distention.  Soft, nontender.  No rebound or guarding.  ED Results / Procedures / Treatments   RADIOLOGY  I have reviewed and interpreted chest x-ray images.  No consolidation seen on my evaluation. Radiology is read the x-ray is negative.   MEDICATIONS ORDERED IN ED: Medications  sodium chloride  0.9 % bolus 1,000 mL (has no administration in time range)  ceFEPIme (MAXIPIME) 2 g in sodium chloride  0.9 % 100 mL IVPB (has no administration in time range)  metroNIDAZOLE (FLAGYL) IVPB 500 mg (has no administration in time range)  vancomycin (VANCOCIN) IVPB 1000 mg/200 mL premix (has no administration in time range)     IMPRESSION / MDM / ASSESSMENT AND PLAN / ED COURSE  I reviewed the triage vital signs and the nursing notes.  Patient's presentation is most consistent with acute presentation with potential threat to life or bodily function.  Patient presents to  the emergency department for generalized weakness with a fever.  Given the patient's weakness with a fever 99.8 and 102 at home prior to Tylenol  we will check labs, cultures.  Patient has no specific symptoms however we will check broad spectrum of labs, urinalysis, respiratory panel, chest x-ray.  Differential would include urinary tract infection, pneumonia, bacteremia given recent dental operation.  Will IV hydrate and dose broad-spectrum antibiotics while awaiting results.  Patient's workup has resulted showing a reassuring CBC with a normal white blood cell count, overall reassuring chemistry besides renal insufficiency with a creatinine of 2.0.  Urinalysis does not appear to show any obvious infection.  Respiratory panel negative.  Will admit for further workup treatment IV hydration for his AKI.  FINAL CLINICAL IMPRESSION(S) / ED DIAGNOSES    Fever Weakness Acute kidney injury   Note:  This document was prepared using Dragon voice recognition software and may include unintentional dictation errors.   Dorothyann Drivers, MD 07/25/23 1520

## 2023-07-23 NOTE — Hospital Course (Signed)
 78 y.o. M with HTN, HLD, hx TIA, smoking, CKD IIIa baseline 1.4-1.7, pAF on Eliquis  and amiodarone who presented with fever for 1 week after having some dental work done.  Went to [place] a week or so ago.  When he returned, had some dental work.  Some time after that, started to have fever as high as 102F and vague malaise, fatigue.  No cough, respiratory symptoms.  No urinary symptoms.  No rash, no headache.  In the ER, CXR clear.  UA normal.  Given recent dental work, given empiric antibiotics in the ER.  No fever, VSS but patient very weak, Cr up to 2.0 so Hospitalists asked to observe overnight.

## 2023-07-23 NOTE — Assessment & Plan Note (Signed)
 Hypertension BP elevated - Continue irbesartan, hydrochlorothiazide  - Continue Lipitor, Eliquis 

## 2023-07-23 NOTE — Assessment & Plan Note (Signed)
 Unclear cause.  He appears very uncomfortable, with rigors, flushing.  Nothing to suggest URI or gastroenteritis as cause of ~1 week fevers at home to 102F.  No focal symptoms to suggest source of infection.  He has a history of tickborne disease, but no known recent tick bites. He did also have recent dental work.  CXR clear, UA normal.   - Start doxycycline as empiric treatment for RMSF - Start Rocephin given no risk factors for drug resistant infections - PT eval - Follow blood cultures - If no fever in next 24 hours and defervesces, could conceivably stop antibiotics and monitor

## 2023-07-23 NOTE — ED Notes (Signed)
 Admitting MD at bedside.

## 2023-07-23 NOTE — H&P (Signed)
 History and Physical    Patient: Jacob Velasquez FMW:[id number] DOB: 03/03/1945 DOA: 07/23/2023 DOS: the patient was seen and examined on 07/23/2023 PCP: Rudolpho Norleen BIRCH, MD  Patient coming from: Home  Chief Complaint:  Chief Complaint  Patient presents with   Fever       HPI:  78 y.o. M with HTN, HLD, hx TIA, smoking, CKD IIIa baseline 1.4-1.7, pAF on Eliquis  and amiodarone who presented with fever for 1 week after having some dental work done.  Went to [employer] a week or so ago.  When he returned, had some dental work.  Some time after that, started to have fever as high as 102F and vague malaise, fatigue.  No cough, respiratory symptoms.  No urinary symptoms.  No rash, no headache.  In the ER, CXR clear.  UA normal.  Given recent dental work, given empiric antibiotics in the ER.  No fever, VSS but patient very weak, Cr up to 2.0 so Hospitalists asked to observe overnight.      Review of Systems  Constitutional:  Positive for chills, fever and malaise/fatigue.  HENT:  Negative for congestion and sinus pain.   Respiratory:  Negative for cough, sputum production and shortness of breath.   Gastrointestinal:  Negative for abdominal pain, nausea and vomiting.  Genitourinary:  Negative for dysuria, frequency and urgency.  Musculoskeletal:  Negative for back pain, joint pain, myalgias and neck pain.  Skin:  Negative for rash.  Neurological:  Positive for weakness. Negative for focal weakness and headaches.  All other systems reviewed and are negative.    Past Medical History:  Diagnosis Date   Atrial fibrillation (HCC)    COPD (chronic obstructive pulmonary disease) (HCC)    Fusion of spine    Hypertension    TIA (transient ischemic attack)    Past Surgical History:  Procedure Laterality Date   CARDIOVERSION N/A 06/15/2021   Procedure: CARDIOVERSION;  Surgeon: Florencio Cara BIRCH, MD;  Location: [facility] ORS;  Service: Cardiovascular;  Laterality: N/A;   CARDIOVERSION  N/A 04/13/2023   Procedure: CARDIOVERSION;  Surgeon: Florencio Cara BIRCH, MD;  Location: [facility];  Service: Cardiovascular;  Laterality: N/A;   CARDIOVERSION N/A 04/19/2023   Procedure: CARDIOVERSION;  Surgeon: Florencio Cara BIRCH, MD;  Location: [facility] OR[facility];  Service: Cardiovascular;  Laterality: N/A;   SPINAL FUSION N/A 07/30/2020   Procedure: FUSION POSTERIOR SPINAL MULTILEVEL Posterior Thoracic Fusion T7-T11;  Surgeon: Clois Fret, MD;  Location: [facility] OR[facility];  Service: Neurosurgery;  Laterality: N/A;   Social History:  reports that he has quit smoking. His smoking use included cigarettes. He has never used smokeless tobacco. He reports that he does not drink alcohol and does not use drugs.  No Known Allergies  Family History  Problem Relation Age of Onset   Hypertension Mother     Prior to Admission medications   Medication Sig Start Date End Date Taking? Authorizing Provider  acetaminophen  (TYLENOL ) 325 MG tablet Take 2 tablets (650 mg total) by mouth every 4 (four) hours as needed for mild pain (or temp > 37.5 C (99.5 F)). 02/23/21   Lenon Marien CROME, MD  amiodarone (PACERONE) 200 MG tablet Take 200 mg by mouth daily. 03/07/23   [provider]  apixaban  (ELIQUIS ) 5 MG TABS tablet Take 1 tablet (5 mg total) by mouth 2 (two) times daily. 02/23/21 06/11/23  Lenon Marien CROME, MD  atorvastatin  (LIPITOR) 40 MG tablet Take 1 tablet (40 mg total) by mouth daily. 01/02/20 06/11/23  Maree,  Vipul, MD  celecoxib  (CELEBREX ) 100 MG capsule Take 100 mg by mouth daily. Patient not taking: Reported on 04/19/2023 03/24/21   [provider]  dextromethorphan -guaiFENesin  (MUCINEX  DM) 30-600 MG 12hr tablet Take 1 tablet by mouth 2 (two) times daily. Patient taking differently: Take 1 tablet by mouth 2 (two) times daily as needed (cough/congestion.). 02/23/21   Lenon Marien CROME, MD  fluticasone  (FLONASE ) 50 MCG/ACT nasal spray Place 2 sprays into both nostrils daily. Patient taking  differently: Place 2 sprays into both nostrils daily as needed for allergies. 02/23/21 06/11/23  Lenon Marien CROME, MD  hydrochlorothiazide  (HYDRODIURIL ) 12.5 MG tablet Take 12.5 mg by mouth daily. 07/16/20   [provider]  ipratropium-albuterol (DUONEB) 0.5-2.5 (3) MG/3ML SOLN Take 3 mLs by nebulization daily.    [provider]  irbesartan (AVAPRO) 75 MG tablet Take 75 mg by mouth daily.    [provider]    Physical Exam: Vitals:   07/23/23 1931 07/23/23 2000 07/23/23 2204 07/23/23 2338  BP: 133/73 126/86 132/71 (!) 161/77  Pulse: (!) 58 82 73 77  Resp: 18 16 15 18   Temp: 98.8 F (37.1 C)  98.4 F (36.9 C) 98.5 F (36.9 C)  TempSrc: Oral  Oral Oral  SpO2: 100% 100% 100% 97%  Weight:      Height:       Elderly adult male, lying in bed, flushed, has rigors, appears uncomfortable, but makes eye contact and responds to questions Mild conjunctivitis without discharge, eyelids normal, no nasal deformity, discharge, or epistaxis, oropharynx moist, no oral lesions, no swelling in the mouth Neck without swelling or mass Heart rate regular, no murmurs, no peripheral edema Respiratory rate normal, lungs clear without rales or wheezes Abdomen soft, no tenderness palpation, no guarding, no ascites, no scars Attentive to questions, appears uncomfortable, oriented to person, place, and time, generalized weakness, rigors noted, moves upper extremities with generalized weakness but symmetric strength    Data Reviewed: Basic metabolic panel shows mild hyponatremia, creatinine up from baseline 1.7-2.0 On liver function test, LFTs normal, total bilirubin 1.4 Lactic acid 1.1 CBC without leukocytosis or anemia INR 1.4 consistent with Eliquis  use COVID, flu, RSV testing negative by PCR Urinalysis without bacteria, white blood cells Chest x-ray, personally reviewed, shows no airspace disease or opacity    Assessment and Plan: * Fever Unclear cause.  He appears  very uncomfortable, with rigors, flushing.  Nothing to suggest URI or gastroenteritis as cause of ~1 week fevers at home to 102F.  No focal symptoms to suggest source of infection.  He has a history of tickborne disease, but no known recent tick bites. He did also have recent dental work.  CXR clear, UA normal.   - Start doxycycline as empiric treatment for RMSF - Start Rocephin given no risk factors for drug resistant infections - PT eval - Follow blood cultures - If no fever in next 24 hours and defervesces, could conceivably stop antibiotics and monitor    CKD (chronic kidney disease), stage IIIa (HCC) Cr elevated relative to baseline.  Unclear if this is new baseline CKD IIIb or if this is mild AKI - IV fluids and trend  AF (paroxysmal atrial fibrillation) (HCC) - Continue apixaban , amiodarone  Cerebrovascular disease Hypertension BP elevated - Continue irbesartan, hydrochlorothiazide  - Continue Lipitor, Eliquis          Advance Care Planning: Full code, confirmed with son  Consults: None  Family Communication: Son at the bedside  Severity of Illness: The appropriate patient  status for this patient is OBSERVATION. Observation status is judged to be reasonable and necessary in order to provide the required intensity of service to ensure the patient's safety. The patient's presenting symptoms, physical exam findings, and initial radiographic and laboratory data in the context of their medical condition is felt to place them at decreased risk for further clinical deterioration. Furthermore, it is anticipated that the patient will be medically stable for discharge from the hospital within 2 midnights of admission.   Author: Lonni SHAUNNA Dalton, MD 07/23/2023 11:53 PM  For on call review [email or web address].

## 2023-07-23 NOTE — ED Notes (Signed)
 Pt refusing in and out cath. This RN is going to bladder scan pt.

## 2023-07-23 NOTE — Consult Note (Signed)
 CODE SEPSIS - PHARMACY COMMUNICATION  **Broad-spectrum antimicrobials should be administered within one hour of sepsis diagnosis**  Time Code Sepsis call or page was received: 1702  Antibiotics ordered: Cefepime, Vancomycin, Metronidazole  Time of first antibiotic administration: 1731  Additional action taken by pharmacy: N/A  If necessary, name of provider/nurse contacted: N/A    Will M. Lenon, PharmD Clinical Pharmacist 07/23/2023 5:09 PM

## 2023-07-24 DIAGNOSIS — R509 Fever, unspecified: Secondary | ICD-10-CM | POA: Diagnosis not present

## 2023-07-24 LAB — COMPREHENSIVE METABOLIC PANEL WITH GFR
ALT: 10 U/L (ref 0–44)
AST: 16 U/L (ref 15–41)
Albumin: 3.2 g/dL — ABNORMAL LOW (ref 3.5–5.0)
Alkaline Phosphatase: 36 U/L — ABNORMAL LOW (ref 38–126)
Anion gap: 8 (ref 5–15)
BUN: 29 mg/dL — ABNORMAL HIGH (ref 8–23)
CO2: 24 mmol/L (ref 22–32)
Calcium: 8.3 mg/dL — ABNORMAL LOW (ref 8.9–10.3)
Chloride: 102 mmol/L (ref 98–111)
Creatinine, Ser: 1.61 mg/dL — ABNORMAL HIGH (ref 0.61–1.24)
GFR, Estimated: 44 mL/min — ABNORMAL LOW (ref >60)
Glucose, Bld: 98 mg/dL (ref 70–99)
Potassium: 3.6 mmol/L (ref 3.5–5.1)
Sodium: 134 mmol/L — ABNORMAL LOW (ref 135–145)
Total Bilirubin: 1.4 mg/dL — ABNORMAL HIGH (ref 0.0–1.2)
Total Protein: 5.9 g/dL — ABNORMAL LOW (ref 6.5–8.1)

## 2023-07-24 LAB — CBC
HCT: 34.8 % — ABNORMAL LOW (ref 39.0–52.0)
Hemoglobin: 11.3 g/dL — ABNORMAL LOW (ref 13.0–17.0)
MCH: 29.4 pg (ref 26.0–34.0)
MCHC: 32.5 g/dL (ref 30.0–36.0)
MCV: 90.6 fL (ref 80.0–100.0)
Platelets: 215 10*3/uL (ref 150–400)
RBC: 3.84 MIL/uL — ABNORMAL LOW (ref 4.22–5.81)
RDW: 14.6 % (ref 11.5–15.5)
WBC: 8.6 10*3/uL (ref 4.0–10.5)
nRBC: 0 % (ref 0.0–0.2)

## 2023-07-24 MED ORDER — FLUTICASONE PROPIONATE 50 MCG/ACT NA SUSP: 2.0000 | Freq: Every day | NASAL | Status: AC | PRN

## 2023-07-24 MED ORDER — AMIODARONE HCL 200 MG PO TABS
100.0000 mg | ORAL_TABLET | Freq: Every day | ORAL | Status: DC
  Administered 2023-07-24: 100 mg via ORAL
  Filled 2023-07-24: qty 1

## 2023-07-24 MED ORDER — AMIODARONE HCL 100 MG PO TABS: 100.0000 mg | ORAL_TABLET | Freq: Every day | ORAL | 0 refills | Status: AC

## 2023-07-24 MED ORDER — IPRATROPIUM-ALBUTEROL 0.5-2.5 (3) MG/3ML IN SOLN: 3.0000 mL | Freq: Every day | RESPIRATORY_TRACT | Status: DC

## 2023-07-24 MED ORDER — DM-GUAIFENESIN ER 30-600 MG PO TB12: 1.0000 | ORAL_TABLET | Freq: Two times a day (BID) | ORAL | Status: AC | PRN

## 2023-07-24 MED ORDER — AMOXICILLIN-POT CLAVULANATE 875-125 MG PO TABS: 1.0000 | ORAL_TABLET | Freq: Two times a day (BID) | ORAL | 0 refills | Status: AC

## 2023-07-24 MED ORDER — ENSURE PLUS HIGH PROTEIN PO LIQD
237.0000 mL | Freq: Two times a day (BID) | ORAL | Status: DC
  Administered 2023-07-24: 237 mL via ORAL

## 2023-07-24 NOTE — Progress Notes (Incomplete)
 PROGRESS NOTE  Jacob Velasquez    DOB: 22-Nov-1945, 78 y.o.  FMW:[id number]    Code Status: Full Code   DOA: 07/23/2023   LOS: 0   Brief hospital course  Jacob Velasquez is a 78 y.o. male with a PMH significant for ***.  They presented from *** to the ED on 07/23/2023 with *** x *** days. ***  In the ED, it was found that they had ***.  Significant findings included ***.  They were initially treated with ***.   Patient was admitted to medicine service for further workup and management of *** as outlined in detail below.  07/24/23 -***  Assessment & Plan  Principal Problem:   Fever Active Problems:   Cerebrovascular disease   Essential hypertension   AF (paroxysmal atrial fibrillation) (HCC)   CKD (chronic kidney disease), stage IIIa (HCC)  Fever Unclear cause.  He appears very uncomfortable, with rigors, flushing.  Nothing to suggest URI or gastroenteritis as cause of ~1 week fevers at home to 102F.   No focal symptoms to suggest source of infection.  He has a history of tickborne disease, but no known recent tick bites. He did also have recent dental work.   CXR clear, UA normal.   - Start doxycycline as empiric treatment for RMSF - Start Rocephin given no risk factors for drug resistant infections - PT eval - Follow blood cultures - If no fever in next 24 hours and defervesces, could conceivably stop antibiotics and monitor   CKD (chronic kidney disease), stage IIIa (HCC) Cr elevated relative to baseline.  Unclear if this is new baseline CKD IIIb or if this is mild AKI - IV fluids and trend   AF (paroxysmal atrial fibrillation) (HCC) - Continue apixaban , amiodarone   Cerebrovascular disease Hypertension BP elevated - Continue irbesartan, hydrochlorothiazide  - Continue Lipitor, Eliquis   Body mass index is 28.37 kg/m.  VTE ppx:  apixaban  (ELIQUIS ) tablet 5 mg   Diet:     Diet   Diet regular Room service appropriate? Yes; Fluid consistency: Thin    Consultants: ***  Subjective 07/24/23    Pt reports ***   Objective  Blood pressure (!) 140/73, pulse 63, temperature 98.9 F (37.2 C), temperature source Oral, resp. rate 15, height 6' 1 (1.854 m), weight 97.5 kg, SpO2 97%.  Intake/Output Summary (Last 24 hours) at 07/24/2023 0720 Last data filed at 07/24/2023 0544 Gross per 24 hour  Intake 713.44 ml  Output --  Net 713.44 ml   Filed Weights   07/23/23 1646  Weight: 97.5 kg     Physical Exam: *** General: awake, alert, NAD HEENT: atraumatic, clear conjunctiva, anicteric sclera, MMM, hearing grossly normal Respiratory: normal respiratory effort. Cardiovascular: quick capillary refill, normal S1/S2, RRR, no JVD, murmurs Gastrointestinal: soft, NT, ND Nervous: A&O x3. no gross focal neurologic deficits, normal speech Extremities: moves all equally, no edema, normal tone Skin: dry, intact, normal temperature, normal color. No rashes, lesions or ulcers on exposed skin Psychiatry: normal mood, congruent affect  Labs   I have personally reviewed the following labs and imaging studies CBC    Component Value Date/Time   WBC 8.6 07/24/2023 0401   RBC 3.84 (L) 07/24/2023 0401   HGB 11.3 (L) 07/24/2023 0401   HGB 13.7 02/13/2012 1627   HCT 34.8 (L) 07/24/2023 0401   HCT 40.9 02/13/2012 1627   PLT 215 07/24/2023 0401   PLT 278 02/13/2012 1627   MCV 90.6 07/24/2023 0401   MCV 92 02/13/2012  1627   MCH 29.4 07/24/2023 0401   MCHC 32.5 07/24/2023 0401   RDW 14.6 07/24/2023 0401   RDW 13.1 02/13/2012 1627   LYMPHSABS 1.7 02/21/2021 1813   MONOABS 0.6 02/21/2021 1813   EOSABS 0.1 02/21/2021 1813   BASOSABS 0.1 02/21/2021 1813      Latest Ref Rng & Units 07/24/2023    4:01 AM 07/23/2023    4:47 PM 02/25/2021    7:43 AM  BMP  Glucose 70 - 99 mg/dL 98  857  98   BUN 8 - 23 mg/dL 29  33  25   Creatinine 0.61 - 1.24 mg/dL 8.38  7.98  8.86   Sodium 135 - 145 mmol/L 134  134  136   Potassium 3.5 - 5.1 mmol/L 3.6  3.6  4.0    Chloride 98 - 111 mmol/L 102  99  106   CO2 22 - 32 mmol/L 24  24  25    Calcium  8.9 - 10.3 mg/dL 8.3  8.9  8.3     DG Chest Port 1 View Result Date: 07/23/2023 CLINICAL DATA:  Questionable sepsis EXAM: PORTABLE CHEST 1 VIEW COMPARISON:  None Available. FINDINGS: Normal mediastinum and cardiac silhouette. Normal pulmonary vasculature. No evidence of effusion, infiltrate, or pneumothorax. No acute bony abnormality. Posterior thoracic multilevel fusion. IMPRESSION: No active disease. Electronically Signed   By: Jackquline Boxer M.D.   On: 07/23/2023 17:19    Disposition Plan & Communication  Patient status: Observation  Admitted From: {From:23814} Planned disposition location: {PLAN; DISPOSITION:26386} Anticipated discharge date: *** pending ***  Family Communication: ***    Author: Marien LITTIE Piety, DO [facility] 07/24/2023, 7:20 AM   Available by Epic secure chat 7AM-7PM. If 7PM-7AM, please contact night-coverage.  [facility] contact information found on [email or web address].

## 2023-07-24 NOTE — Progress Notes (Signed)
 PT Cancellation Note  Patient Details Name: Jacob Velasquez MRN: [id number] DOB: October 21, 1945   Cancelled Treatment:    Reason Eval/Treat Not Completed: PT screened, no needs identified, will sign off Orders received, chart reviewed. Patient reporting he is at his baseline and independent with no AD. Complains of chronic back pain but does not want PT to follow up in hospital or outside of hospital. No skilled PT needs warranted. PT will sign off.   Maryanne Finder, PT, DPT Physical Therapist - [facility]  [facility]    Tiegan Jambor A Emry Barbato 07/24/2023, 9:38 AM

## 2023-07-24 NOTE — Discharge Instructions (Addendum)
 I'm sorry that you have not been feeling well. You may have a viral illness or possibly bacterial from recent dental work.  I have continued an antibiotic for coverage as a precaution. You otherwise looks stable without fever or elevated infection labs. Blood cultures are negative so far. Another cause to contribute to you not feeling well may have been a lower heart rate- I have decreased your amiodarone to 100mg  daily with still good heart rate control on the lower side.  Please follow up with your PCP at earliest available appointment to discuss these changes.   Do you feel isolated?  [facility] offers a [employer] that anyone can call toll free at [phone]. The friendship line is available 24 hours a day  [employer] is a Program of All-inclusive Care for the Elderly (PACE). Their mission is to promote and sustain the independence of seniors wishing to remain in the community. They provide seniors with comprehensive long-term health, social, medical and dietary care. Their program is a safe alternative to nursing home care. [phone]  [facility] Physical Address [facility] [address] [place], [place] [address] Phone: [phone]. . Online zoom yoga class, connect with others without leaving your home [facility] offers [place] dance cardio sessions for individuals via Zoom. This program provides: - Dance fitness activities Please contact program for more information. Servinganyone in need adults 18+ hiv/aids individuals families Call [phone]  Email [email or web address] [email or web address] to get more info  [employer] offers an online [employer] to individuals where they can receive help to focus on their best health. Whether you're a [employer] member or not, the neighborhood center offers a... Main Serviceshealth education  exercise & fitness  community support services  recreation  virtual support Other Servicessupport  groups Servinganyone in need adults young adults teens seniors individuals families [email or web address] to get more info  Schedule on their website  [facility] offers an array of activities for adults age 19 and over. This program provides:- Fitness and health programs- Tech classes- Activity books Main Serviceshealth education  community support services  exercise & fitness  recreation  more education Servingseniors  Call [phone]    For more resources go online to [email or web address].[email or web address].uk and type in you zipcode

## 2023-07-24 NOTE — TOC CM/SW Note (Signed)
 Transition of Care [facility]) - Inpatient Brief Assessment   Patient Details  Name: Jacob Velasquez MRN: [id number] Date of Birth: [birth year]  Transition of [facility]) CM/SW Contact:    Lauraine JAYSON Carpen, LCSW Phone Number: 07/24/2023, 1:04 PM   Clinical Narrative: Patient has orders to discharge home today. Chart reviewed. SDOH flag for social isolation. Added resources to AVS. No further concerns. CSW signing off.  Transition of Care Asessment: Insurance and Status: Insurance coverage has been reviewed Patient has primary care physician: Yes Home environment has been reviewed: Single family home Prior level of function:: Not documented Prior/Current Home Services: No current home services Social Drivers of Health Review: SDOH reviewed interventions complete Readmission risk has been reviewed: Yes Transition of care needs: no transition of care needs at this time

## 2023-07-24 NOTE — Discharge Summary (Signed)
 Physician Discharge Summary  Patient: Jacob Velasquez FMW:[id number] DOB: 1945/04/05   Code Status: Full Code Admit date: 07/23/2023 Discharge date: 07/24/2023 Disposition: Home, No home health services recommended PCP: Rudolpho Norleen BIRCH, MD  Recommendations for Outpatient Follow-up:  Follow up with PCP within 1-2 weeks Regarding general hospital follow up and preventative care Recommend BMP  Discharge Diagnoses:  Principal Problem:   Fever Active Problems:   Cerebrovascular disease   Essential hypertension   AF (paroxysmal atrial fibrillation) (HCC)   CKD (chronic kidney disease), stage IIIa [facility])  Brief Hospital Course Summary: 78 y.o. M with HTN, HLD, hx TIA, smoking, CKD IIIa baseline 1.4-1.7, pAF on Eliquis  and amiodarone who presented with fever for 1 week after having some dental work done. No cough, respiratory symptoms.  No urinary symptoms.  No rash, no headache.   In the ER: CXR clear.  UA normal.  Given recent dental work, given empiric antibiotics in the ER.  No fever, VSS but patient very weak, Cr up to 2.0. overall workup was unremarkable. Symptoms likely from viral illness. COVID, RSV, flu negative. Given the possibility of relation to recent dental work, empiric antibiotics were continued.  He was discharged in his baseline condition.   AKI improved. Cr 2.01>1.61  All other chronic conditions were treated with home medications.    Discharge Condition: Good, improved Recommended discharge diet: Regular healthy diet  Consultations: None   Procedures/Studies: none  Allergies as of 07/24/2023   No Known Allergies      Medication List     STOP taking these medications    celecoxib  100 MG capsule Commonly known as: CELEBREX        TAKE these medications    acetaminophen  325 MG tablet Commonly known as: TYLENOL  Take 2 tablets (650 mg total) by mouth every 4 (four) hours as needed for mild pain (or temp > 37.5 C (99.5 F)).   amiodarone 100 MG  tablet Commonly known as: PACERONE Take 1 tablet (100 mg total) by mouth daily. Start taking on: July 25, 2023 What changed:  medication strength how much to take   amoxicillin -clavulanate 875-125 MG tablet Commonly known as: AUGMENTIN  Take 1 tablet by mouth 2 (two) times daily for 5 days.   apixaban  5 MG Tabs tablet Commonly known as: ELIQUIS  Take 1 tablet (5 mg total) by mouth 2 (two) times daily.   atorvastatin  40 MG tablet Commonly known as: Lipitor Take 1 tablet (40 mg total) by mouth daily.   dextromethorphan -guaiFENesin  30-600 MG 12hr tablet Commonly known as: MUCINEX  DM Take 1 tablet by mouth 2 (two) times daily as needed (cough/congestion.).   fluticasone  50 MCG/ACT nasal spray Commonly known as: FLONASE  Place 2 sprays into both nostrils daily as needed for allergies.   hydrochlorothiazide  12.5 MG tablet Commonly known as: HYDRODIURIL  Take 12.5 mg by mouth daily.   ipratropium-albuterol 0.5-2.5 (3) MG/3ML Soln Commonly known as: DUONEB Take 3 mLs by nebulization daily.   irbesartan 75 MG tablet Commonly known as: AVAPRO Take 75 mg by mouth daily.        Follow-up Information     Rudolpho Norleen BIRCH, MD. Schedule an appointment as soon as possible for a visit in 1 week(s).   Specialty: Internal Medicine Contact information: [address] [facility] [place] [address] [phone]                 Subjective   Pt reports feeling somewhat improved but still unwell. He says that he has  no URVI symptoms or dental pain but he continues to have sniffles throughout the time of our encounter.   All questions and concerns were addressed at time of discharge.  Objective  Blood pressure (!) 116/42, pulse 61, temperature 99.1 F (37.3 C), resp. rate 19, height 6' 1 (1.854 m), weight 97.5 kg, SpO2 96%.   General: Pt is alert, awake, not in acute distress Head: [place]/AT.   Eyes:  EOMI, PERRL.   Ears:  External ears WNL, Bilateral TM's  normal without retraction, redness or bulging. Nose:  Septum midline, clear nasal drainage Mouth:  MMM, tonsils non-erythematous, non-edematous.   Cardiovascular: RRR, S1/S2 +, no rubs, no gallops Respiratory: CTA bilaterally, no wheezing, no rhonchi Abdominal: Soft, NT, ND, bowel sounds + Extremities: no edema, no cyanosis  The results of significant diagnostics from this hospitalization (including imaging, microbiology, ancillary and laboratory) are listed below for reference.   Imaging studies: DG Chest Port 1 View Result Date: 07/23/2023 CLINICAL DATA:  Questionable sepsis EXAM: PORTABLE CHEST 1 VIEW COMPARISON:  None Available. FINDINGS: Normal mediastinum and cardiac silhouette. Normal pulmonary vasculature. No evidence of effusion, infiltrate, or pneumothorax. No acute bony abnormality. Posterior thoracic multilevel fusion. IMPRESSION: No active disease. Electronically Signed   By: Jackquline Boxer M.D.   On: 07/23/2023 17:19    Labs: Basic Metabolic Panel: Recent Labs  Lab 07/23/23 1647 07/24/23 0401  NA 134* 134*  K 3.6 3.6  CL 99 102  CO2 24 24  GLUCOSE 142* 98  BUN 33* 29*  CREATININE 2.01* 1.61*  CALCIUM  8.9 8.3*   CBC: Recent Labs  Lab 07/23/23 1647 07/24/23 0401  WBC 9.1 8.6  HGB 13.4 11.3*  HCT 40.8 34.8*  MCV 90.3 90.6  PLT 251 215   Microbiology: Results for orders placed or performed during the hospital encounter of 07/23/23  Resp panel by RT-PCR (RSV, Flu A&B, Covid) Anterior Nasal Swab     Status: None   Collection Time: 07/23/23  5:16 PM   Specimen: Anterior Nasal Swab  Result Value Ref Range Status   SARS Coronavirus 2 by RT PCR NEGATIVE NEGATIVE Final    Comment: (NOTE) SARS-CoV-2 target nucleic acids are NOT DETECTED.  The SARS-CoV-2 RNA is generally detectable in upper respiratory specimens during the acute phase of infection. The lowest concentration of SARS-CoV-2 viral copies this assay can detect is 138 copies/mL. A negative result  does not preclude SARS-Cov-2 infection and should not be used as the sole basis for treatment or other patient management decisions. A negative result may occur with  improper specimen collection/handling, submission of specimen other than nasopharyngeal swab, presence of viral mutation(s) within the areas targeted by this assay, and inadequate number of viral copies(<138 copies/mL). A negative result must be combined with clinical observations, patient history, and epidemiological information. The expected result is Negative.  Fact Sheet for Patients:  BloggerCourse.com  Fact Sheet for Healthcare Providers:  SeriousBroker.it  This test is no t yet approved or cleared by the [place]  FDA and  has been authorized for detection and/or diagnosis of SARS-CoV-2 by FDA under an Emergency Use Authorization (EUA). This EUA will remain  in effect (meaning this test can be used) for the duration of the COVID-19 declaration under Section 564(b)(1) of the Act, 21 U.S.C.section 360bbb-3(b)(1), unless the authorization is terminated  or revoked sooner.       Influenza A by PCR NEGATIVE NEGATIVE Final   Influenza B by PCR NEGATIVE NEGATIVE Final    Comment: (NOTE)  The Xpert Xpress SARS-CoV-2/FLU/RSV plus assay is intended as an aid in the diagnosis of influenza from Nasopharyngeal swab specimens and should not be used as a sole basis for treatment. Nasal washings and aspirates are unacceptable for Xpert Xpress SARS-CoV-2/FLU/RSV testing.  Fact Sheet for Patients: BloggerCourse.com  Fact Sheet for Healthcare Providers: SeriousBroker.it  This test is not yet approved or cleared by the [place]  FDA and has been authorized for detection and/or diagnosis of SARS-CoV-2 by FDA under an Emergency Use Authorization (EUA). This EUA will remain in effect (meaning this test can be used) for  the duration of the COVID-19 declaration under Section 564(b)(1) of the Act, 21 U.S.C. section 360bbb-3(b)(1), unless the authorization is terminated or revoked.     Resp Syncytial Virus by PCR NEGATIVE NEGATIVE Final    Comment: (NOTE) Fact Sheet for Patients: BloggerCourse.com  Fact Sheet for Healthcare Providers: SeriousBroker.it  This test is not yet approved or cleared by the [place]  FDA and has been authorized for detection and/or diagnosis of SARS-CoV-2 by FDA under an Emergency Use Authorization (EUA). This EUA will remain in effect (meaning this test can be used) for the duration of the COVID-19 declaration under Section 564(b)(1) of the Act, 21 U.S.C. section 360bbb-3(b)(1), unless the authorization is terminated or revoked.  Performed at [facility], [address] [place], [place] [address]   Blood Culture (routine x 2)     Status: None (Preliminary result)   Collection Time: 07/23/23  5:16 PM   Specimen: BLOOD  Result Value Ref Range Status   Specimen Description BLOOD BLOOD RIGHT ARM  Final   Special Requests   Final    BOTTLES DRAWN AEROBIC AND ANAEROBIC Blood Culture adequate volume   Culture   Final    NO GROWTH < 24 HOURS Performed at [facility], [address] [place], [place] [address]    Report Status PENDING  Incomplete  Blood Culture (routine x 2)     Status: None (Preliminary result)   Collection Time: 07/23/23  5:17 PM   Specimen: BLOOD  Result Value Ref Range Status   Specimen Description BLOOD BLOOD LEFT ARM  Final   Special Requests   Final    BOTTLES DRAWN AEROBIC AND ANAEROBIC Blood Culture adequate volume   Culture   Final    NO GROWTH < 24 HOURS Performed at [facility], [address], [place], [place] [address]    Report Status PENDING  Incomplete    Time coordinating discharge: Over 30 minutes  Marien LITTIE Piety, MD  [facility] 07/24/2023, 12:20 PM

## 2023-07-24 NOTE — Plan of Care (Signed)

## 2023-07-28 LAB — CULTURE, BLOOD (ROUTINE X 2)
Culture: NO GROWTH
Culture: NO GROWTH
Special Requests: ADEQUATE
Special Requests: ADEQUATE

## 2023-08-16 ENCOUNTER — Emergency Department

## 2023-08-16 ENCOUNTER — Emergency Department
Admission: EM | Admit: 2023-08-16 | Discharge: 2023-08-16 | Discharge status: Against Medical Advice | Attending: Emergency Medicine | Admitting: Emergency Medicine

## 2023-08-16 ENCOUNTER — Other Ambulatory Visit: Payer: Self-pay

## 2023-08-16 DIAGNOSIS — R5383 Other fatigue: Secondary | ICD-10-CM | POA: Diagnosis not present

## 2023-08-16 DIAGNOSIS — Z5321 Procedure and treatment not carried out due to patient leaving prior to being seen by health care provider: Secondary | ICD-10-CM | POA: Diagnosis not present

## 2023-08-16 DIAGNOSIS — R509 Fever, unspecified: Secondary | ICD-10-CM | POA: Insufficient documentation

## 2023-08-16 DIAGNOSIS — M791 Myalgia, unspecified site: Secondary | ICD-10-CM | POA: Insufficient documentation

## 2023-08-16 LAB — CBC WITH DIFFERENTIAL/PLATELET
Abs Immature Granulocytes: 0.1 K/uL — ABNORMAL HIGH (ref 0.00–0.07)
Basophils Absolute: 0 K/uL (ref 0.0–0.1)
Basophils Relative: 0 %
Eosinophils Absolute: 0.1 K/uL (ref 0.0–0.5)
Eosinophils Relative: 1 %
HCT: 38.1 % — ABNORMAL LOW (ref 39.0–52.0)
Hemoglobin: 12.7 g/dL — ABNORMAL LOW (ref 13.0–17.0)
Immature Granulocytes: 1 %
Lymphocytes Relative: 6 %
Lymphs Abs: 1 K/uL (ref 0.7–4.0)
MCH: 30.5 pg (ref 26.0–34.0)
MCHC: 33.3 g/dL (ref 30.0–36.0)
MCV: 91.4 fL (ref 80.0–100.0)
Monocytes Absolute: 1.2 K/uL — ABNORMAL HIGH (ref 0.1–1.0)
Monocytes Relative: 8 %
Neutro Abs: 13.7 K/uL — ABNORMAL HIGH (ref 1.7–7.7)
Neutrophils Relative %: 84 %
Platelets: 205 K/uL (ref 150–400)
RBC: 4.17 MIL/uL — ABNORMAL LOW (ref 4.22–5.81)
RDW: 15.1 % (ref 11.5–15.5)
WBC: 16.2 K/uL — ABNORMAL HIGH (ref 4.0–10.5)
nRBC: 0 % (ref 0.0–0.2)

## 2023-08-16 LAB — BASIC METABOLIC PANEL WITH GFR
Anion gap: 12 (ref 5–15)
BUN: 24 mg/dL — ABNORMAL HIGH (ref 8–23)
CO2: 25 mmol/L (ref 22–32)
Calcium: 8.9 mg/dL (ref 8.9–10.3)
Chloride: 101 mmol/L (ref 98–111)
Creatinine, Ser: 1.43 mg/dL — ABNORMAL HIGH (ref 0.61–1.24)
GFR, Estimated: 50 mL/min — ABNORMAL LOW (ref >60)
Glucose, Bld: 137 mg/dL — ABNORMAL HIGH (ref 70–99)
Potassium: 3.8 mmol/L (ref 3.5–5.1)
Sodium: 138 mmol/L (ref 135–145)

## 2023-08-16 LAB — RESP PANEL BY RT-PCR (RSV, FLU A&B, COVID)  RVPGX2
Influenza A by PCR: NEGATIVE
Influenza B by PCR: NEGATIVE
Resp Syncytial Virus by PCR: NEGATIVE
SARS Coronavirus 2 by RT PCR: NEGATIVE

## 2023-08-16 LAB — LACTIC ACID, PLASMA: Lactic Acid, Venous: 1.1 mmol/L (ref 0.5–1.9)

## 2023-08-16 NOTE — ED Triage Notes (Addendum)
 Pt reports he was seen 3 weeks ago for high fever and weakness pt was sent home with abx, pt reports today developed fever. Pt had tylenol  at 2030. Reports he has had inc fatigue and has generalized body aches.

## 2024-02-13 ENCOUNTER — Other Ambulatory Visit: Payer: Self-pay

## 2024-02-13 ENCOUNTER — Encounter: Payer: Self-pay | Admitting: Ophthalmology

## 2024-02-20 ENCOUNTER — Ambulatory Visit: Payer: Self-pay | Admitting: Anesthesiology

## 2024-02-20 ENCOUNTER — Encounter: Payer: Self-pay | Admitting: Ophthalmology

## 2024-02-20 ENCOUNTER — Encounter
Admission: RE | Disposition: A | Discharge status: Home / Self Care | Payer: Self-pay | Source: Home / Self Care | Attending: Ophthalmology

## 2024-02-20 ENCOUNTER — Ambulatory Visit
Admission: RE | Admit: 2024-02-20 | Discharge: 2024-02-20 | Disposition: A | Discharge status: Home / Self Care | Attending: Ophthalmology | Admitting: Ophthalmology

## 2024-02-20 ENCOUNTER — Other Ambulatory Visit: Payer: Self-pay

## 2024-02-20 DIAGNOSIS — H2511 Age-related nuclear cataract, right eye: Secondary | ICD-10-CM | POA: Diagnosis present

## 2024-02-20 DIAGNOSIS — I10 Essential (primary) hypertension: Secondary | ICD-10-CM | POA: Insufficient documentation

## 2024-02-20 DIAGNOSIS — Z87891 Personal history of nicotine dependence: Secondary | ICD-10-CM | POA: Insufficient documentation

## 2024-02-20 HISTORY — PX: CATARACT EXTRACTION W/PHACO: SHX586

## 2024-02-20 MED ORDER — CYCLOPENTOLATE HCL 2 % OP SOLN
1.0000 [drp] | OPHTHALMIC | Status: DC | PRN
  Administered 2024-02-20 (×2): 1 [drp] via OPHTHALMIC

## 2024-02-20 MED ORDER — SIGHTPATH DOSE#1 BSS IO SOLN
INTRAOCULAR | Status: DC | PRN
  Administered 2024-02-20: 15 mL via INTRAOCULAR

## 2024-02-20 MED ORDER — PHENYLEPHRINE HCL 10 % OP SOLN
1.0000 [drp] | OPHTHALMIC | Status: AC | PRN
  Administered 2024-02-20 (×3): 1 [drp] via OPHTHALMIC

## 2024-02-20 MED ORDER — BRIMONIDINE TARTRATE-TIMOLOL 0.2-0.5 % OP SOLN
OPHTHALMIC | Status: DC | PRN
  Administered 2024-02-20: 1 [drp] via OPHTHALMIC

## 2024-02-20 MED ORDER — TETRACAINE HCL 0.5 % OP SOLN
OPHTHALMIC | Status: AC
  Filled 2024-02-20: qty 4

## 2024-02-20 MED ORDER — SIGHTPATH DOSE#1 BSS IO SOLN
INTRAOCULAR | Status: DC | PRN
  Administered 2024-02-20: 71 mL via OPHTHALMIC

## 2024-02-20 MED ORDER — LIDOCAINE HCL (PF) 2 % IJ SOLN
INTRAOCULAR | Status: DC | PRN
  Administered 2024-02-20: 2 mL

## 2024-02-20 MED ORDER — TETRACAINE HCL 0.5 % OP SOLN
1.0000 [drp] | OPHTHALMIC | Status: DC | PRN
  Administered 2024-02-20 (×3): 1 [drp] via OPHTHALMIC

## 2024-02-20 MED ORDER — CYCLOPENTOLATE HCL 2 % OP SOLN
OPHTHALMIC | Status: AC
  Filled 2024-02-20: qty 2

## 2024-02-20 MED ORDER — SIGHTPATH DOSE#1 NA CHONDROIT SULF-NA HYALURON 40-17 MG/ML IO SOLN
INTRAOCULAR | Status: DC | PRN
  Administered 2024-02-20: 1 mL via INTRAOCULAR

## 2024-02-20 MED ORDER — LACTATED RINGERS IV SOLN: INTRAVENOUS | Status: DC

## 2024-02-20 MED ORDER — PHENYLEPHRINE HCL 10 % OP SOLN
OPHTHALMIC | Status: AC
  Filled 2024-02-20: qty 5

## 2024-02-20 MED ORDER — MIDAZOLAM HCL (PF) 2 MG/2ML IJ SOLN
INTRAMUSCULAR | Status: DC | PRN
  Administered 2024-02-20 (×2): 1 mg via INTRAVENOUS

## 2024-02-20 MED ORDER — MOXIFLOXACIN HCL 0.5 % OP SOLN
OPHTHALMIC | Status: DC | PRN
  Administered 2024-02-20: .2 mL via OPHTHALMIC

## 2024-02-20 MED ORDER — FENTANYL CITRATE (PF) 100 MCG/2ML IJ SOLN
INTRAMUSCULAR | Status: DC | PRN
  Administered 2024-02-20 (×2): 50 ug via INTRAVENOUS

## 2024-02-20 MED ORDER — FENTANYL CITRATE (PF) 100 MCG/2ML IJ SOLN
INTRAMUSCULAR | Status: AC
  Filled 2024-02-20: qty 2

## 2024-02-20 MED ORDER — MIDAZOLAM HCL 2 MG/2ML IJ SOLN
INTRAMUSCULAR | Status: AC
  Filled 2024-02-20: qty 2

## 2024-02-20 NOTE — H&P (Signed)
 Jacob Velasquez   Primary Care Physician:  Rudolpho Norleen BIRCH, MD Ophthalmologist: Dr. Elsie Carmine  Pre-Procedure History & Physical: HPI:  Jacob Velasquez is a 79 y.o. male here for cataract surgery.   Past Medical History:  Diagnosis Date   Atrial fibrillation (HCC)    COPD (chronic obstructive pulmonary disease) (HCC)    Fusion of spine    Hypertension    TIA (transient ischemic attack)     Past Surgical History:  Procedure Laterality Date   CARDIOVERSION N/A 06/15/2021   Procedure: CARDIOVERSION;  Surgeon: Florencio Cara BIRCH, MD;  Location: [facility];  Service: Cardiovascular;  Laterality: N/A;   CARDIOVERSION N/A 04/13/2023   Procedure: CARDIOVERSION;  Surgeon: Florencio Cara BIRCH, MD;  Location: [facility];  Service: Cardiovascular;  Laterality: N/A;   CARDIOVERSION N/A 04/19/2023   Procedure: CARDIOVERSION;  Surgeon: Florencio Cara BIRCH, MD;  Location: [facility];  Service: Cardiovascular;  Laterality: N/A;   SPINAL FUSION N/A 07/30/2020   Procedure: FUSION POSTERIOR SPINAL MULTILEVEL Posterior Thoracic Fusion T7-T11;  Surgeon: Clois Fret, MD;  Location: [facility];  Service: Neurosurgery;  Laterality: N/A;    Prior to Admission medications  Medication Sig Start Date End Date Taking? Authorizing Provider  acetaminophen  (TYLENOL ) 325 MG tablet Take 2 tablets (650 mg total) by mouth every 4 (four) hours as needed for mild pain (or temp > 37.5 C (99.5 F)). 02/23/21  Yes Lenon Marien CROME, MD  dextromethorphan -guaiFENesin  (MUCINEX  DM) 30-600 MG 12hr tablet Take 1 tablet by mouth 2 (two) times daily as needed (cough/congestion.). 07/24/23  Yes Lenon Marien CROME, MD  hydrochlorothiazide  (HYDRODIURIL ) 12.5 MG tablet Take 12.5 mg by mouth daily. 07/16/20  Yes [provider]  ipratropium-albuterol  (DUONEB) 0.5-2.5 (3) MG/3ML SOLN Take 3 mLs by nebulization daily.   Yes [provider]  irbesartan  (AVAPRO ) 75 MG tablet Take 75 mg by mouth daily.   Yes  [provider]  amiodarone  (PACERONE ) 100 MG tablet Take 1 tablet (100 mg total) by mouth daily. 07/25/23 08/24/23  Lenon Marien CROME, MD  apixaban  (ELIQUIS ) 5 MG TABS tablet Take 1 tablet (5 mg total) by mouth 2 (two) times daily. 02/23/21 06/11/23  Lenon Marien CROME, MD  atorvastatin  (LIPITOR) 40 MG tablet Take 1 tablet (40 mg total) by mouth daily. 01/02/20 06/11/23  Maree Hue, MD  fluticasone  (FLONASE ) 50 MCG/ACT nasal spray Place 2 sprays into both nostrils daily as needed for allergies. 07/24/23 09/22/23  Lenon Marien CROME, MD    Allergies as of 02/05/2024   (No Known Allergies)    Family History  Problem Relation Age of Onset   Hypertension Mother     Social History   Socioeconomic History   Marital status: Single    Spouse name: Not on file   Number of children: Not on file   Years of education: Not on file   Highest education level: Not on file  Occupational History   Not on file  Tobacco Use   Smoking status: Former    Types: Cigarettes   Smokeless tobacco: Never   Tobacco comments:    Quit smoking 4 years ago  Substance and Sexual Activity   Alcohol use: Never   Drug use: Never   Sexual activity: Not on file  Other Topics Concern   Not on file  Social History Narrative   Son Chyrl will take him home   Social Drivers of Health   Tobacco Use: Medium Risk (02/20/2024)   Patient History    Smoking  Tobacco Use: Former    Smokeless Tobacco Use: Never    Passive Exposure: Not on file  Financial Resource Strain: Low Risk  (03/21/2023)   Received from [facility]   Overall Financial Resource Strain (CARDIA)    Difficulty of Paying Living Expenses: Not hard at all  Food Insecurity: No Food Insecurity (07/23/2023)   Epic    Worried About Running Out of Food in the Last Year: Never true    Ran Out of Food in the Last Year: Never true  Transportation Needs: No Transportation Needs (07/23/2023)   Epic    Lack of Transportation (Medical):  No    Lack of Transportation (Non-Medical): No  Physical Activity: Not on file  Stress: Not on file  Social Connections: Socially Isolated (07/23/2023)   Social Connection and Isolation Panel    Frequency of Communication with Friends and Family: More than three times a week    Frequency of Social Gatherings with Friends and Family: Three times a week    Attends Religious Services: Never    Active Member of Clubs or Organizations: No    Attends Banker Meetings: Never    Marital Status: Divorced  Catering Manager Violence: Not At Risk (07/23/2023)   Epic    Fear of Current or Ex-Partner: No    Emotionally Abused: No    Physically Abused: No    Sexually Abused: No  Depression (PHQ2-9): Not on file  Alcohol Screen: Not on file  Housing: Low Risk  (01/05/2024)   Received from [facility]   Epic    In the last 12 months, was there a time when you were not able to pay the mortgage or rent on time?: No    In the past 12 months, how many times have you moved where you were living?: 0    At any time in the past 12 months, were you homeless or living in a shelter (including now)?: No  Utilities: Not At Risk (07/23/2023)   Epic    Threatened with loss of utilities: No  Health Literacy: Not on file    Review of Systems: See HPI, otherwise negative ROS  Physical Exam: BP (!) 166/76   Pulse (!) 46   Temp 97.8 F (36.6 C) (Temporal)   Resp 12   Ht 6' 1 (1.854 m)   Wt 98 kg   SpO2 98%   BMI 28.50 kg/m  General:   Alert, cooperative. Head:  Normocephalic and atraumatic. Respiratory:  Normal work of breathing. Cardiovascular:  NAD  Impression/Plan: Jacob Velasquez is here for cataract surgery.  Risks, benefits, limitations, and alternatives regarding cataract surgery have been reviewed with the patient.  Questions have been answered.  All parties agreeable.   Elsie Carmine, MD  02/20/2024, 8:09 AM

## 2024-02-20 NOTE — Discharge Instructions (Signed)

## 2024-02-20 NOTE — Anesthesia Postprocedure Evaluation (Signed)
"   Anesthesia Post Note  Patient: Jacob Velasquez  Procedure(s) Performed: PHACOEMULSIFICATION, CATARACT, WITH IOL INSERTION 19.04 01:30.0 (Right: Eye)  Patient location during evaluation: PACU Anesthesia Type: MAC Level of consciousness: awake and alert Pain management: pain level controlled Vital Signs Assessment: post-procedure vital signs reviewed and stable Respiratory status: spontaneous breathing, nonlabored ventilation, respiratory function stable and patient connected to nasal cannula oxygen Cardiovascular status: blood pressure returned to baseline and stable Postop Assessment: no apparent nausea or vomiting Anesthetic complications: no   No notable events documented.   Last Vitals:  Vitals:   02/20/24 0833 02/20/24 0838  BP: 137/65   Pulse: (!) 50 (!) 53  Resp: (!) 9 10  Temp: 36.6 C 36.6 C  SpO2: 97% 96%    Last Pain:  Vitals:   02/20/24 0838  TempSrc:   PainSc: 0-No pain                 Fairy A Elisabeth Strom      "

## 2024-02-20 NOTE — Anesthesia Preprocedure Evaluation (Signed)
"                                    Anesthesia Evaluation  Patient identified by MRN, date of birth, ID band Patient awake    Reviewed: Allergy & Precautions, H&P , NPO status , Patient's Chart, lab work & pertinent test results  Airway Mallampati: II  TM Distance: >3 FB Neck ROM: Full    Dental no notable dental hx. (+) Teeth Intact   Pulmonary neg pulmonary ROS, former smoker   Pulmonary exam normal breath sounds clear to auscultation       Cardiovascular hypertension, negative cardio ROS Normal cardiovascular exam Rhythm:Regular Rate:Normal     Neuro/Psych negative neurological ROS  negative psych ROS   GI/Hepatic negative GI ROS, Neg liver ROS,,,  Endo/Other  negative endocrine ROS    Renal/GU negative Renal ROS  negative genitourinary   Musculoskeletal negative musculoskeletal ROS (+)    Abdominal   Peds negative pediatric ROS (+)  Hematology negative hematology ROS (+)   Anesthesia Other Findings   Reproductive/Obstetrics negative OB ROS                              Anesthesia Physical Anesthesia Plan  ASA: 3  Anesthesia Plan: MAC   Post-op Pain Management:    Induction: Intravenous  PONV Risk Score and Plan:   Airway Management Planned:   Additional Equipment:   Intra-op Plan:   Post-operative Plan: Extubation in OR  Informed Consent: I have reviewed the patients History and Physical, chart, labs and discussed the procedure including the risks, benefits and alternatives for the proposed anesthesia with the patient or authorized representative who has indicated his/her understanding and acceptance.     Dental advisory given  Plan Discussed with: CRNA  Anesthesia Plan Comments:         Anesthesia Quick Evaluation  "

## 2024-02-20 NOTE — Transfer of Care (Signed)
 Immediate Anesthesia Transfer of Care Note  Patient: Jacob Velasquez  Procedure(s) Performed: PHACOEMULSIFICATION, CATARACT, WITH IOL INSERTION 19.04 01:30.0 (Right: Eye)  Patient Location: PACU  Anesthesia Type: MAC  Level of Consciousness: awake, alert  and patient cooperative  Airway and Oxygen Therapy: Patient Spontanous Breathing and Patient connected to supplemental oxygen  Post-op Assessment: Post-op Vital signs reviewed, Patient's Cardiovascular Status Stable, Respiratory Function Stable, Patent Airway and No signs of Nausea or vomiting  Post-op Vital Signs: Reviewed and stable  Complications: No notable events documented.

## 2024-02-20 NOTE — Op Note (Signed)
 PREOPERATIVE DIAGNOSIS:  Nuclear sclerotic cataract of the right eye.   POSTOPERATIVE DIAGNOSIS:  Right Eye Cataract   OPERATIVE PROCEDURE:ORPROCALL@   SURGEON:  Elsie Carmine, MD.   ANESTHESIA:  Anesthesiologist: Maggioncalda, Fairy LABOR, MD CRNA: Jahoo, Sonia, CRNA  1.      Managed anesthesia care. 2.      0.53ml of Shugarcaine was instilled in the eye following the paracentesis.   COMPLICATIONS:  None.   TECHNIQUE:   Stop and chop   DESCRIPTION OF PROCEDURE:  The patient was examined and consented in the preoperative holding area where the aforementioned topical anesthesia was applied to the right eye and then brought back to the Operating Room where the right eye was prepped and draped in the usual sterile ophthalmic fashion and a lid speculum was placed. A paracentesis was created with the side port blade and the anterior chamber was filled with viscoelastic. A near clear corneal incision was performed with the steel keratome. A continuous curvilinear capsulorrhexis was performed with a cystotome followed by the capsulorrhexis forceps. Hydrodissection and hydrodelineation were carried out with BSS on a blunt cannula. The lens was removed in a stop and chop  technique and the remaining cortical material was removed with the irrigation-aspiration handpiece. The capsular bag was inflated with viscoelastic and the intraocular lens was placed in the capsular bag without complication. The remaining viscoelastic was removed from the eye with the irrigation-aspiration handpiece. The wounds were hydrated. The anterior chamber was flushed with BSS and the eye was inflated to physiologic pressure. 0.79ml of Vigamox  was placed in the anterior chamber. The wounds were found to be water tight. The eye was dressed with Combigan . The patient was given protective glasses to wear throughout the day and a shield with which to sleep tonight. The patient was also given drops with which to begin a drop regimen  today and will follow-up with me in one day. Implant Name Type Inv. Item Serial No. Manufacturer Lot No. LRB No. Used Action  LENS IOL TECNIS EYHANCE 20.0 - [id number] Intraocular Lens LENS IOL TECNIS EYHANCE 20.0 [id number] [employer]  Right 1 Implanted   Procedures: PHACOEMULSIFICATION, CATARACT, WITH IOL INSERTION 19.04 01:30.0 (Right)  Electronically signed: Elsie Carmine 02/20/2024 8:32 AM

## 2024-03-01 NOTE — Discharge Instructions (Signed)

## 2024-03-05 ENCOUNTER — Ambulatory Visit: Payer: Self-pay | Admitting: Anesthesiology

## 2024-03-05 ENCOUNTER — Encounter: Payer: Self-pay | Admitting: Ophthalmology

## 2024-03-05 ENCOUNTER — Ambulatory Visit
Admission: RE | Admit: 2024-03-05 | Discharge: 2024-03-05 | Disposition: A | Attending: Ophthalmology | Admitting: Ophthalmology

## 2024-03-05 ENCOUNTER — Other Ambulatory Visit: Payer: Self-pay

## 2024-03-05 ENCOUNTER — Encounter
Admission: RE | Disposition: A | Discharge status: Home / Self Care | Payer: Self-pay | Source: Home / Self Care | Attending: Ophthalmology

## 2024-03-05 DIAGNOSIS — J449 Chronic obstructive pulmonary disease, unspecified: Secondary | ICD-10-CM | POA: Insufficient documentation

## 2024-03-05 DIAGNOSIS — H2512 Age-related nuclear cataract, left eye: Secondary | ICD-10-CM | POA: Insufficient documentation

## 2024-03-05 DIAGNOSIS — Z87891 Personal history of nicotine dependence: Secondary | ICD-10-CM | POA: Insufficient documentation

## 2024-03-05 DIAGNOSIS — I10 Essential (primary) hypertension: Secondary | ICD-10-CM | POA: Insufficient documentation

## 2024-03-05 DIAGNOSIS — Z8673 Personal history of transient ischemic attack (TIA), and cerebral infarction without residual deficits: Secondary | ICD-10-CM | POA: Insufficient documentation

## 2024-03-05 MED ORDER — FENTANYL CITRATE (PF) 100 MCG/2ML IJ SOLN
INTRAMUSCULAR | Status: DC | PRN
  Administered 2024-03-05 (×2): 50 ug via INTRAVENOUS

## 2024-03-05 MED ORDER — SIGHTPATH DOSE#1 NA CHONDROIT SULF-NA HYALURON 40-17 MG/ML IO SOLN
INTRAOCULAR | Status: DC | PRN
  Administered 2024-03-05: 1 mL via INTRAOCULAR

## 2024-03-05 MED ORDER — CYCLOPENTOLATE HCL 2 % OP SOLN
1.0000 [drp] | OPHTHALMIC | Status: AC | PRN
  Administered 2024-03-05 (×3): 1 [drp] via OPHTHALMIC

## 2024-03-05 MED ORDER — PHENYLEPHRINE HCL 10 % OP SOLN
1.0000 [drp] | OPHTHALMIC | Status: AC | PRN
  Administered 2024-03-05 (×3): 1 [drp] via OPHTHALMIC

## 2024-03-05 MED ORDER — LIDOCAINE HCL (PF) 2 % IJ SOLN
INTRAOCULAR | Status: DC | PRN
  Administered 2024-03-05: 2 mL

## 2024-03-05 MED ORDER — CYCLOPENTOLATE HCL 2 % OP SOLN
OPHTHALMIC | Status: AC
  Filled 2024-03-05: qty 2

## 2024-03-05 MED ORDER — LACTATED RINGERS IV SOLN: INTRAVENOUS | Status: DC

## 2024-03-05 MED ORDER — TETRACAINE HCL 0.5 % OP SOLN
1.0000 [drp] | OPHTHALMIC | Status: DC | PRN
  Administered 2024-03-05 (×3): 1 [drp] via OPHTHALMIC

## 2024-03-05 MED ORDER — MOXIFLOXACIN HCL 0.5 % OP SOLN
OPHTHALMIC | Status: DC | PRN
  Administered 2024-03-05: .2 mL via OPHTHALMIC

## 2024-03-05 MED ORDER — BRIMONIDINE TARTRATE-TIMOLOL 0.2-0.5 % OP SOLN
OPHTHALMIC | Status: DC | PRN
  Administered 2024-03-05: 1 [drp] via OPHTHALMIC

## 2024-03-05 MED ORDER — MIDAZOLAM HCL 2 MG/2ML IJ SOLN
INTRAMUSCULAR | Status: AC
  Filled 2024-03-05: qty 2

## 2024-03-05 MED ORDER — PHENYLEPHRINE HCL 10 % OP SOLN
OPHTHALMIC | Status: AC
  Filled 2024-03-05: qty 5

## 2024-03-05 MED ORDER — TETRACAINE HCL 0.5 % OP SOLN
OPHTHALMIC | Status: AC
  Filled 2024-03-05: qty 4

## 2024-03-05 MED ORDER — SIGHTPATH DOSE#1 BSS IO SOLN
INTRAOCULAR | Status: DC | PRN
  Administered 2024-03-05: 51 mL via OPHTHALMIC

## 2024-03-05 MED ORDER — MIDAZOLAM HCL (PF) 2 MG/2ML IJ SOLN
INTRAMUSCULAR | Status: DC | PRN
  Administered 2024-03-05 (×2): 1 mg via INTRAVENOUS

## 2024-03-05 MED ORDER — FENTANYL CITRATE (PF) 100 MCG/2ML IJ SOLN
INTRAMUSCULAR | Status: AC
  Filled 2024-03-05: qty 2

## 2024-03-05 MED ORDER — SIGHTPATH DOSE#1 BSS IO SOLN
INTRAOCULAR | Status: DC | PRN
  Administered 2024-03-05: 15 mL via INTRAOCULAR

## 2024-03-05 NOTE — Transfer of Care (Signed)
 Immediate Anesthesia Transfer of Care Note  Patient: Jacob Velasquez  Procedure(s) Performed: PHACOEMULSIFICATION, CATARACT, WITH IOL INSERTION 14.64 01:09.0 (Left: Eye)  Patient Location: PACU  Anesthesia Type: General  Level of Consciousness: awake, alert  and patient cooperative  Airway and Oxygen Therapy: Patient Spontanous Breathing and Patient connected to supplemental oxygen  Post-op Assessment: Post-op Vital signs reviewed, Patient's Cardiovascular Status Stable, Respiratory Function Stable, Patent Airway and No signs of Nausea or vomiting  Post-op Vital Signs: Reviewed and stable  Complications: No notable events documented.

## 2024-03-05 NOTE — Op Note (Signed)
 PREOPERATIVE DIAGNOSIS:  Nuclear sclerotic cataract of the left eye.   POSTOPERATIVE DIAGNOSIS:  Nuclear sclerotic cataract of the left eye.   OPERATIVE PROCEDURE:ORPROCALL@   SURGEON:  Jacob Carmine, MD.   ANESTHESIA:  Anesthesiologist: Kradel, Brian K, MD CRNA: Jahoo, Sonia, CRNA  1.      Managed anesthesia care. 2.     0.26ml of Shugarcaine was instilled following the paracentesis   COMPLICATIONS:  None.   TECHNIQUE:   Stop and chop   DESCRIPTION OF PROCEDURE:  The patient was examined and consented in the preoperative holding area where the aforementioned topical anesthesia was applied to the left eye and then brought back to the Operating Room where the left eye was prepped and draped in the usual sterile ophthalmic fashion and a lid speculum was placed. A paracentesis was created with the side port blade and the anterior chamber was filled with viscoelastic. A near clear corneal incision was performed with the steel keratome. A continuous curvilinear capsulorrhexis was performed with a cystotome followed by the capsulorrhexis forceps. Hydrodissection and hydrodelineation were carried out with BSS on a blunt cannula. The lens was removed in a stop and chop  technique and the remaining cortical material was removed with the irrigation-aspiration handpiece. The capsular bag was inflated with viscoelastic and the intraocular lens was placed in the capsular bag without complication. The remaining viscoelastic was removed from the eye with the irrigation-aspiration handpiece. The wounds were hydrated. The anterior chamber was flushed with BSS and the eye was inflated to physiologic pressure. 0.17ml Vigamox  was placed in the anterior chamber. The wounds were found to be water tight. The eye was dressed with Combigan . The patient was given protective glasses to wear throughout the day and a shield with which to sleep tonight. The patient was also given drops with which to begin a drop regimen  today and will follow-up with me in one day. Implant Name Type Inv. Item Serial No. Manufacturer Lot No. LRB No. Used Action  LENS IOL TECNIS EYHANCE 20.0 - [id number] Intraocular Lens LENS IOL TECNIS EYHANCE 20.0 [id number] [employer]  Left 1 Implanted    Procedures: PHACOEMULSIFICATION, CATARACT, WITH IOL INSERTION 14.64 01:09.0 (Left)  Electronically signed: Elsie Velasquez 03/05/2024 10:05 AM

## 2024-03-08 ENCOUNTER — Encounter: Payer: Self-pay | Admitting: Ophthalmology
